# Patient Record
Sex: Male | Born: 1975 | Race: White | Hispanic: No | Marital: Married | Smoking: Never smoker
Health system: Southern US, Community
[De-identification: ages and names within clinical notes are randomized; demographics above are authoritative.]

## PROBLEM LIST (undated history)

## (undated) DIAGNOSIS — F988 Other specified behavioral and emotional disorders with onset usually occurring in childhood and adolescence: Secondary | ICD-10-CM

## (undated) DIAGNOSIS — F1020 Alcohol dependence, uncomplicated: Secondary | ICD-10-CM

## (undated) DIAGNOSIS — E291 Testicular hypofunction: Secondary | ICD-10-CM

## (undated) DIAGNOSIS — J3081 Allergic rhinitis due to animal (cat) (dog) hair and dander: Secondary | ICD-10-CM

## (undated) DIAGNOSIS — J40 Bronchitis, not specified as acute or chronic: Secondary | ICD-10-CM

## (undated) DIAGNOSIS — R569 Unspecified convulsions: Secondary | ICD-10-CM

## (undated) DIAGNOSIS — T7840XA Allergy, unspecified, initial encounter: Secondary | ICD-10-CM

## (undated) DIAGNOSIS — N469 Male infertility, unspecified: Secondary | ICD-10-CM

## (undated) DIAGNOSIS — I951 Orthostatic hypotension: Secondary | ICD-10-CM

## (undated) DIAGNOSIS — R45851 Suicidal ideations: Secondary | ICD-10-CM

## (undated) DIAGNOSIS — K859 Acute pancreatitis without necrosis or infection, unspecified: Secondary | ICD-10-CM

## (undated) DIAGNOSIS — G4730 Sleep apnea, unspecified: Secondary | ICD-10-CM

## (undated) DIAGNOSIS — F329 Major depressive disorder, single episode, unspecified: Secondary | ICD-10-CM

## (undated) DIAGNOSIS — K219 Gastro-esophageal reflux disease without esophagitis: Secondary | ICD-10-CM

## (undated) DIAGNOSIS — F32A Depression, unspecified: Secondary | ICD-10-CM

## (undated) DIAGNOSIS — R519 Headache, unspecified: Secondary | ICD-10-CM

## (undated) DIAGNOSIS — F40243 Fear of flying: Secondary | ICD-10-CM

## (undated) DIAGNOSIS — R7303 Prediabetes: Secondary | ICD-10-CM

## (undated) DIAGNOSIS — R002 Palpitations: Secondary | ICD-10-CM

## (undated) HISTORY — DX: Allergic rhinitis due to animal (cat) (dog) hair and dander: J30.81

## (undated) HISTORY — PX: EYE SURGERY: SHX253

## (undated) HISTORY — DX: Male infertility, unspecified: N46.9

## (undated) HISTORY — DX: Allergy, unspecified, initial encounter: T78.40XA

## (undated) HISTORY — PX: FRACTURE SURGERY: SHX138

## (undated) HISTORY — DX: Depression, unspecified: F32.A

## (undated) HISTORY — DX: Testicular hypofunction: E29.1

## (undated) HISTORY — DX: Acute pancreatitis without necrosis or infection, unspecified: K85.90

## (undated) HISTORY — DX: Fear of flying: F40.243

## (undated) HISTORY — DX: Prediabetes: R73.03

## (undated) HISTORY — DX: Major depressive disorder, single episode, unspecified: F32.9

## (undated) HISTORY — PX: UPPER GASTROINTESTINAL ENDOSCOPY: SHX188

## (undated) HISTORY — DX: Palpitations: R00.2

## (undated) HISTORY — DX: Orthostatic hypotension: I95.1

## (undated) HISTORY — DX: Morbid (severe) obesity due to excess calories: E66.01

---

## 2012-01-25 ENCOUNTER — Ambulatory Visit (INDEPENDENT_AMBULATORY_CARE_PROVIDER_SITE_OTHER): Payer: BC Managed Care – PPO | Admitting: Family Medicine

## 2012-01-25 ENCOUNTER — Encounter: Payer: Self-pay | Admitting: Family Medicine

## 2012-01-25 ENCOUNTER — Other Ambulatory Visit: Payer: Self-pay | Admitting: Family Medicine

## 2012-01-25 VITALS — BP 133/88 | HR 98 | Temp 98.8°F | Ht 73.0 in | Wt 305.0 lb

## 2012-01-25 DIAGNOSIS — N469 Male infertility, unspecified: Secondary | ICD-10-CM

## 2012-01-25 DIAGNOSIS — Z23 Encounter for immunization: Secondary | ICD-10-CM

## 2012-01-25 DIAGNOSIS — Z0000 Encounter for general adult medical examination without abnormal findings: Secondary | ICD-10-CM

## 2012-01-25 NOTE — Progress Notes (Signed)
Office Note 01/28/2012  CC:  Chief Complaint  Patient presents with  . Establish Care    conception issues    HPI:  Philip Daugherty is a 36 y.o. White male who is here to establish care and discuss conception problems/abnl semen analysis done 01/04/12. Patient's most recent primary MD: none Old records were (semen analysis that pt brought) reviewed prior to or during today's visit.  He denies any physical complaints. He is currently in his second marriage and he and his wife have been trying to get pregnant for 6-7 months now.  He and his first wife had no children and he says a sperm analysis he got when married to her showed low volume but he can't think of anything else.  He says she has since remarried and had a child with current husband.   Denies hx of injury to testicles, no known hx of testicular infection, no FH of male infertility. He worked on Agricultural consultant in [employer] but says whenever he was tested there he never showed high levels of radiation exposure.  Past Medical History  Diagnosis Date  . Asthma     childhood  . Depression     wellbutrin helped in the past  . Syncope due to orthostatic hypotension     d/c'd from [employer] due to this.  Says if Wt 235 lbs or more then this is not a problem  . Allergy to cats     takes claritin  . Palpitations     24h Holter.: sinus tachycardia  . Fear of flying     +insomnia; was on xanax regulary for long period and then abruptly stopped it and had w/drawal seizures.    History reviewed. No pertinent past surgical history.  Family History  Problem Relation Age of Onset  . Depression Mother     bipolar disorder  . Parkinsonism Mother   . Diabetes Father     History   Social History  . Marital Status: Married    Spouse Name: N/A    Number of Children: N/A  . Years of Education: N/A   Occupational History  . Not on file.   Social History Main Topics  . Smoking status: Never Smoker   . Smokeless tobacco: Never  Used  . Alcohol Use: Yes     has decreased since 01/04/12  . Drug Use: No  . Sexually Active: Not on file   Other Topics Concern  . Not on file   Social History Narrative   Married, second marriage.  No children.Orig from [place], spent time in [place], college in [place].Relocated from [place] to [place] again summer 2012, works as IT sales professional for [employer].No tobacco.  Alcohol: 3-4 bourbon drinks most days, cut back as of 12/2011.Exercise: sporadic (elliptical, walking).  No hx of drug use/abuse.    Outpatient Encounter Prescriptions as of 01/25/2012  Medication Sig Dispense Refill  . diphenhydrAMINE (BENADRYL) 50 MG capsule Take 50-100 mg by mouth at bedtime as needed.      . loratadine (CLARITIN) 10 MG tablet Take 10 mg by mouth daily as needed.        No Known Allergies  ROS Review of Systems  Constitutional: Negative for fever, chills, appetite change and fatigue.  HENT: Negative for ear pain, congestion, sore throat, neck stiffness and dental problem.   Eyes: Negative for discharge, redness and visual disturbance.  Respiratory: Negative for cough, chest tightness, shortness of breath and wheezing.   Cardiovascular: Negative for chest  pain, palpitations and leg swelling.  Gastrointestinal: Negative for nausea, vomiting, abdominal pain, diarrhea and blood in stool.  Genitourinary: Negative for dysuria, urgency, frequency, hematuria, flank pain and difficulty urinating.  Musculoskeletal: Negative for myalgias, back pain, joint swelling and arthralgias.  Skin: Negative for pallor and rash.  Neurological: Negative for dizziness, speech difficulty, weakness and headaches.  Hematological: Negative for adenopathy. Does not bruise/bleed easily.  Psychiatric/Behavioral: Negative for confusion and sleep disturbance. The patient is not nervous/anxious.     PE; Blood pressure 133/88, pulse 98, temperature 98.8 F (37.1 C), temperature source Temporal, height 6\' 1"  (1.854 m), weight 305  lb (138.347 kg), SpO2 96.00%. Gen: Alert, well appearing, obese white male.  Patient is oriented to person, place, time, and situation. ENT: Ears: EACs clear, normal epithelium.  TMs with good light reflex and landmarks bilaterally.  Eyes: no injection, icteris, swelling, or exudate.  EOMI, PERRLA. Nose: no drainage or turbinate edema/swelling.  No injection or focal lesion.  Mouth: lips without lesion/swelling.  Oral mucosa pink and moist.  Dentition intact and without obvious caries or gingival swelling.  Oropharynx without erythema, exudate, or swelling.  Neck - No masses or thyromegaly or limitation in range of motion CV: RRR, no m/r/g.   LUNGS: CTA bilat, nonlabored resps, good aeration in all lung fields. ABD: soft, NT, ND, BS normal.  No hepatospenomegaly or mass.  No bruits. EXT: no clubbing, cyanosis, or edema.  Genitals normal; both testes normal without tenderness, masses, hydroceles, varicoceles, erythema or swelling. Shaft normal, circumcised, meatus normal without discharge. No inguinal hernia noted. No inguinal lymphadenopathy.  Pertinent labs:  Semen analysis 01/04/12 at [facility] in [place] showed low volume, low concentration, low motility, low total count, and non-progressive motility.  He had 3% normal forms, 38% head defects, 33% mid piece defects, and 26% tail defects.    ASSESSMENT AND PLAN:   Infertility male Check testosterone level, LH level. Will send test results from today + semen analysis to a urologist and have him seen by them for further e/m. Referral ordered today.  Preventative health care Tdap given today.     Return for return as needed.

## 2012-01-28 ENCOUNTER — Encounter: Payer: Self-pay | Admitting: Family Medicine

## 2012-01-28 DIAGNOSIS — N469 Male infertility, unspecified: Secondary | ICD-10-CM | POA: Insufficient documentation

## 2012-01-28 DIAGNOSIS — Z0000 Encounter for general adult medical examination without abnormal findings: Secondary | ICD-10-CM | POA: Insufficient documentation

## 2012-01-28 DIAGNOSIS — E291 Testicular hypofunction: Secondary | ICD-10-CM | POA: Insufficient documentation

## 2012-01-28 LAB — PROLACTIN: Prolactin: 8.4 ng/mL (ref 2.1–17.1)

## 2012-01-28 NOTE — Assessment & Plan Note (Signed)
Check testosterone level, LH level. Will send test results from today + semen analysis to a urologist and have him seen by them for further e/m. Referral ordered today.

## 2012-01-28 NOTE — Assessment & Plan Note (Signed)
Tdap given today.

## 2012-05-27 ENCOUNTER — Encounter: Payer: Self-pay | Admitting: Family Medicine

## 2012-08-06 ENCOUNTER — Encounter: Payer: Self-pay | Admitting: Family Medicine

## 2012-08-06 ENCOUNTER — Ambulatory Visit (INDEPENDENT_AMBULATORY_CARE_PROVIDER_SITE_OTHER): Payer: BC Managed Care – PPO | Admitting: Family Medicine

## 2012-08-06 VITALS — BP 130/88 | HR 93 | Temp 98.3°F | Ht 73.0 in | Wt 314.0 lb

## 2012-08-06 DIAGNOSIS — J45909 Unspecified asthma, uncomplicated: Secondary | ICD-10-CM | POA: Insufficient documentation

## 2012-08-06 DIAGNOSIS — J209 Acute bronchitis, unspecified: Secondary | ICD-10-CM

## 2012-08-06 MED ORDER — PREDNISONE 20 MG PO TABS: ORAL_TABLET | ORAL | Status: DC

## 2012-08-06 MED ORDER — ALBUTEROL SULFATE HFA 108 (90 BASE) MCG/ACT IN AERS: 2.0000 | INHALATION_SPRAY | Freq: Four times a day (QID) | RESPIRATORY_TRACT | Status: DC | PRN

## 2012-08-06 MED ORDER — HYDROCODONE-HOMATROPINE 5-1.5 MG/5ML PO SYRP: ORAL_SOLUTION | ORAL | Status: DC

## 2012-08-06 NOTE — Progress Notes (Signed)
OFFICE NOTE  08/06/2012  CC:  Chief Complaint  Patient presents with  . URI    ? bronchitis, roadtrip to [place], symptoms began Saturday; cough, nasal congestion, chest tightness     HPI: Patient is a 36 y.o. Caucasian male who is here for respiratory symptoms. Pt presents complaining of respiratory symptoms for 5  days.  Primary symptoms are: nasal cong/runny nose, cough.  Worst symptoms seems to be the cough/chest tightness.  Lately the symptoms seem to be worsening.  He feels short of breath. Pertinent negatives: No fevers, no wheezing.  No pain in face or teeth.  No significant HA.  ST mild at most.   Symptoms made worse by being active.  Symptoms improved by laying down. Smoker? no Recent sick contact? No known  Muscle or joint aches? no Flu shot this season at least 2 wks ago? no  Additional ROS: no n/v/d or abdominal pain.  No rash.  No neck stiffness.   +Mild fatigue.  +Mild appetite loss.   Pertinent PMH:  Past Medical History  Diagnosis Date  . Asthma     childhood  . Depression     wellbutrin helped in the past  . Syncope due to orthostatic hypotension     d/c'd from [employer] due to this.  Says if Wt 235 lbs or more then this is not a problem  . Allergy to cats     takes claritin  . Palpitations     24h Holter.: sinus tachycardia  . Fear of flying     +insomnia; was on xanax regulary for long period and then abruptly stopped it and had w/drawal seizures.  . 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 Cassell Smiles)  . Hypogonadism male     MEDS:  Outpatient Prescriptions Prior to Visit  Medication Sig Dispense Refill  . diphenhydrAMINE (BENADRYL) 50 MG capsule Take 50-100 mg by mouth at bedtime as needed.      . loratadine (CLARITIN) 10 MG tablet Take 10 mg by mouth daily as needed.        PE: Blood pressure 130/88, pulse 93, temperature 98.3 F (36.8 C), temperature source Temporal, height 6\' 1"  (1.854 m), weight  314 lb (142.429 kg). VS: noted--normal. Gen: alert, NAD, NONTOXIC APPEARING. HEENT: eyes without injection, drainage, or swelling.  Ears: EACs clear, TMs with normal light reflex and landmarks.  Nose: Clear rhinorrhea, with some dried, crusty exudate adherent to mildly injected mucosa.  No purulent d/c.  No paranasal sinus TTP.  No facial swelling.  Throat and mouth without focal lesion.  No pharyngial swelling, erythema, or exudate.   Neck: supple, no LAD.   LUNGS: Diffuse insp rhonchi and trace exp wheezing.  Poor aeration on exp, with significant post-exhalation coughing. CV: Regular rhythm, tachy to 100-110, no m/r/g. EXT: no c/c/e SKIN: no rash  LAB: none today.  IMPRESSION AND PLAN:  Acute bronchitis Albuterol 2.5mg /atrovent 0.5mg  nebulizer treatment given today in office: improved aeration and less coughing after this.  HR down in the 80s after neb. Prednisone 40mg  qd x 5d, then 20mg  qd x 5d. Proair HFA 1-2 puffs q4-6h prn. Recommended nonsedating antihistamine +/- decongestant OTC prn for upper resp sx's. Hycodan 1-2 tsp q6h prn cough, #136ml, no RF.  Encouraged pt to return for flu vaccine when he is feeling well again.  FOLLOW UP: prn

## 2012-08-06 NOTE — Assessment & Plan Note (Addendum)
Albuterol 2.5mg /atrovent 0.5mg  nebulizer treatment given today in office: improved aeration and less coughing after this.  HR down in the 80s after neb. Prednisone 40mg  qd x 5d, then 20mg  qd x 5d. Proair HFA 1-2 puffs q4-6h prn. Recommended nonsedating antihistamine +/- decongestant OTC prn for upper resp sx's. Hycodan 1-2 tsp q6h prn cough, #153ml, no RF.

## 2012-08-08 ENCOUNTER — Other Ambulatory Visit: Payer: Self-pay | Admitting: *Deleted

## 2012-08-08 ENCOUNTER — Other Ambulatory Visit: Payer: Self-pay | Admitting: Family Medicine

## 2012-08-08 DIAGNOSIS — J209 Acute bronchitis, unspecified: Secondary | ICD-10-CM

## 2012-08-08 MED ORDER — HYDROCODONE-HOMATROPINE 5-1.5 MG/5ML PO SYRP: ORAL_SOLUTION | ORAL | Status: DC

## 2012-08-08 NOTE — Telephone Encounter (Signed)
RX faxed

## 2012-08-08 NOTE — Telephone Encounter (Signed)
Pt states his bronchitis is slowly getting better.  He has been using cough syrup and is having relief from cough.  Pt only has a little bit left and would like refill.  Please advise.

## 2012-08-11 ENCOUNTER — Telehealth: Payer: Self-pay | Admitting: *Deleted

## 2012-08-11 NOTE — Telephone Encounter (Addendum)
Pt is still having symptoms of bronchitis.  Symptoms are not getting worse, but he is still having the cough.  Cough is worse while he is up and moving.  He is OK laying flat or when he is using cough syrup.  He is using 120 mL every 2.5 days.  Pt would like refill on cough syrup and would like to know if he needs to continue prednisone at higher dose.   Please advise.

## 2012-08-11 NOTE — Telephone Encounter (Signed)
I would like him to d/c hycodan cough syrup and start tessalon perles 200mg  q8h prn, #30, RF x 1. I would like him to continue the steroids like I rx'd earlier--no increased dose recommended at this time. Make sure he is doing his ProAir inhaler 2 puffs qid prn--this will help significantly if he is not already doing this. I'll also add antibiotic: azithromycin 250mg , 2 tabs po qd x 1d, then 1 tab po qd x 4d, #6, no RF. Pls do eRx's for the new meds listed above.  -thx

## 2012-08-12 MED ORDER — BENZONATATE 200 MG PO CAPS
200.0000 mg | ORAL_CAPSULE | Freq: Three times a day (TID) | ORAL | Status: AC | PRN
Start: 2012-08-12 — End: 2012-09-12

## 2012-08-12 MED ORDER — AZITHROMYCIN 250 MG PO TABS: ORAL_TABLET | ORAL | Status: AC

## 2012-08-12 NOTE — Telephone Encounter (Signed)
Pt notified and RX's sent.

## 2012-08-12 NOTE — Telephone Encounter (Signed)
I have attempted to contact this patient by phone with the following results: left message to return my call on answering machine (home/mobile).  

## 2012-09-24 ENCOUNTER — Encounter: Payer: Self-pay | Admitting: Family Medicine

## 2012-09-24 ENCOUNTER — Ambulatory Visit (INDEPENDENT_AMBULATORY_CARE_PROVIDER_SITE_OTHER): Payer: BC Managed Care – PPO | Admitting: Family Medicine

## 2012-09-24 VITALS — BP 136/103 | HR 75 | Temp 98.2°F | Ht 73.0 in | Wt 310.0 lb

## 2012-09-24 DIAGNOSIS — J45909 Unspecified asthma, uncomplicated: Secondary | ICD-10-CM

## 2012-09-24 MED ORDER — LEVALBUTEROL TARTRATE 45 MCG/ACT IN AERO: 1.0000 | INHALATION_SPRAY | Freq: Four times a day (QID) | RESPIRATORY_TRACT | Status: DC | PRN

## 2012-09-24 MED ORDER — AZITHROMYCIN 250 MG PO TABS: ORAL_TABLET | ORAL | Status: DC

## 2012-09-24 MED ORDER — BECLOMETHASONE DIPROPIONATE 80 MCG/ACT IN AERS: 2.0000 | INHALATION_SPRAY | Freq: Two times a day (BID) | RESPIRATORY_TRACT | Status: DC

## 2012-09-24 MED ORDER — PREDNISONE 20 MG PO TABS: ORAL_TABLET | ORAL | Status: DC

## 2012-09-24 MED ORDER — HYDROCODONE-HOMATROPINE 5-1.5 MG/5ML PO SYRP: ORAL_SOLUTION | ORAL | Status: DC

## 2012-09-24 NOTE — Progress Notes (Signed)
OFFICE NOTE  09/24/2012  CC:  Chief Complaint  Patient presents with  . URI    chest congestion, taking Mucinex and robitussin without relief     HPI: Patient is a 36 y.o. Caucasian male who is here for ongoing respiratory complaints. I saw approx 2 mo ago for acute asthmatic bronchitis and treated him with a steroid taper and started him on ProAir HFA.  He improved but did not get completely well.  He then tried to do a cardio workout in the last week or so and ever since then he has felt worse--cough, chest tightness/heaviness, wheezing, mild SOB, lots of rhinorrhea.  NO fever, no face pain or HA, no hemoptysis.  The proair makes him feel too jittery and he can't sleep if he takes it hs, also causes nauseated feeling--mild, no vomiting.  He has been using it only rarely due to these side effects.  He is not a smoker, nor does he live with a smoker.  No others in his home with a cough/resp illness.  He has had his flu vaccine this season.  Pertinent PMH:  Past Medical History  Diagnosis Date  . Asthma     childhood  . Depression     wellbutrin helped in the past  . Syncope due to orthostatic hypotension     d/c'd from [employer] due to this.  Says if Wt 235 lbs or more then this is not a problem  . Allergy to cats     takes claritin  . Palpitations     24h Holter.: sinus tachycardia  . Fear of flying     +insomnia; was on xanax regulary for long period and then abruptly stopped it and had w/drawal seizures.  . 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 Cassell Smiles)  . Hypogonadism male     MEDS:  Outpatient Prescriptions Prior to Visit  Medication Sig Dispense Refill  . albuterol (PROAIR HFA) 108 (90 BASE) MCG/ACT inhaler Inhale 2 puffs into the lungs every 6 (six) hours as needed for wheezing.  1 Inhaler  1  . clomiPHENE (CLOMID) 50 MG tablet Take 50 mg by mouth daily.      . diphenhydrAMINE (BENADRYL) 50 MG capsule Take 50-100 mg by  mouth at bedtime as needed.      . loratadine (CLARITIN) 10 MG tablet Take 10 mg by mouth daily as needed.      . [DISCONTINUED] predniSONE (DELTASONE) 20 MG tablet 2 tabs po qd x 5d, then 1 tab po qd x 5 d  15 tablet  0   Last reviewed on 09/24/2012 11:23 AM by Jeoffrey Massed, MD  PE: Blood pressure 136/103, pulse 75, temperature 98.2 F (36.8 C), temperature source Temporal, height 6\' 1"  (1.854 m), weight 310 lb (140.615 kg). Gen: Alert, well appearing.  Patient is oriented to person, place, time, and situation. VS: noted--normal except diastolic bp a little high. HEENT: eyes without injection, drainage, or swelling.  Ears: EACs clear, TMs with normal light reflex and landmarks.  Nose: Clear rhinorrhea, with some dried, crusty exudate adherent to mildly injected mucosa.  No purulent d/c.  No paranasal sinus TTP.  No facial swelling.  Throat and mouth without focal lesion.  No pharyngial swelling, erythema, or exudate.   Neck: supple, no LAD.   LUNGS: diffuse insp rhonchi and exp wheezing that does not clear with coughing.  +Excessive coughing when exhaling forcefully, aeration mildly diminished, exp phase mildly prolonged,  nonlabored resps.   CV: RRR, no m/r/g. EXT: no c/c/e SKIN: no rash    IMPRESSION AND PLAN:  Asthmatic bronchitis Prednisone taper: 60 x 5, 40 x 5, 20 x 5, 10 x 5, then stop. Azithromycin x 5d. Start inhaled steroid: QVAR 80 mcg, 2 puffs bid. D/c albuterol inhaler due to side effects of tremors, insomnia, and nausea.  Will do trial of xopenex HFA, 2 puffs q6h prn. Continue claritin. Hycodan susp, 1-2 tsp po qhs prn cough, #180ml, no RF. F/u 10-14d.   An After Visit Summary was printed and given to the patient.  FOLLOW UP: 10-14d

## 2012-09-24 NOTE — Assessment & Plan Note (Signed)
Prednisone taper: 60 x 5, 40 x 5, 20 x 5, 10 x 5, then stop. Azithromycin x 5d. Start inhaled steroid: QVAR 80 mcg, 2 puffs bid. D/c albuterol inhaler due to side effects of tremors, insomnia, and nausea.  Will do trial of xopenex HFA, 2 puffs q6h prn. Continue claritin. Hycodan susp, 1-2 tsp po qhs prn cough, #161ml, no RF. F/u 10-14d.

## 2012-10-06 ENCOUNTER — Ambulatory Visit: Payer: BC Managed Care – PPO | Admitting: Family Medicine

## 2012-10-10 ENCOUNTER — Ambulatory Visit: Payer: BC Managed Care – PPO | Admitting: Family Medicine

## 2012-11-29 ENCOUNTER — Other Ambulatory Visit: Payer: Self-pay

## 2013-08-20 ENCOUNTER — Other Ambulatory Visit: Payer: Self-pay

## 2013-10-21 ENCOUNTER — Ambulatory Visit (INDEPENDENT_AMBULATORY_CARE_PROVIDER_SITE_OTHER): Payer: BC Managed Care – PPO | Admitting: Family Medicine

## 2013-10-21 ENCOUNTER — Encounter: Payer: Self-pay | Admitting: Family Medicine

## 2013-10-21 ENCOUNTER — Other Ambulatory Visit: Payer: Self-pay | Admitting: *Deleted

## 2013-10-21 VITALS — BP 134/89 | HR 88 | Temp 99.0°F | Resp 18 | Ht 73.0 in | Wt 318.0 lb

## 2013-10-21 DIAGNOSIS — M25561 Pain in right knee: Secondary | ICD-10-CM | POA: Insufficient documentation

## 2013-10-21 DIAGNOSIS — M25569 Pain in unspecified knee: Secondary | ICD-10-CM

## 2013-10-21 MED ORDER — HYDROCODONE-ACETAMINOPHEN 5-325 MG PO TABS: ORAL_TABLET | ORAL | Status: DC

## 2013-10-21 NOTE — Progress Notes (Signed)
OFFICE NOTE  10/21/2013  CC:  Chief Complaint  Patient presents with  . Knee Pain    right knee x two months      HPI: Patient is a 38 y.o. Caucasian male who is here for right knee pain. Onset about 2 mo ago, right knee started to pop and this is accompanied by pain.  Intensity varies.  Happens most consistently when he plants right foot and turns directions while walking.  Worse when going down stairs.  Location is lateral, sometimes posterolateral region.  No swelling noted.  No hip or back pain. Ice application helps.  Ibuprofen avg of once per day and he doesn't notice that it helps much.  Rest helps it. No injury to knee recalled.    Pertinent PMH:  Past Medical History  Diagnosis Date  . Asthma     childhood  . Depression     wellbutrin helped in the past  . Syncope due to orthostatic hypotension     d/c'd from [employer] due to this.  Says if Wt 235 lbs or more then this is not a problem  . Allergy to cats     takes claritin  . Palpitations     24h Holter.: sinus tachycardia  . Fear of flying     +insomnia; was on xanax regulary for long period and then abruptly stopped it and had w/drawal seizures.  . 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 Era Bumpers)  . Hypogonadism male   . Obesity, morbid, BMI 40.0-49.9    No past surgical history on file.  MEDS:  Outpatient Prescriptions Prior to Visit  Medication Sig Dispense Refill  . clomiPHENE (CLOMID) 50 MG tablet Take 50 mg by mouth daily.      . diphenhydrAMINE (BENADRYL) 50 MG capsule Take 50-100 mg by mouth at bedtime as needed.      . beclomethasone (QVAR) 80 MCG/ACT inhaler Inhale 2 puffs into the lungs 2 (two) times daily.  1 Inhaler  4  . Chorionic Gonadotropin (NOVAREL IM) Inject 1 mL into the muscle 2 (two) times a week.      Marland Kitchen HYDROcodone-homatropine (HYCODAN) 5-1.5 MG/5ML syrup 1-2 tsp po qhs prn cough  120 mL  0  . levalbuterol (XOPENEX HFA) 45 MCG/ACT inhaler  Inhale 1-2 puffs into the lungs every 6 (six) hours as needed for wheezing.  1 Inhaler  0  . loratadine (CLARITIN) 10 MG tablet Take 10 mg by mouth daily as needed.      Marland Kitchen azithromycin (ZITHROMAX) 250 MG tablet 2 tabs po qd x 1d, then 1 tab po qd x 4d  6 each  0  . predniSONE (DELTASONE) 20 MG tablet 3 tabs po qd x 5d, then 2 tabs po qd x 5d, then 1 tab po qd x 5d, then 1/2 tab po qd x 4d, then stop.  32 tablet  0   No facility-administered medications prior to visit.    PE: Blood pressure 134/89, pulse 88, temperature 99 F (37.2 C), temperature source Temporal, resp. rate 18, height 6\' 1"  (1.854 m), weight 318 lb (144.244 kg), SpO2 95.00%. Gen: Alert, well appearing.  Patient is oriented to person, place, time, and situation. No knee swelling, warmth, or erythema. Right knee ROM fully intact, with mild pain at the extremes of flexion and extension. No knee instability.  Patellar grind elicits no pain.  He has some crepitus of his right patella with flexion/extension. Tender to palpation along  lateral joint line of right knee.  McMurray's elicits significant lateral joint line pain, no palpable popping.   IMPRESSION AND PLAN:  Right knee pain x 2 mo. Lateral meniscus injury suspected. Continue ice, rest, and will get rx #20 vicodin 5/325 to use prn severe pain.  May stop NSAID at this point. Will get him to orthopedist as soon as possible for further eval and management.  FOLLOW UP: prn

## 2013-10-21 NOTE — Progress Notes (Signed)
Pre visit review using our clinic review tool, if applicable. No additional management support is needed unless otherwise documented below in the visit note. 

## 2014-03-19 ENCOUNTER — Encounter: Payer: Self-pay | Admitting: Family Medicine

## 2014-03-19 ENCOUNTER — Ambulatory Visit (INDEPENDENT_AMBULATORY_CARE_PROVIDER_SITE_OTHER): Payer: BC Managed Care – PPO | Admitting: Family Medicine

## 2014-03-19 VITALS — BP 129/85 | HR 83 | Temp 98.0°F | Resp 18 | Ht 73.0 in | Wt 316.0 lb

## 2014-03-19 DIAGNOSIS — F411 Generalized anxiety disorder: Secondary | ICD-10-CM

## 2014-03-19 DIAGNOSIS — F418 Other specified anxiety disorders: Secondary | ICD-10-CM | POA: Insufficient documentation

## 2014-03-19 MED ORDER — CLONAZEPAM 2 MG PO TABS: ORAL_TABLET | ORAL | Status: DC

## 2014-03-19 NOTE — Progress Notes (Signed)
OFFICE NOTE  03/19/2014  CC:  Chief Complaint  Patient presents with  . Anxiety    when he travels on the plane for work   HPI: Patient is a 38 y.o. Caucasian male who is here for situational anxiety--fear of flying. Has to fly for his job anywhere from 5-10 times a month, feels tightness in stomach, sweaty, heart racing, no dizziness. This abates when flight is over.  No other significant anxiety in his life that requires prn med. Currently is on no prn med for anxiety.  Xanax use in the past had GI drawbacks.  Pertinent PMH:  Past medical, surgical, social, and family history reviewed and no changes are noted since last office visit.  MEDS:  Claritin qd prn, xopenex HFA prn  PE: Blood pressure 129/85, pulse 83, temperature 98 F (36.7 C), temperature source Oral, resp. rate 18, height 6\' 1"  (1.854 m), weight 316 lb (143.337 kg), SpO2 96.00%. Gen: Alert, well appearing.  Patient is oriented to person, place, time, and situation. AFFECT: pleasant, lucid thought and speech.   IMPRESSION AND PLAN:  Situational anxiety, unavoidable. Wants to get back on prn med due to excessive travel required for work. Trial of clonazepam 1/2-1 tab q12h prn airplane flight, #30, RF x 5. F/u 6 mo. Signs/symptoms to call or return for were reviewed and pt expressed understanding.

## 2014-03-19 NOTE — Progress Notes (Signed)
Pre visit review using our clinic review tool, if applicable. No additional management support is needed unless otherwise documented below in the visit note. 

## 2014-03-24 ENCOUNTER — Telehealth: Payer: Self-pay | Admitting: Family Medicine

## 2014-03-24 NOTE — Telephone Encounter (Signed)
Pt called stating that klonopin doesn't seem to be helping him at 1 PO.   Please advise.

## 2014-03-24 NOTE — Telephone Encounter (Signed)
lmom for pt to CB °

## 2014-03-24 NOTE — Telephone Encounter (Signed)
Tell him to take 2 tabs at a time and see if this is sufficient dosing. Have him call back with report of how this does.-thx

## 2014-03-25 NOTE — Telephone Encounter (Signed)
Patient aware of instructions and will call back to check in about dosing sufficiency.

## 2014-05-28 ENCOUNTER — Ambulatory Visit (INDEPENDENT_AMBULATORY_CARE_PROVIDER_SITE_OTHER): Payer: BC Managed Care – PPO | Admitting: Family Medicine

## 2014-05-28 ENCOUNTER — Encounter: Payer: Self-pay | Admitting: Family Medicine

## 2014-05-28 VITALS — BP 139/94 | HR 86 | Temp 99.2°F | Resp 18 | Ht 73.0 in | Wt 313.0 lb

## 2014-05-28 DIAGNOSIS — F331 Major depressive disorder, recurrent, moderate: Secondary | ICD-10-CM

## 2014-05-28 MED ORDER — DULOXETINE HCL 60 MG PO CPEP: 60.0000 mg | ORAL_CAPSULE | Freq: Every day | ORAL | Status: DC

## 2014-05-28 MED ORDER — CLONAZEPAM 2 MG PO TABS: ORAL_TABLET | ORAL | Status: DC

## 2014-05-28 NOTE — Progress Notes (Signed)
Pre visit review using our clinic review tool, if applicable. No additional management support is needed unless otherwise documented below in the visit note. 

## 2014-05-28 NOTE — Assessment & Plan Note (Signed)
With a high generalized anxiety and social anxiety component to this. He says this is new compared to his episodes of depression in the past. Will do cymbalta 60mg  qd. Recommended he decrease his clonazepam use to once daily and at a maximum dose of ONE 2mg  tab when he does take it.   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

## 2014-05-28 NOTE — Progress Notes (Signed)
OFFICE VISIT  05/28/2014   CC:  Chief Complaint  Patient presents with  . Anxiety  . Depression   HPI:    Patient is a 38 y.o. Caucasian male who presents for discussion of a re-emergence of depression and anxiety. Irritable, tense, social anxiety noted much more lately, and on top of this he is feeling depressed, sad, hopeless. Cites loneliness as a problem, works from home and has lived her for 3 yrs yet doesn't really know anyone. Denies suicidal thoughts or homicidal thoughts but admits he has had thoughts like "who would miss me if I was gone" and "what if I could just end it now".  No plans, no past attempts. Denies hx of euphoric/manic episode in the past but says his mom is bipolar and has had these episodes so he knows what to look for. He does admit that his depression has worsened with use of clonazepam more for his acute anxiety lately.  He states he misunderstood my instructions and has been taking 2 of the 2 mg tabs at one dosing sometimes. Denies panic attacks.  No hallucinations or delusional thinking.  Past Medical History  Diagnosis Date  . Asthma     childhood  . Depression     wellbutrin helped in the past; pt cites failure to respond to prozac, zoloft, and celexa  . Syncope due to orthostatic hypotension     d/c'd from [employer] due to this.  Says if Wt 235 lbs or more then this is not a problem  . Allergy to cats     takes claritin  . Palpitations     24h Holter.: sinus tachycardia  . Fear of flying     +insomnia; was on xanax regulary for long period and then abruptly stopped it and had w/drawal seizures.  . 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 Era Bumpers)  . Hypogonadism male   . Obesity, morbid, BMI 40.0-49.9     No past surgical history on file.  Outpatient Prescriptions Prior to Visit  Medication Sig Dispense Refill  . diphenhydrAMINE (BENADRYL) 50 MG capsule Take 50-100 mg by mouth at bedtime as  needed.      . loratadine (CLARITIN) 10 MG tablet Take 10 mg by mouth daily as needed.      . clonazePAM (KLONOPIN) 2 MG tablet 1/2-1 tab po bid prn anxiety associated with flying  30 tablet  5  . levalbuterol (XOPENEX HFA) 45 MCG/ACT inhaler Inhale 1-2 puffs into the lungs every 6 (six) hours as needed for wheezing.  1 Inhaler  0  . beclomethasone (QVAR) 80 MCG/ACT inhaler Inhale 2 puffs into the lungs 2 (two) times daily.  1 Inhaler  4  . Chorionic Gonadotropin (NOVAREL IM) Inject 1 mL into the muscle 2 (two) times a week.      . clomiPHENE (CLOMID) 50 MG tablet Take 50 mg by mouth daily.      Marland Kitchen HYDROcodone-acetaminophen (NORCO/VICODIN) 5-325 MG per tablet 1-2 tabs po q6h prn moderate/severe pain  30 tablet  0  . HYDROcodone-homatropine (HYCODAN) 5-1.5 MG/5ML syrup 1-2 tsp po qhs prn cough  120 mL  0   No facility-administered medications prior to visit.    No Known Allergies  ROS As per HPI  PE: Blood pressure 139/94, pulse 86, temperature 99.2 F (37.3 C), temperature source Temporal, resp. rate 18, height 6\' 1"  (1.854 m), weight 313 lb (141.976 kg), SpO2 95.00%. Gen: Alert, well appearing, obese WM  in NAD.  Patient is oriented to person, place, time, and situation. AFFECT: pleasant, lucid thought and speech. XAJ:OINO: no injection, icteris, swelling, or exudate.  EOMI, PERRLA. Mouth: lips without lesion/swelling.  Oral mucosa pink and moist. Oropharynx without erythema, exudate, or swelling.  Neck - No masses or thyromegaly or limitation in range of motion CV: RRR, no m/r/g.   LUNGS: CTA bilat, nonlabored resps, good aeration in all lung fields. Neuro: CN 2-12 intact bilaterally, strength 5/5 in proximal and distal upper extremities and lower extremities bilaterally.    No tremor.  No disdiadochokinesis.  No ataxia.   No pronator drift.  LABS:  none  IMPRESSION AND PLAN:  Major depressive disorder, recurrent episode, moderate With a high generalized anxiety and social anxiety  component to this. He says this is new compared to his episodes of depression in the past. Will do cymbalta 60mg  qd. Recommended he decrease his clonazepam use to once daily and at a maximum dose of ONE 2mg  tab when he does take it.   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   An After Visit Summary was printed and given to the patient.  FOLLOW UP: Return in about 4 weeks (around 06/25/2014) for f/u dep/anx.

## 2014-06-28 ENCOUNTER — Ambulatory Visit: Payer: BC Managed Care – PPO | Admitting: Family Medicine

## 2014-08-04 ENCOUNTER — Other Ambulatory Visit: Payer: Self-pay | Admitting: Family Medicine

## 2014-08-04 MED ORDER — DULOXETINE HCL 60 MG PO CPEP: 60.0000 mg | ORAL_CAPSULE | Freq: Every day | ORAL | Status: DC

## 2014-08-04 NOTE — Telephone Encounter (Signed)
Pt requested rf of cymbalta.  RX sent into pharmacy # 30 no refills.  Called pt and LMOM stating that he needed an appointment for further refills.  He is over due for an appointment.  Okay to leave this message per DPR.

## 2014-09-08 ENCOUNTER — Other Ambulatory Visit: Payer: Self-pay | Admitting: Family Medicine

## 2014-09-08 MED ORDER — DULOXETINE HCL 60 MG PO CPEP: 60.0000 mg | ORAL_CAPSULE | Freq: Every day | ORAL | Status: DC

## 2014-09-08 NOTE — Telephone Encounter (Signed)
[name] stating that he needs refill of Cymbalta.  He has been told before that Dr. Anitra Lauth wanted a f/u before he would fill meds.   Pt aware and scheduled OV for next Wednesday.  RX sent into pharmacy # 15 w/ no refill.

## 2014-09-15 ENCOUNTER — Ambulatory Visit: Payer: BC Managed Care – PPO | Admitting: Family Medicine

## 2014-09-16 ENCOUNTER — Ambulatory Visit: Payer: BC Managed Care – PPO | Admitting: Family Medicine

## 2014-09-24 ENCOUNTER — Encounter: Payer: Self-pay | Admitting: Family Medicine

## 2014-09-24 ENCOUNTER — Ambulatory Visit (INDEPENDENT_AMBULATORY_CARE_PROVIDER_SITE_OTHER): Payer: BC Managed Care – PPO | Admitting: Family Medicine

## 2014-09-24 VITALS — BP 136/86 | HR 98 | Temp 97.7°F | Resp 18 | Ht 73.0 in | Wt 318.0 lb

## 2014-09-24 DIAGNOSIS — F331 Major depressive disorder, recurrent, moderate: Secondary | ICD-10-CM

## 2014-09-24 DIAGNOSIS — Z13220 Encounter for screening for lipoid disorders: Secondary | ICD-10-CM

## 2014-09-24 DIAGNOSIS — Z131 Encounter for screening for diabetes mellitus: Secondary | ICD-10-CM

## 2014-09-24 DIAGNOSIS — F418 Other specified anxiety disorders: Secondary | ICD-10-CM

## 2014-09-24 MED ORDER — LORAZEPAM 2 MG PO TABS: ORAL_TABLET | ORAL | Status: DC

## 2014-09-24 MED ORDER — DULOXETINE HCL 60 MG PO CPEP: 60.0000 mg | ORAL_CAPSULE | Freq: Every day | ORAL | Status: DC

## 2014-09-24 NOTE — Progress Notes (Signed)
Pre visit review using our clinic review tool, if applicable. No additional management support is needed unless otherwise documented below in the visit note. 

## 2014-09-24 NOTE — Progress Notes (Signed)
OFFICE NOTE  09/24/2014  CC:  Chief Complaint  Patient presents with  . Follow-up  . Medication Refill    cymbalta, wants to switch Klonopin to something else   HPI: Patient is a 38 y.o. Caucasian male who is here for 6 mo f/u depression and anxiety. Feeling very well on the cymbalta after some initial probs with GI side effects: feels like his depressed mood is in remission.  Says he "hates clonazepam" b/c he says it caused him to feel depressed.    Pertinent PMH:  Past medical, surgical, social, and family history reviewed and no changes are noted since last office visit.  MEDS:  Outpatient Prescriptions Prior to Visit  Medication Sig Dispense Refill  . diphenhydrAMINE (BENADRYL) 50 MG capsule Take 50-100 mg by mouth at bedtime as needed.    . DULoxetine (CYMBALTA) 60 MG capsule Take 1 capsule (60 mg total) by mouth daily. 15 capsule 0  . levalbuterol (XOPENEX HFA) 45 MCG/ACT inhaler Inhale 1-2 puffs into the lungs every 6 (six) hours as needed for wheezing. 1 Inhaler 0  . loratadine (CLARITIN) 10 MG tablet Take 10 mg by mouth daily as needed.    . clonazePAM (KLONOPIN) 2 MG tablet 1/2 - 1 tab po once daily as needed (Patient not taking: Reported on 09/24/2014) 30 tablet 5   No facility-administered medications prior to visit.    PE: Blood pressure 136/86, pulse 98, temperature 97.7 F (36.5 C), temperature source Oral, resp. rate 18, height 6\' 1"  (1.854 m), weight 318 lb (144.244 kg), SpO2 96 %. Gen: Alert, well appearing.  Patient is oriented to person, place, time, and situation. AFFECT: pleasant, lucid thought and speech. No further exam today.  IMPRESSION AND PLAN:  Depression and situational anxiety. Doing well on cymbalta: refills sent to pharmacy. D/C clonaz for situational anxiety and do trial of lorazepam 2mg , 1/2-1 tab po qd prn, #30, RF x 5. Pt requested to be screened for hyperlipidemia and diabetes so I ordered future CMET and FLP for when he can come in  fasting at his earliest convenience.  An After Visit Summary was printed and given to the patient.  FOLLOW UP: 66mo for CPE

## 2014-09-27 ENCOUNTER — Other Ambulatory Visit: Payer: BC Managed Care – PPO

## 2014-10-05 ENCOUNTER — Other Ambulatory Visit (INDEPENDENT_AMBULATORY_CARE_PROVIDER_SITE_OTHER): Payer: BC Managed Care – PPO

## 2014-10-05 DIAGNOSIS — Z13220 Encounter for screening for lipoid disorders: Secondary | ICD-10-CM

## 2014-10-05 DIAGNOSIS — Z131 Encounter for screening for diabetes mellitus: Secondary | ICD-10-CM

## 2014-10-05 LAB — COMPREHENSIVE METABOLIC PANEL
ALK PHOS: 71 U/L (ref 39–117)
ALT: 50 U/L (ref 0–53)
AST: 30 U/L (ref 0–37)
Albumin: 3.8 g/dL (ref 3.5–5.2)
BILIRUBIN TOTAL: 0.5 mg/dL (ref 0.2–1.2)
BUN: 12 mg/dL (ref 6–23)
CO2: 27 meq/L (ref 19–32)
CREATININE: 1.1 mg/dL (ref 0.4–1.5)
Calcium: 9.1 mg/dL (ref 8.4–10.5)
Chloride: 105 mEq/L (ref 96–112)
GFR: 81.31 mL/min (ref >60.00)
Glucose, Bld: 104 mg/dL — ABNORMAL HIGH (ref 70–99)
Potassium: 4.7 mEq/L (ref 3.5–5.1)
SODIUM: 138 meq/L (ref 135–145)
TOTAL PROTEIN: 6.6 g/dL (ref 6.0–8.3)

## 2014-10-05 LAB — LIPID PANEL
CHOL/HDL RATIO: 6
Cholesterol: 187 mg/dL (ref 0–200)
HDL: 33.3 mg/dL — AB (ref >39.00)
LDL Cholesterol: 129 mg/dL — ABNORMAL HIGH (ref 0–99)
NonHDL: 153.7
Triglycerides: 123 mg/dL (ref 0.0–149.0)
VLDL: 24.6 mg/dL (ref 0.0–40.0)

## 2014-11-19 ENCOUNTER — Telehealth: Payer: Self-pay | Admitting: Family Medicine

## 2014-11-19 MED ORDER — ALPRAZOLAM 1 MG PO TABS: ORAL_TABLET | ORAL | Status: DC

## 2014-11-19 NOTE — Telephone Encounter (Signed)
Pt LMOM requesting to switch from ativan to xanax.  He said the ativan makes him feel loopy all day when he takes it and he would rather just take something to work quick and wear off quick for when he has to go in a plane.  He said he will deal with the stomach issues the xanax gives him that he discussed with you previously.  Please advise.

## 2014-11-19 NOTE — Telephone Encounter (Signed)
Rx sent to pharmacy per pt request 

## 2014-11-19 NOTE — Telephone Encounter (Signed)
OK.  Alprazolam rx printed (1mg  tabs, 1-2 q8h prn, #30, RF x 2).

## 2015-05-16 ENCOUNTER — Ambulatory Visit: Payer: Self-pay | Admitting: Family Medicine

## 2015-05-31 ENCOUNTER — Other Ambulatory Visit: Payer: Self-pay | Admitting: *Deleted

## 2015-05-31 MED ORDER — ALPRAZOLAM 1 MG PO TABS: ORAL_TABLET | ORAL | Status: DC

## 2015-05-31 NOTE — Telephone Encounter (Signed)
Will do #30 alprazolam with 0 RF. Pt needs o/v for CPE prior to further RF's-thx

## 2015-05-31 NOTE — Telephone Encounter (Signed)
Pt advised and voiced understanding.  Apt made for 06/06/15 at 9:00am.

## 2015-05-31 NOTE — Telephone Encounter (Signed)
RF request for alprazolam LOV: 09/24/14 Next ov: None Last written: 11/19/14 #30 w/ 2RF Please advise. Thanks.

## 2015-06-06 ENCOUNTER — Encounter: Payer: Self-pay | Admitting: Family Medicine

## 2015-06-06 NOTE — Progress Notes (Deleted)
Office Note 06/06/2015  CC: No chief complaint on file.   HPI:  Philip Daugherty is a 39 y.o. {desc; ethnicity:[id number]} male who is here for CPE.   Past Medical History  Diagnosis Date  . Asthma     childhood  . Depression     wellbutrin helped in the past; pt cites failure to respond to prozac, zoloft, and celexa  . Syncope due to orthostatic hypotension     d/c'd from [employer] due to this.  Says if Wt 235 lbs or more then this is not a problem  . Allergy to cats     takes claritin  . Palpitations     24h Holter.: sinus tachycardia  . Fear of flying     +insomnia; was on xanax regulary for long period and then abruptly stopped it and had w/drawal seizures.  . 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 Era Bumpers)  . Hypogonadism male   . Obesity, morbid, BMI 40.0-49.9     No past surgical history on file.  Family History  Problem Relation Age of Onset  . Depression Mother     bipolar disorder  . Parkinsonism Mother   . Diabetes Father     Social History   Social History  . Marital Status: Married    Spouse Name: N/A  . Number of Children: N/A  . Years of Education: N/A   Occupational History  . Not on file.   Social History Main Topics  . Smoking status: Never Smoker   . Smokeless tobacco: Never Used  . Alcohol Use: Yes     Comment: has decreased since 01/04/12  . Drug Use: No  . Sexual Activity: Not on file   Other Topics Concern  . Not on file   Social History Narrative   Married, second marriage.  No children.   Orig from [place], spent time in [place], college in [place].   Relocated from [place] to [place] again summer 2012, works as Occupational psychologist for [employer].   No tobacco.  Alcohol: 3-4 bourbon drinks most days, cut back as of 12/2011.   Exercise: sporadic (elliptical, walking).  No hx of drug use/abuse.          Outpatient Prescriptions Prior to Visit  Medication Sig Dispense Refill  .  ALPRAZolam (XANAX) 1 MG tablet 1-2 tabs po q8h prn anxiety 30 tablet 0  . diphenhydrAMINE (BENADRYL) 50 MG capsule Take 50-100 mg by mouth at bedtime as needed.    . DULoxetine (CYMBALTA) 60 MG capsule Take 1 capsule (60 mg total) by mouth daily. 30 capsule 11  . levalbuterol (XOPENEX HFA) 45 MCG/ACT inhaler Inhale 1-2 puffs into the lungs every 6 (six) hours as needed for wheezing. 1 Inhaler 0  . loratadine (CLARITIN) 10 MG tablet Take 10 mg by mouth daily as needed.     No facility-administered medications prior to visit.    No Known Allergies  ROS *** PE; There were no vitals taken for this visit. *** Pertinent labs:  Lab Results  Component Value Date   TSH 2.231 01/25/2012   No results found for: WBC, HGB, HCT, MCV, PLT Lab Results  Component Value Date   CREATININE 1.1 10/05/2014   BUN 12 10/05/2014   NA 138 10/05/2014   K 4.7 10/05/2014   CL 105 10/05/2014   CO2 27 10/05/2014   Lab Results  Component Value Date   ALT 50 10/05/2014   AST 30  10/05/2014   ALKPHOS 71 10/05/2014   BILITOT 0.5 10/05/2014   Lab Results  Component Value Date   CHOL 187 10/05/2014   Lab Results  Component Value Date   HDL 33.30* 10/05/2014   Lab Results  Component Value Date   LDLCALC 129* 10/05/2014   Lab Results  Component Value Date   TRIG 123.0 10/05/2014   Lab Results  Component Value Date   CHOLHDL 6 10/05/2014   ASSESSMENT AND PLAN:   No problem-specific assessment & plan notes found for this encounter.   FOLLOW UP:  No Follow-up on file.

## 2015-08-15 ENCOUNTER — Ambulatory Visit (INDEPENDENT_AMBULATORY_CARE_PROVIDER_SITE_OTHER): Payer: BLUE CROSS/BLUE SHIELD | Admitting: Family Medicine

## 2015-08-15 ENCOUNTER — Encounter: Payer: Self-pay | Admitting: Family Medicine

## 2015-08-15 VITALS — BP 133/87 | HR 75 | Temp 98.6°F | Resp 16 | Ht 73.0 in | Wt 323.0 lb

## 2015-08-15 DIAGNOSIS — F418 Other specified anxiety disorders: Secondary | ICD-10-CM | POA: Diagnosis not present

## 2015-08-15 DIAGNOSIS — F331 Major depressive disorder, recurrent, moderate: Secondary | ICD-10-CM

## 2015-08-15 DIAGNOSIS — Z23 Encounter for immunization: Secondary | ICD-10-CM

## 2015-08-15 DIAGNOSIS — E291 Testicular hypofunction: Secondary | ICD-10-CM

## 2015-08-15 LAB — PSA: PSA: 0.23 ng/mL (ref 0.10–4.00)

## 2015-08-15 LAB — CBC WITH DIFFERENTIAL/PLATELET
Basophils Absolute: 0 10*3/uL (ref 0.0–0.1)
Basophils Relative: 0.3 % (ref 0.0–3.0)
Eosinophils Absolute: 0.1 10*3/uL (ref 0.0–0.7)
Eosinophils Relative: 1.1 % (ref 0.0–5.0)
HCT: 43.9 % (ref 39.0–52.0)
Hemoglobin: 14.5 g/dL (ref 13.0–17.0)
Lymphocytes Relative: 22.8 % (ref 12.0–46.0)
Lymphs Abs: 1.9 10*3/uL (ref 0.7–4.0)
MCHC: 33.1 g/dL (ref 30.0–36.0)
MCV: 88.2 fl (ref 78.0–100.0)
Monocytes Absolute: 0.5 10*3/uL (ref 0.1–1.0)
Monocytes Relative: 6.5 % (ref 3.0–12.0)
Neutro Abs: 5.6 10*3/uL (ref 1.4–7.7)
Neutrophils Relative %: 69.3 % (ref 43.0–77.0)
Platelets: 329 10*3/uL (ref 150.0–400.0)
RBC: 4.98 Mil/uL (ref 4.22–5.81)
RDW: 13.8 % (ref 11.5–15.5)
WBC: 8.1 10*3/uL (ref 4.0–10.5)

## 2015-08-15 LAB — TESTOSTERONE: Testosterone: 81.04 ng/dL — ABNORMAL LOW (ref 300.00–890.00)

## 2015-08-15 LAB — TSH: TSH: 1.36 u[IU]/mL (ref 0.35–4.50)

## 2015-08-15 LAB — LUTEINIZING HORMONE: LH: 3.71 m[IU]/mL (ref 1.50–9.30)

## 2015-08-15 MED ORDER — ALPRAZOLAM 1 MG PO TABS: ORAL_TABLET | ORAL | Status: DC

## 2015-08-15 NOTE — Progress Notes (Signed)
Pre visit review using our clinic review tool, if applicable. No additional management support is needed unless otherwise documented below in the visit note. 

## 2015-08-15 NOTE — Progress Notes (Signed)
OFFICE VISIT  08/15/2015   CC:  Chief Complaint  Patient presents with  . Follow-up    Pt is not fasting.    HPI:    Patient is a 39 y.o. Caucasian male who presents for 10 mo f/u anxiety and depression. Says cymbalta is helping him feel very stable mood-wise. Alprazolam use for flying on airplanes is helpful, needs RF.  Hx of male hypogonadism: feels chronic mild depression of energy level, has impaired sleep (takes otc meds for this almost nightly).  Has poor libido but is able to get erection once motivated. Dx'd with this since 2013 but due to some other med issues he hasn't addressed treating this but wants to do so now.   Past Medical History  Diagnosis Date  . Asthma     childhood  . Depression     wellbutrin helped in the past; pt cites failure to respond to prozac, zoloft, and celexa  . Syncope due to orthostatic hypotension     d/c'd from [employer] due to this.  Says if Wt 235 lbs or more then this is not a problem  . Allergy to cats     takes claritin  . Palpitations     24h Holter.: sinus tachycardia  . Fear of flying     +insomnia; was on xanax regulary for long period and then abruptly stopped it and had w/drawal seizures.  . 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 Era Bumpers)  . Hypogonadism male   . Obesity, morbid, BMI 40.0-49.9 ([place])     No past surgical history on file.  Outpatient Prescriptions Prior to Visit  Medication Sig Dispense Refill  . diphenhydrAMINE (BENADRYL) 50 MG capsule Take 50-100 mg by mouth at bedtime as needed.    . DULoxetine (CYMBALTA) 60 MG capsule Take 1 capsule (60 mg total) by mouth daily. 30 capsule 11  . loratadine (CLARITIN) 10 MG tablet Take 10 mg by mouth daily as needed.    . ALPRAZolam (XANAX) 1 MG tablet 1-2 tabs po q8h prn anxiety 30 tablet 0  . levalbuterol (XOPENEX HFA) 45 MCG/ACT inhaler Inhale 1-2 puffs into the lungs every 6 (six) hours as needed for wheezing.  (Patient not taking: Reported on 08/15/2015) 1 Inhaler 0   No facility-administered medications prior to visit.    No Known Allergies  ROS As per HPI  PE: Blood pressure 133/87, pulse 75, temperature 98.6 F (37 C), temperature source Oral, resp. rate 16, height 6\' 1"  (1.854 m), weight 323 lb (146.512 kg), SpO2 95 %. Gen: Alert, well appearing.  Patient is oriented to person, place, time, and situation. CV: RRR, no m/r/g.   LUNGS: CTA bilat, nonlabored resps, good aeration in all lung fields. EXT: no clubbing, cyanosis, or edema.    LABS:    Chemistry      Component Value Date/Time   NA 138 10/05/2014 0952   K 4.7 10/05/2014 0952   CL 105 10/05/2014 0952   CO2 27 10/05/2014 0952   BUN 12 10/05/2014 0952   CREATININE 1.1 10/05/2014 0952      Component Value Date/Time   CALCIUM 9.1 10/05/2014 0952   ALKPHOS 71 10/05/2014 0952   AST 30 10/05/2014 0952   ALT 50 10/05/2014 0952   BILITOT 0.5 10/05/2014 0952     Lab Results  Component Value Date   CHOL 187 10/05/2014   HDL 33.30* 10/05/2014   LDLCALC 129* 10/05/2014   TRIG 123.0 10/05/2014  CHOLHDL 6 10/05/2014   Lab Results  Component Value Date   TESTOSTERONE 154.33* 01/25/2012    IMPRESSION AND PLAN:  1) Depression: The current medical regimen is effective;  continue present plan and medications.  2) Anxiety, primarily situational (esp flying in airplanes): RF'd alprazolam today.  3) Male hypogonadism: recheck baseline testost level, also check prolactin, LH, and get baseline PSA and CBC. Once level is back confirming low T again then I'll send in rx for topical testost replacement.  Flu vaccine given today.  An After Visit Summary was printed and given to the patient.  FOLLOW UP: Return in about 1 month (around 09/14/2015) for f/u hypogonadism.

## 2015-08-16 LAB — PROLACTIN: PROLACTIN: 5.3 ng/mL (ref 2.1–17.1)

## 2015-08-17 ENCOUNTER — Other Ambulatory Visit: Payer: Self-pay | Admitting: Family Medicine

## 2015-08-17 ENCOUNTER — Encounter: Payer: Self-pay | Admitting: Family Medicine

## 2015-08-17 MED ORDER — TESTOSTERONE 20.25 MG/ACT (1.62%) TD GEL: TRANSDERMAL | Status: DC

## 2015-09-12 ENCOUNTER — Ambulatory Visit (INDEPENDENT_AMBULATORY_CARE_PROVIDER_SITE_OTHER): Payer: BLUE CROSS/BLUE SHIELD | Admitting: Family Medicine

## 2015-09-12 ENCOUNTER — Encounter: Payer: Self-pay | Admitting: Family Medicine

## 2015-09-12 VITALS — BP 122/84 | HR 75 | Temp 98.6°F | Resp 16 | Ht 73.0 in | Wt 322.0 lb

## 2015-09-12 DIAGNOSIS — E291 Testicular hypofunction: Secondary | ICD-10-CM | POA: Diagnosis not present

## 2015-09-12 NOTE — Progress Notes (Signed)
OFFICE VISIT  09/12/2015   CC:  Chief Complaint  Patient presents with  . Follow-up    Hypogonadism. Pt is not fasting.      HPI:    Patient is a 39 y.o. Caucasian male who presents for 1 mo f/u male hypogonadism.   Has been on testost gel for about 2 weeks now, last application was about 9 hours ago. He feels no different on this med at this time.   Past Medical History  Diagnosis Date  . Asthma     childhood  . Depression     wellbutrin helped in the past; pt cites failure to respond to prozac, zoloft, and celexa  . Syncope due to orthostatic hypotension     d/c'd from [employer] due to this.  Says if Wt 235 lbs or more then this is not a problem  . Allergy to cats     takes claritin  . Palpitations     24h Holter.: sinus tachycardia  . Fear of flying     +insomnia; was on xanax regulary for long period and then abruptly stopped it and had w/drawal seizures.  . 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 Era Bumpers)  . Hypogonadism male 2012 and 2016  . Obesity, morbid, BMI 40.0-49.9 ([place])     No past surgical history on file.  Outpatient Prescriptions Prior to Visit  Medication Sig Dispense Refill  . ALPRAZolam (XANAX) 1 MG tablet 1-2 tabs po q8h prn anxiety 30 tablet 5  . diphenhydrAMINE (BENADRYL) 50 MG capsule Take 50-100 mg by mouth at bedtime as needed.    . DULoxetine (CYMBALTA) 60 MG capsule Take 1 capsule (60 mg total) by mouth daily. 30 capsule 11  . loratadine (CLARITIN) 10 MG tablet Take 10 mg by mouth daily as needed.    . Testosterone 20.25 MG/ACT (1.62%) GEL 2 pumps applied to shoulders and upper arms every morning 75 g 5   No facility-administered medications prior to visit.    No Known Allergies  ROS As per HPI  PE: Blood pressure 122/84, pulse 75, temperature 98.6 F (37 C), temperature source Oral, resp. rate 16, height 6\' 1"  (1.854 m), weight 322 lb (146.058 kg), SpO2 94 %. Gen: Alert, well  appearing.  Patient is oriented to person, place, time, and situation. AFFECT: pleasant, lucid thought and speech. No further exam today.  LABS:  Lab Results  Component Value Date   TESTOSTERONE 81.04* 08/15/2015   Lab Results  Component Value Date   WBC 8.1 08/15/2015   HGB 14.5 08/15/2015   HCT 43.9 08/15/2015   MCV 88.2 08/15/2015   PLT 329.0 08/15/2015     IMPRESSION AND PLAN:  Male hypogonadism: On androgel standard dose (2 pumps) qd for about 2 wks now. He'll return tomorrow for lab visit to recheck testost level as close as he can to 4 hours after application of the gel.  An After Visit Summary was printed and given to the patient.  FOLLOW UP: Return in about 6 months (around 03/11/2016) for pt to get lab draw at his earliest convenience around 10 AM--testost level.  Next f/u o/v 6 mo.

## 2015-09-12 NOTE — Progress Notes (Signed)
Pre visit review using our clinic review tool, if applicable. No additional management support is needed unless otherwise documented below in the visit note. 

## 2015-09-13 ENCOUNTER — Other Ambulatory Visit (INDEPENDENT_AMBULATORY_CARE_PROVIDER_SITE_OTHER): Payer: BLUE CROSS/BLUE SHIELD

## 2015-09-13 DIAGNOSIS — E291 Testicular hypofunction: Secondary | ICD-10-CM | POA: Diagnosis not present

## 2015-09-14 ENCOUNTER — Other Ambulatory Visit: Payer: BLUE CROSS/BLUE SHIELD

## 2015-09-14 DIAGNOSIS — E291 Testicular hypofunction: Secondary | ICD-10-CM

## 2015-09-15 ENCOUNTER — Other Ambulatory Visit: Payer: Self-pay | Admitting: Family Medicine

## 2015-09-15 ENCOUNTER — Other Ambulatory Visit: Payer: Self-pay | Admitting: *Deleted

## 2015-09-15 DIAGNOSIS — E291 Testicular hypofunction: Secondary | ICD-10-CM

## 2015-09-15 LAB — TESTOSTERONE: TESTOSTERONE: 96 ng/dL — AB (ref 300–890)

## 2015-09-15 MED ORDER — TESTOSTERONE 20.25 MG/ACT (1.62%) TD GEL: TRANSDERMAL | Status: DC

## 2015-09-28 ENCOUNTER — Other Ambulatory Visit: Payer: Self-pay | Admitting: *Deleted

## 2015-09-28 MED ORDER — DULOXETINE HCL 60 MG PO CPEP: 60.0000 mg | ORAL_CAPSULE | Freq: Every day | ORAL | Status: DC

## 2015-09-28 NOTE — Telephone Encounter (Signed)
RF request for duloxetine LOV: 09/12/15 Next ov: None Last written: 09/24/14 #30 w/ 11RF

## 2015-10-11 ENCOUNTER — Telehealth: Payer: Self-pay | Admitting: *Deleted

## 2015-10-11 ENCOUNTER — Ambulatory Visit (INDEPENDENT_AMBULATORY_CARE_PROVIDER_SITE_OTHER): Payer: BLUE CROSS/BLUE SHIELD | Admitting: Family Medicine

## 2015-10-11 ENCOUNTER — Encounter: Payer: Self-pay | Admitting: Family Medicine

## 2015-10-11 VITALS — BP 109/68 | HR 54 | Temp 98.1°F | Resp 16 | Ht 73.0 in | Wt 325.0 lb

## 2015-10-11 DIAGNOSIS — L309 Dermatitis, unspecified: Secondary | ICD-10-CM | POA: Diagnosis not present

## 2015-10-11 MED ORDER — TRIAMCINOLONE ACETONIDE 0.025 % EX CREA: 1.0000 "application " | TOPICAL_CREAM | Freq: Two times a day (BID) | CUTANEOUS | Status: DC

## 2015-10-11 MED ORDER — TESTOSTERONE 20.25 MG/ACT (1.62%) TD GEL: TRANSDERMAL | Status: DC

## 2015-10-11 NOTE — Progress Notes (Signed)
Pre visit review using our clinic review tool, if applicable. No additional management support is needed unless otherwise documented below in the visit note. 

## 2015-10-11 NOTE — Telephone Encounter (Signed)
PA for Androgel increase approved. Please print new Rx for #150g which will be for 30 day supply. Thanks.

## 2015-10-11 NOTE — Telephone Encounter (Signed)
Androgel rx printed (150g), with 5 RFs.

## 2015-10-11 NOTE — Progress Notes (Signed)
OFFICE VISIT  10/11/2015   CC:  Chief Complaint  Patient presents with  . Rash    x 10 days, started after increase of androgel.   HPI:    Patient is a 39 y.o. Caucasian male who presents for rash. Onset of little pinkish or flesh colored papular lesions on both forearms and both lower legs approx 2 wks ago, not very long after increase in testost to 4 pumps on each upper leg of his androgel.  The rash is very itchy. Not taking claritin or benadryl.  No rash where he applies the androgel.  Uses latex gloves to apply it to body.    Past Medical History  Diagnosis Date  . Asthma     childhood  . Depression     wellbutrin helped in the past; pt cites failure to respond to prozac, zoloft, and celexa  . Syncope due to orthostatic hypotension     d/c'd from [employer] due to this.  Says if Wt 235 lbs or more then this is not a problem  . Allergy to cats     takes claritin  . Palpitations     24h Holter.: sinus tachycardia  . Fear of flying     +insomnia; was on xanax regulary for long period and then abruptly stopped it and had w/drawal seizures.  . 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 Era Bumpers)  . Hypogonadism male 2012 and 2016  . Obesity, morbid, BMI 40.0-49.9 ([place])     No past surgical history on file.  Outpatient Prescriptions Prior to Visit  Medication Sig Dispense Refill  . ALPRAZolam (XANAX) 1 MG tablet 1-2 tabs po q8h prn anxiety 30 tablet 5  . diphenhydrAMINE (BENADRYL) 50 MG capsule Take 50-100 mg by mouth at bedtime as needed.    . DULoxetine (CYMBALTA) 60 MG capsule Take 1 capsule (60 mg total) by mouth daily. 30 capsule 11  . loratadine (CLARITIN) 10 MG tablet Take 10 mg by mouth daily as needed.    . Testosterone 20.25 MG/ACT (1.62%) GEL 4 pumps applied to shoulders and upper arms every morning 75 g 5   No facility-administered medications prior to visit.    No Known Allergies  ROS As per  HPI  PE: Blood pressure 109/68, pulse 54, temperature 98.1 F (36.7 C), temperature source Oral, resp. rate 16, height 6\' 1"  (1.854 m), weight 325 lb (147.419 kg), SpO2 94 %. Gen: Alert, well appearing.  Patient is oriented to person, place, time, and situation. Forearms and lower legs with scattered pinkish/flesh colored papules with excoriated tops that are scabbed-over.  No vesicles or pustules or hives.  LABS:  none  IMPRESSION AND PLAN:  Rash/dermatitis, unknown etiology. Does not look infectious rash or drug rash.  Try daily claritin 10mg  and otc hydrocortisone ointment tid prn. If hydrocortisone not helping after a couple days then I sent in rx for triamcinolone 0.025% cream to apply to affected areas bid.  An After Visit Summary was printed and given to the patient.  FOLLOW UP: Return if symptoms worsen or fail to improve.

## 2015-10-11 NOTE — Telephone Encounter (Signed)
Opened in error

## 2015-10-12 NOTE — Telephone Encounter (Signed)
Rx faxed. Left detailed message on cell vm, okay per DPR.  

## 2016-01-28 DIAGNOSIS — J029 Acute pharyngitis, unspecified: Secondary | ICD-10-CM | POA: Diagnosis not present

## 2016-02-01 ENCOUNTER — Other Ambulatory Visit: Payer: BLUE CROSS/BLUE SHIELD

## 2016-02-01 ENCOUNTER — Ambulatory Visit (INDEPENDENT_AMBULATORY_CARE_PROVIDER_SITE_OTHER): Payer: BLUE CROSS/BLUE SHIELD | Admitting: Family Medicine

## 2016-02-01 ENCOUNTER — Encounter: Payer: Self-pay | Admitting: Family Medicine

## 2016-02-01 VITALS — BP 141/88 | HR 128 | Temp 101.4°F | Resp 16 | Ht 73.0 in | Wt 320.5 lb

## 2016-02-01 DIAGNOSIS — R739 Hyperglycemia, unspecified: Secondary | ICD-10-CM

## 2016-02-01 DIAGNOSIS — M2550 Pain in unspecified joint: Secondary | ICD-10-CM

## 2016-02-01 DIAGNOSIS — J029 Acute pharyngitis, unspecified: Secondary | ICD-10-CM

## 2016-02-01 DIAGNOSIS — R05 Cough: Secondary | ICD-10-CM | POA: Diagnosis not present

## 2016-02-01 DIAGNOSIS — R7309 Other abnormal glucose: Secondary | ICD-10-CM | POA: Diagnosis not present

## 2016-02-01 DIAGNOSIS — R059 Cough, unspecified: Secondary | ICD-10-CM

## 2016-02-01 DIAGNOSIS — R509 Fever, unspecified: Secondary | ICD-10-CM | POA: Diagnosis not present

## 2016-02-01 LAB — CBC WITH DIFFERENTIAL/PLATELET
BASOS PCT: 0.1 % (ref 0.0–3.0)
Basophils Absolute: 0 10*3/uL (ref 0.0–0.1)
EOS ABS: 0.1 10*3/uL (ref 0.0–0.7)
EOS PCT: 0.4 % (ref 0.0–5.0)
HEMATOCRIT: 44.9 % (ref 39.0–52.0)
HEMOGLOBIN: 15.2 g/dL (ref 13.0–17.0)
LYMPHS PCT: 8.2 % — AB (ref 12.0–46.0)
Lymphs Abs: 1 10*3/uL (ref 0.7–4.0)
MCHC: 33.8 g/dL (ref 30.0–36.0)
MCV: 85.9 fl (ref 78.0–100.0)
MONOS PCT: 10.1 % (ref 3.0–12.0)
Monocytes Absolute: 1.2 10*3/uL — ABNORMAL HIGH (ref 0.1–1.0)
NEUTROS ABS: 9.9 10*3/uL — AB (ref 1.4–7.7)
Neutrophils Relative %: 81.2 % — ABNORMAL HIGH (ref 43.0–77.0)
PLATELETS: 262 10*3/uL (ref 150.0–400.0)
RBC: 5.23 Mil/uL (ref 4.22–5.81)
RDW: 14.2 % (ref 11.5–15.5)
WBC: 12.3 10*3/uL — AB (ref 4.0–10.5)

## 2016-02-01 LAB — COMPREHENSIVE METABOLIC PANEL
ALBUMIN: 4.5 g/dL (ref 3.5–5.2)
ALK PHOS: 109 U/L (ref 39–117)
ALT: 59 U/L — ABNORMAL HIGH (ref 0–53)
AST: 28 U/L (ref 0–37)
BUN: 8 mg/dL (ref 6–23)
CALCIUM: 9.8 mg/dL (ref 8.4–10.5)
CHLORIDE: 100 meq/L (ref 96–112)
CO2: 25 mEq/L (ref 19–32)
Creatinine, Ser: 1.01 mg/dL (ref 0.40–1.50)
GFR: 87.24 mL/min (ref >60.00)
Glucose, Bld: 108 mg/dL — ABNORMAL HIGH (ref 70–99)
POTASSIUM: 4.2 meq/L (ref 3.5–5.1)
SODIUM: 137 meq/L (ref 135–145)
TOTAL PROTEIN: 7.9 g/dL (ref 6.0–8.3)
Total Bilirubin: 0.7 mg/dL (ref 0.2–1.2)

## 2016-02-01 LAB — MONONUCLEOSIS SCREEN: Mono Screen: NEGATIVE

## 2016-02-01 MED ORDER — OXYCODONE HCL 5 MG PO TABS: ORAL_TABLET | ORAL | Status: DC

## 2016-02-01 MED ORDER — CEFDINIR 300 MG PO CAPS: 300.0000 mg | ORAL_CAPSULE | Freq: Two times a day (BID) | ORAL | Status: DC

## 2016-02-01 NOTE — Progress Notes (Signed)
Pre visit review using our clinic review tool, if applicable. No additional management support is needed unless otherwise documented below in the visit note. 

## 2016-02-01 NOTE — Progress Notes (Signed)
OFFICE VISIT  02/01/2016   CC:  Chief Complaint  Patient presents with  . Sore Throat    x 10 days   HPI:    Patient is a 40 y.o. Caucasian male who presents for sore throat illness. Onset 10 d/a, ST, progressively worsening, pain in all joints but no swelling of joints, pain in ear canals, some HA, some loose BMs with eating. However, eating now so painful for his throat that he is not having any loose BMs.  PO intake way down.  Some occ cough.  No nausea.  No rash.  Says he feels like his anterior neck hurts.  Cant' breath through nose, so breathing through mouth feels a bit difficult.  Some "froggy" voice changes as well as scratchy changes.   Onset of fever last night subjective--Tm 99.1.   No tylenol or motrin today.  He went to an UC office about 5d/a and strep test was negative and he was rx'd viscous lidocaine for presumed viral pharyngitis. No records available at this time.   Past Medical History  Diagnosis Date  . Asthma     childhood  . Depression     wellbutrin helped in the past; pt cites failure to respond to prozac, zoloft, and celexa  . Syncope due to orthostatic hypotension     d/c'd from [employer] due to this.  Says if Wt 235 lbs or more then this is not a problem  . Allergy to cats     takes claritin  . Palpitations     24h Holter.: sinus tachycardia  . Fear of flying     +insomnia; was on xanax regulary for long period and then abruptly stopped it and had w/drawal seizures.  . 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 Era Bumpers)  . Hypogonadism male 2012 and 2016  . Obesity, morbid, BMI 40.0-49.9 ([place])     History reviewed. No pertinent past surgical history.  Outpatient Prescriptions Prior to Visit  Medication Sig Dispense Refill  . ALPRAZolam (XANAX) 1 MG tablet 1-2 tabs po q8h prn anxiety 30 tablet 5  . diphenhydrAMINE (BENADRYL) 50 MG capsule Take 50-100 mg by mouth at bedtime as needed.    .  DULoxetine (CYMBALTA) 60 MG capsule Take 1 capsule (60 mg total) by mouth daily. 30 capsule 11  . loratadine (CLARITIN) 10 MG tablet Take 10 mg by mouth daily as needed.    . triamcinolone (KENALOG) 0.025 % cream Apply 1 application topically 2 (two) times daily. 80 g 1  . Testosterone 20.25 MG/ACT (1.62%) GEL 4 pumps applied to shoulders and upper arms every morning (Patient not taking: Reported on 02/01/2016) 150 g 5   No facility-administered medications prior to visit.    No Known Allergies  ROS As per HPI  PE: Blood pressure 141/88, pulse 128, temperature 101.4 F (38.6 C), temperature source Oral, resp. rate 16, height 6\' 1"  (1.854 m), weight 320 lb 8 oz (145.378 kg), SpO2 94 %. Gen: Alert, ill- appearing but nontoxic, no NAD.  Patient is oriented to person, place, time, and situation. VS: noted--tachycardic and febrile Gen: alert, NAD, NONTOXIC APPEARING. HEENT: eyes without injection, drainage, or swelling.  Ears: EACs clear, TMs with normal light reflex and landmarks.  Nose: Clear rhinorrhea, with some dried, crusty exudate adherent to mildly injected mucosa.  No purulent d/c.  No paranasal sinus TTP.  No facial swelling.   Diffuse tonsillar, soft, palate, and posterior pharyngeal swelling, exudate, and  erythema, with a couple of superficial ulcerations on the uvula.  No asymmetry in throat exam or neck exam. Neck: supple, no distinctly palpable LAD.  He has soft tissue tenderness anteriorly. LUNGS: CTA bilat, nonlabored resps.   CV: RRR, no m/r/g. EXT: no c/c/e SKIN: no rash  LABS:  Rapid strep: neg  IMPRESSION AND PLAN:  Acute pharyngitis, worsening. No physical exam findings to suggest peritonsillar or retropharyngeal abscess. The palatal ulcers are certainly more supportive of dx of viral process, although the fact that he just started getting fevers in the last 24h is worrisome for acute bacterial superinfection. Will check CBC w/diff, monospot, and CMET and will send  swab for Group A strep culture.  Will check CXR and lateral soft tissue neck film, although pt indicated he was simply going home today to rest and would likely get the x-rays tomorrow. Will start cefdinir 300 mg bid x 10d. Oxycodone 5mg , 1-2 tabs q6h prn, #30 rx'd.  An After Visit Summary was printed and given to the patient.  FOLLOW UP: Return for if not signif improved in 2-3 d.  Signed:  Crissie Sickles, MD           02/01/2016

## 2016-02-02 ENCOUNTER — Other Ambulatory Visit: Payer: Self-pay | Admitting: Family Medicine

## 2016-02-02 ENCOUNTER — Ambulatory Visit
Admission: RE | Admit: 2016-02-02 | Discharge: 2016-02-02 | Disposition: A | Discharge status: Home / Self Care | Payer: BLUE CROSS/BLUE SHIELD | Source: Ambulatory Visit | Attending: Family Medicine | Admitting: Family Medicine

## 2016-02-02 DIAGNOSIS — M542 Cervicalgia: Secondary | ICD-10-CM

## 2016-02-02 DIAGNOSIS — R509 Fever, unspecified: Secondary | ICD-10-CM

## 2016-02-02 DIAGNOSIS — J029 Acute pharyngitis, unspecified: Secondary | ICD-10-CM | POA: Diagnosis not present

## 2016-02-02 LAB — PATHOLOGIST SMEAR REVIEW

## 2016-02-02 MED ORDER — IOPAMIDOL (ISOVUE-300) INJECTION 61%
75.0000 mL | Freq: Once | INTRAVENOUS | Status: AC | PRN
  Administered 2016-02-02: 75 mL via INTRAVENOUS

## 2016-02-03 LAB — CULTURE, GROUP A STREP: ORGANISM ID, BACTERIA: NORMAL

## 2016-02-27 DIAGNOSIS — J00 Acute nasopharyngitis [common cold]: Secondary | ICD-10-CM | POA: Diagnosis not present

## 2016-03-06 ENCOUNTER — Other Ambulatory Visit: Payer: Self-pay | Admitting: *Deleted

## 2016-03-06 MED ORDER — ALPRAZOLAM 1 MG PO TABS: ORAL_TABLET | ORAL | Status: DC

## 2016-03-06 NOTE — Telephone Encounter (Signed)
Pt LMOM on 03/06/16 at 8:48am stating that pharmacy refused to fill his Rx for alprazolam.   I spoke to pharmacy and they stated that Rx has expired and they need a new Rx.   RF request for alprazolam LOV: 08/15/15 Next ov: None Last written: 08/15/15 #30 w/ [id number]  Please advise. Thanks.

## 2016-03-06 NOTE — Telephone Encounter (Signed)
Rx approved/printed by Dr. Anitra Lauth but not signed before he left. Rx has been called into [facility] on [address] spoke to [place]. Printed Rx has been voided and put in shred box.

## 2016-03-06 NOTE — Telephone Encounter (Signed)
Pt advised and voiced understanding.   

## 2016-04-12 ENCOUNTER — Ambulatory Visit (INDEPENDENT_AMBULATORY_CARE_PROVIDER_SITE_OTHER): Payer: BLUE CROSS/BLUE SHIELD | Admitting: Family Medicine

## 2016-04-12 ENCOUNTER — Encounter: Payer: Self-pay | Admitting: Family Medicine

## 2016-04-12 VITALS — BP 132/82 | HR 84 | Temp 98.0°F | Resp 16 | Ht 73.0 in | Wt 326.8 lb

## 2016-04-12 DIAGNOSIS — J209 Acute bronchitis, unspecified: Secondary | ICD-10-CM

## 2016-04-12 DIAGNOSIS — E291 Testicular hypofunction: Secondary | ICD-10-CM | POA: Diagnosis not present

## 2016-04-12 DIAGNOSIS — R05 Cough: Secondary | ICD-10-CM | POA: Diagnosis not present

## 2016-04-12 DIAGNOSIS — Z3009 Encounter for other general counseling and advice on contraception: Secondary | ICD-10-CM

## 2016-04-12 DIAGNOSIS — Z309 Encounter for contraceptive management, unspecified: Secondary | ICD-10-CM | POA: Diagnosis not present

## 2016-04-12 DIAGNOSIS — R059 Cough, unspecified: Secondary | ICD-10-CM

## 2016-04-12 MED ORDER — ALPRAZOLAM 1 MG PO TABS
ORAL_TABLET | ORAL | Status: DC
Start: 2016-04-12 — End: 2016-11-11

## 2016-04-12 MED ORDER — "SYRINGE 22G X 1-1/2"" 3 ML MISC": Status: DC

## 2016-04-12 MED ORDER — DULOXETINE HCL 60 MG PO CPEP: 60.0000 mg | ORAL_CAPSULE | Freq: Every day | ORAL | Status: DC

## 2016-04-12 MED ORDER — TESTOSTERONE CYPIONATE 200 MG/ML IM SOLN: 200.0000 mg | INTRAMUSCULAR | Status: DC

## 2016-04-12 MED ORDER — BENZONATATE 200 MG PO CAPS: 200.0000 mg | ORAL_CAPSULE | Freq: Three times a day (TID) | ORAL | Status: DC | PRN

## 2016-04-12 NOTE — Progress Notes (Signed)
OFFICE VISIT  04/12/2016   CC:  Chief Complaint  Patient presents with  . Cough    x 2-3 months  . Follow-up    testosterone  . Referral    vasectomy   HPI:    Patient is a 40 y.o. Caucasian male who presents for f/u hypogonadism, desires referral to urologist for vasectomy, and also has a cough.  Cough started "a few months" ago right after a bad viral pharyngitis illness, initially just an annoying thing and it progressed to excessive productive cough--to the point of being lightheaded at times, with mild chest tightness and wheezing.  Some nasal mucous.  No HA or ST or fevers.  No malaise. Saw an MD at [facility] center in [place]: got rx'd amoxil, albut inhaler.  He says he thinks amoxil helped but albut did not.  No steroids were given.   He no longer feels/hears any wheezing. This was about 2 wks ago.  He feels better but the cough still nags him.  Cough is worse hs than daytime.  No longer has any wheezing or chest tightness.  Not taking otc cough med w/any regularity.  Testosterone replacement did not make him feel any better at all.  He started at 2 pumps qd and testost level came up minimally.  He went to 4 pumps and we did not have time to get a level check before he decided to self d/c this med.  He is interested in trying injections.  He asks for referral to urology today b/c he desires a vasectomy.  Past Medical History  Diagnosis Date  . Asthma     childhood  . Depression     wellbutrin helped in the past; pt cites failure to respond to prozac, zoloft, and celexa  . Syncope due to orthostatic hypotension     d/c'd from [employer] due to this.  Says if Wt 235 lbs or more then this is not a problem  . Allergy to cats     takes claritin  . Palpitations     24h Holter.: sinus tachycardia  . Fear of flying     +insomnia; was on xanax regulary for long period and then abruptly stopped it and had w/drawal seizures.  . 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 Era Bumpers)  . Hypogonadism male 2012 and 2016  . Obesity, morbid, BMI 40.0-49.9 ([place])     No past surgical history on file.  Outpatient Prescriptions Prior to Visit  Medication Sig Dispense Refill  . diphenhydrAMINE (BENADRYL) 50 MG capsule Take 50-100 mg by mouth at bedtime as needed.    . loratadine (CLARITIN) 10 MG tablet Take 10 mg by mouth daily as needed.    . ALPRAZolam (XANAX) 1 MG tablet 1-2 tabs po q8h prn anxiety 30 tablet 5  . DULoxetine (CYMBALTA) 60 MG capsule Take 1 capsule (60 mg total) by mouth daily. 30 capsule 11  . cefdinir (OMNICEF) 300 MG capsule Take 1 capsule (300 mg total) by mouth 2 (two) times daily. (Patient not taking: Reported on 04/12/2016) 20 capsule 0  . lidocaine (XYLOCAINE) 2 % solution Use as directed 10 mLs in the mouth or throat. Reported on 04/12/2016  0  . oxyCODONE (OXY IR/ROXICODONE) 5 MG immediate release tablet 1-2 tabs po q6h prn pain (Patient not taking: Reported on 04/12/2016) 30 tablet 0  . Testosterone 20.25 MG/ACT (1.62%) GEL 4 pumps applied to shoulders and upper arms every morning (Patient not  taking: Reported on 02/01/2016) 150 g 5  . triamcinolone (KENALOG) 0.025 % cream Apply 1 application topically 2 (two) times daily. (Patient not taking: Reported on 04/12/2016) 80 g 1   No facility-administered medications prior to visit.    No Known Allergies  ROS As per HPI  PE: Blood pressure 132/82, pulse 84, temperature 98 F (36.7 C), temperature source Oral, resp. rate 16, height 6\' 1"  (1.854 m), weight 326 lb 12 oz (148.213 kg), SpO2 95 %. Gen: Alert, well appearing.  Patient is oriented to person, place, time, and situation. ENT: Ears: EACs clear, normal epithelium.  TMs with good light reflex and landmarks bilaterally.  Eyes: no injection, icteris, swelling, or exudate.  EOMI, PERRLA. Nose: no drainage or turbinate edema/swelling.  No injection or focal lesion.  Mouth: lips without lesion/swelling.  Oral mucosa pink  and moist.  Dentition intact and without obvious caries or gingival swelling.  Oropharynx without erythema, exudate, or swelling.  Neck - No masses or thyromegaly or limitation in range of motion CV: RRR, no m/r/g.   LUNGS: CTA bilat, nonlabored resps, good aeration in all lung fields.  He had a slight cough after one of his forced exhalation maneuvers.   EXT: no clubbing, cyanosis, or edema.    LABS:  Lab Results  Component Value Date   TSH 1.36 08/15/2015   Lab Results  Component Value Date   WBC 12.3* 02/01/2016   HGB 15.2 02/01/2016   HCT 44.9 02/01/2016   MCV 85.9 02/01/2016   PLT 262.0 02/01/2016   Lab Results  Component Value Date   CREATININE 1.01 02/01/2016   BUN 8 02/01/2016   NA 137 02/01/2016   K 4.2 02/01/2016   CL 100 02/01/2016   CO2 25 02/01/2016   Lab Results  Component Value Date   ALT 59* 02/01/2016   AST 28 02/01/2016   ALKPHOS 109 02/01/2016   BILITOT 0.7 02/01/2016   Lab Results  Component Value Date   CHOL 187 10/05/2014   Lab Results  Component Value Date   HDL 33.30* 10/05/2014   Lab Results  Component Value Date   LDLCALC 129* 10/05/2014   Lab Results  Component Value Date   TRIG 123.0 10/05/2014   Lab Results  Component Value Date   CHOLHDL 6 10/05/2014   Lab Results  Component Value Date   PSA 0.23 08/15/2015   Lab Results  Component Value Date   TESTOSTERONE 96* 09/14/2015   IMPRESSION AND PLAN:  1) Acute bronchitis, suspect viral etiology, gradually resolving.   We discussed the possibility of a burst of systemic steroids but in the end I felt like this was likely more risk than potential reward.  No abx necessary, and we discussed why and he expressed understanding.   Watchful waiting approach will be taken, and I did rx tessalon perles 200mg  to take 1 q8h prn.  2) Hypogonadism: no response to androgel trial at 4 pumps per day dosing. I'm willing to try testosteron cipionate injections: 200 mg q2 wks. We'll see how  he does after 4 injections or so---recheck in office and get testost level at midway point between his injections.  He wants to do these injections at home: says his wife will give it.  Today my CMA went over the injection technique with the patient while he was here.  3) Pt desires vasectomy: referred to [facility] urology today for this.  An After Visit Summary was printed and given to the patient.  FOLLOW UP:  Return in about 7 weeks (around 05/31/2016) for f/u hypogonadism.  Signed:  Crissie Sickles, MD           04/12/2016

## 2016-04-12 NOTE — Addendum Note (Signed)
Addended by: Onalee Hua on: 04/12/2016 03:44 PM   Modules accepted: Orders

## 2016-04-12 NOTE — Progress Notes (Signed)
Pre visit review using our clinic review tool, if applicable. No additional management support is needed unless otherwise documented below in the visit note. 

## 2016-05-17 DIAGNOSIS — K852 Alcohol induced acute pancreatitis without necrosis or infection: Secondary | ICD-10-CM | POA: Diagnosis not present

## 2016-05-17 DIAGNOSIS — E669 Obesity, unspecified: Secondary | ICD-10-CM | POA: Diagnosis not present

## 2016-05-17 DIAGNOSIS — R1013 Epigastric pain: Secondary | ICD-10-CM | POA: Diagnosis not present

## 2016-05-17 DIAGNOSIS — K858 Other acute pancreatitis without necrosis or infection: Secondary | ICD-10-CM | POA: Diagnosis not present

## 2016-05-17 DIAGNOSIS — R509 Fever, unspecified: Secondary | ICD-10-CM | POA: Diagnosis not present

## 2016-05-17 DIAGNOSIS — K859 Acute pancreatitis without necrosis or infection, unspecified: Secondary | ICD-10-CM

## 2016-05-17 HISTORY — DX: Acute pancreatitis without necrosis or infection, unspecified: K85.90

## 2016-05-18 DIAGNOSIS — K852 Alcohol induced acute pancreatitis without necrosis or infection: Secondary | ICD-10-CM | POA: Diagnosis not present

## 2016-05-18 DIAGNOSIS — R509 Fever, unspecified: Secondary | ICD-10-CM | POA: Diagnosis not present

## 2016-05-18 DIAGNOSIS — E669 Obesity, unspecified: Secondary | ICD-10-CM | POA: Diagnosis not present

## 2016-05-18 DIAGNOSIS — R1013 Epigastric pain: Secondary | ICD-10-CM | POA: Diagnosis not present

## 2016-05-18 DIAGNOSIS — K858 Other acute pancreatitis without necrosis or infection: Secondary | ICD-10-CM | POA: Diagnosis not present

## 2016-05-21 ENCOUNTER — Telehealth: Payer: Self-pay | Admitting: Family Medicine

## 2016-05-21 NOTE — Telephone Encounter (Signed)
Can you please advise?

## 2016-05-21 NOTE — Telephone Encounter (Signed)
Patient calling to report he was admitted to the [facility] in [place], [place] on last Thursday(05/17/16) for pancreatitis.  He was discharged on Saturday(05/19/16).  Patient is scheduled to see PCP on 05/23/16 for a hospital followup.  However, he will run out of meds today.  He is requesting refills of:  Ondansetron 8 mg and Oxycodone 10mg .  Pharmacy:  [place].

## 2016-05-21 NOTE — Telephone Encounter (Signed)
I would need to see him and have records to prescribe a controlled substances.

## 2016-05-21 NOTE — Telephone Encounter (Signed)
Records were requested from Hospital.  ( we were told that they would be here in two hours )

## 2016-05-22 ENCOUNTER — Encounter: Payer: Self-pay | Admitting: Family Medicine

## 2016-05-22 NOTE — Telephone Encounter (Signed)
Noted  

## 2016-05-22 NOTE — Telephone Encounter (Signed)
LMOM for pt to CB to schedule.

## 2016-05-23 ENCOUNTER — Encounter: Payer: Self-pay | Admitting: Family Medicine

## 2016-05-23 ENCOUNTER — Ambulatory Visit (INDEPENDENT_AMBULATORY_CARE_PROVIDER_SITE_OTHER): Payer: BLUE CROSS/BLUE SHIELD | Admitting: Family Medicine

## 2016-05-23 VITALS — BP 122/90 | HR 76 | Temp 98.2°F | Resp 18 | Ht 73.0 in | Wt 314.0 lb

## 2016-05-23 DIAGNOSIS — K859 Acute pancreatitis without necrosis or infection, unspecified: Secondary | ICD-10-CM

## 2016-05-23 LAB — LIPID PANEL
Cholesterol: 166 mg/dL (ref 0–200)
HDL: 29.6 mg/dL — AB (ref >39.00)
LDL Cholesterol: 107 mg/dL — ABNORMAL HIGH (ref 0–99)
NonHDL: 136.33
TRIGLYCERIDES: 149 mg/dL (ref 0.0–149.0)
Total CHOL/HDL Ratio: 6
VLDL: 29.8 mg/dL (ref 0.0–40.0)

## 2016-05-23 LAB — LIPASE: Lipase: 227 U/L — ABNORMAL HIGH (ref 11.0–59.0)

## 2016-05-23 LAB — CBC WITH DIFFERENTIAL/PLATELET
Basophils Absolute: 0 K/uL (ref 0.0–0.1)
Basophils Relative: 0.4 % (ref 0.0–3.0)
Eosinophils Absolute: 0.2 K/uL (ref 0.0–0.7)
Eosinophils Relative: 2.4 % (ref 0.0–5.0)
HCT: 45.9 % (ref 39.0–52.0)
Hemoglobin: 15.1 g/dL (ref 13.0–17.0)
Lymphocytes Relative: 24.5 % (ref 12.0–46.0)
Lymphs Abs: 1.7 K/uL (ref 0.7–4.0)
MCHC: 33 g/dL (ref 30.0–36.0)
MCV: 88.7 fl (ref 78.0–100.0)
Monocytes Absolute: 0.5 K/uL (ref 0.1–1.0)
Monocytes Relative: 6.5 % (ref 3.0–12.0)
Neutro Abs: 4.6 K/uL (ref 1.4–7.7)
Neutrophils Relative %: 66.2 % (ref 43.0–77.0)
Platelets: 371 K/uL (ref 150.0–400.0)
RBC: 5.17 Mil/uL (ref 4.22–5.81)
RDW: 13.2 % (ref 11.5–15.5)
WBC: 6.9 K/uL (ref 4.0–10.5)

## 2016-05-23 LAB — COMPREHENSIVE METABOLIC PANEL
ALT: 57 U/L — ABNORMAL HIGH (ref 0–53)
AST: 48 U/L — AB (ref 0–37)
Albumin: 4.5 g/dL (ref 3.5–5.2)
Alkaline Phosphatase: 80 U/L (ref 39–117)
BUN: 14 mg/dL (ref 6–23)
CALCIUM: 9.5 mg/dL (ref 8.4–10.5)
CHLORIDE: 103 meq/L (ref 96–112)
CO2: 24 meq/L (ref 19–32)
CREATININE: 0.95 mg/dL (ref 0.40–1.50)
GFR: 93.48 mL/min (ref >60.00)
Glucose, Bld: 103 mg/dL — ABNORMAL HIGH (ref 70–99)
POTASSIUM: 4.6 meq/L (ref 3.5–5.1)
SODIUM: 136 meq/L (ref 135–145)
Total Bilirubin: 0.3 mg/dL (ref 0.2–1.2)
Total Protein: 7.6 g/dL (ref 6.0–8.3)

## 2016-05-23 MED ORDER — ONDANSETRON 8 MG PO TBDP: 8.0000 mg | ORAL_TABLET | Freq: Three times a day (TID) | ORAL | 0 refills | Status: DC | PRN

## 2016-05-23 MED ORDER — OXYCODONE HCL 10 MG PO TABS: ORAL_TABLET | ORAL | 0 refills | Status: DC

## 2016-05-23 NOTE — Progress Notes (Signed)
OFFICE VISIT  05/23/2016   CC:  Chief Complaint  Patient presents with  . Hospitalization Follow-up    pancreatitis  . Medication Refill    oxycodone 10mg , zofran 8mg    HPI:    Patient is a 40 y.o. Caucasian male who presents for hospital f/u for pancreatitis.  He was admitted to a hosp in [place] while traveling, 05/17/2016.  Reviewed records today.  He had been drinking "quite a few drinks" about 4 days prior to his illness. W/u for GB dz was neg--a peripancreatic lymph node was biopsied.  He was d/c'd home with oxycodone and zofran. His lipase at admission was 7624.  At d/c his lipase was 1803.  He is still having abd pain, nausea, and subj fevers.  No vomiting.  Pain in epigastric an RUQ region. Asks for more pain med and zofran. He is eating a normal diet: (subs, lasagna) and has been doing this since d/c from hospital on 05/18/16. Pain intensity 4/10 compared to 8/10 at time of admission per pt report today. Says he is passing gas, having BMs but started taking stool softener yesterday b/c bm's hard/contipated.  Past Medical History:  Diagnosis Date  . Acute pancreatitis 05/17/2016   Admitted to hosp while out of town in [place].  Likely secondary to ETOH.  GB/stone w/u NEG.  . Allergy to cats    takes claritin  . Asthma    childhood  . Depression    wellbutrin helped in the past; pt cites failure to respond to prozac, zoloft, and celexa  . Fear of flying    +insomnia; was on xanax regulary for long period and then abruptly stopped it and had w/drawal seizures.  . Hypogonadism male 2012 and 2016  . 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 Era Bumpers)  . Obesity, morbid, BMI 40.0-49.9 ([place])   . Palpitations    24h Holter.: sinus tachycardia  . Syncope due to orthostatic hypotension    d/c'd from [employer] due to this.  Says if Wt 235 lbs or more then this is not a problem    History reviewed. No pertinent surgical  history.  Outpatient Medications Prior to Visit  Medication Sig Dispense Refill  . ALPRAZolam (XANAX) 1 MG tablet 1-2 tabs po q8h prn anxiety 30 tablet 5  . benzonatate (TESSALON) 200 MG capsule Take 1 capsule (200 mg total) by mouth 3 (three) times daily as needed for cough. 30 capsule 1  . diphenhydrAMINE (BENADRYL) 50 MG capsule Take 50-100 mg by mouth at bedtime as needed.    . DULoxetine (CYMBALTA) 60 MG capsule Take 1 capsule (60 mg total) by mouth daily. 30 capsule 11  . loratadine (CLARITIN) 10 MG tablet Take 10 mg by mouth daily as needed.    . testosterone cypionate (DEPOTESTOSTERONE CYPIONATE) 200 MG/ML injection Inject 1 mL (200 mg total) into the muscle every 14 (fourteen) days. 10 mL 0  . Syringe/Needle, Disp, (SYRINGE 3CC/22GX1-1/2") 22G X 1-1/2" 3 ML MISC Use as directed for IM injection 50 each 1   No facility-administered medications prior to visit.     No Known Allergies  ROS As per HPI  PE: Blood pressure 122/90, pulse 76, temperature 98.2 F (36.8 C), temperature source Oral, resp. rate 18, height 6\' 1"  (1.854 m), weight (!) 314 lb (142.4 kg), SpO2 96 %. Gen: Alert, well appearing.  Patient is oriented to person, place, time, and situation. AFFECT: pleasant, lucid thought and speech. CV: RRR, no  m/r/g.   LUNGS: CTA bilat, nonlabored resps, good aeration in all lung fields. ABD: soft, rotund but nondistended.  BS normal.  No HSM or mass or bruit.  He is TTP in mid epigastric and RUQ regions.  No guarding or rebound tenderness.  LABS:  Lab Results  Component Value Date   TESTOSTERONE 96 (L) 09/14/2015   Lab Results  Component Value Date   CHOL 187 10/05/2014   HDL 33.30 (L) 10/05/2014   LDLCALC 129 (H) 10/05/2014   TRIG 123.0 10/05/2014   CHOLHDL 6 10/05/2014    IMPRESSION AND PLAN:  Acute pancreatitis, suspect alcohol was the cause even though there was a 4d delay. Encouraged abstinence and he is agreeable to this. Will work on getting path from [place] done by GI MD in hospital in [place]. Go to full liquid diet now, RF oxycodone 10mg , 1-2 q6h prn, #60.  Also RF zofran. Recheck CBC, CMET, lipase, and FLP today (r/o hypertrig). Gradually advance diet after 24-36h of liquid diet.  An After Visit Summary was printed and given to the patient.  FOLLOW UP: Return if symptoms worsen or fail to improve.  Signed:  Crissie Sickles, MD           05/23/2016

## 2016-05-24 ENCOUNTER — Telehealth: Payer: Self-pay | Admitting: Family Medicine

## 2016-05-24 NOTE — Telephone Encounter (Signed)
Left detailed message on cell vm, okay per DPR.  

## 2016-05-24 NOTE — Telephone Encounter (Signed)
Pls notify pt that we got the pathology results from the lymph node that he had biopsied in [place] and it was BENIGN.  Reassure pt.-thx

## 2016-05-29 ENCOUNTER — Telehealth: Payer: Self-pay | Admitting: Family Medicine

## 2016-05-29 NOTE — Telephone Encounter (Signed)
Patient requesting RF of zofran and oxycodone.  Patient states he is not out yet but wanted to see about getting another refill.   Please advise.

## 2016-05-30 MED ORDER — ONDANSETRON 8 MG PO TBDP: 8.0000 mg | ORAL_TABLET | Freq: Three times a day (TID) | ORAL | 0 refills | Status: DC | PRN

## 2016-05-30 MED ORDER — OXYCODONE HCL 10 MG PO TABS: ORAL_TABLET | ORAL | 0 refills | Status: DC

## 2016-05-30 NOTE — Telephone Encounter (Signed)
Zofran eRx'd. Oxycodone printed. Tell pt that he should be able to start weening off the pain meds at this time.

## 2016-05-30 NOTE — Telephone Encounter (Signed)
Left detailed message on pt's cell.   Okay per DPR.  Rx at front desk.

## 2016-08-28 DIAGNOSIS — J069 Acute upper respiratory infection, unspecified: Secondary | ICD-10-CM | POA: Diagnosis not present

## 2016-09-13 ENCOUNTER — Ambulatory Visit (INDEPENDENT_AMBULATORY_CARE_PROVIDER_SITE_OTHER): Payer: BLUE CROSS/BLUE SHIELD | Admitting: Family Medicine

## 2016-09-13 ENCOUNTER — Encounter: Payer: Self-pay | Admitting: Family Medicine

## 2016-09-13 VITALS — BP 122/89 | HR 81 | Temp 98.1°F | Resp 16 | Ht 73.0 in | Wt 319.5 lb

## 2016-09-13 DIAGNOSIS — J4521 Mild intermittent asthma with (acute) exacerbation: Secondary | ICD-10-CM

## 2016-09-13 DIAGNOSIS — Z23 Encounter for immunization: Secondary | ICD-10-CM | POA: Diagnosis not present

## 2016-09-13 DIAGNOSIS — J069 Acute upper respiratory infection, unspecified: Secondary | ICD-10-CM

## 2016-09-13 MED ORDER — HYDROCODONE-HOMATROPINE 5-1.5 MG/5ML PO SYRP: ORAL_SOLUTION | ORAL | 0 refills | Status: DC

## 2016-09-13 MED ORDER — DOXYCYCLINE HYCLATE 100 MG PO TABS
100.0000 mg | ORAL_TABLET | Freq: Two times a day (BID) | ORAL | 0 refills | Status: DC
Start: 2016-09-13 — End: 2017-02-06

## 2016-09-13 MED ORDER — PREDNISONE 20 MG PO TABS: ORAL_TABLET | ORAL | 0 refills | Status: DC

## 2016-09-13 NOTE — Progress Notes (Signed)
OFFICE VISIT  09/13/2016   CC:  Chief Complaint  Patient presents with  . Cough    wheezing and difficulty sleeping x 3-4 weeks, was seen and treated at an urgent care 2 weeks ago, symtoms improved but returned once he finished Rx (predninsone and pearls) also give nasal spray and albuterol inhaler    HPI:    Patient is a 40 y.o. Caucasian male who presents for respiratory symptoms. Onset of illness about 1 mo ago: see sx's listed below.    Was seen at an UC about 1-2 weeks into the illness and was rx'd prednisone, albut inhaler, steroid nasal spray, and tessalon pearles. The cough and nasal congestion improved for a while, but then all sx's returned when he finished the steroids.  Has wheezing, chest tightness, nasal congestion, coughing.  No face pain, upper teeth pain, or HA.  All sx's worse at night when he lies supine.  Albuterol helpful when his symptoms are severe. Fatigued.  No fevers.  No n/v/d or rash.  Says he had asthma until about 7th grade.  Since then, as an adult he gets a bout of bronchitis annually but no pattern of intermittent SOB or wheezing to suggest persistent asthma.   Past Medical History:  Diagnosis Date  . Acute pancreatitis 05/17/2016   Admitted to hosp while out of town in [place].  Likely secondary to ETOH.  GB/stone w/u NEG.  . Allergy to cats    takes claritin  . Asthma    childhood  . Depression    wellbutrin helped in the past; pt cites failure to respond to prozac, zoloft, and celexa  . Fear of flying    +insomnia; was on xanax regulary for long period and then abruptly stopped it and had w/drawal seizures.  . Hypogonadism male 2012 and 2016  . 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 Era Bumpers)  . Obesity, morbid, BMI 40.0-49.9 ([place])   . Palpitations    24h Holter.: sinus tachycardia  . Syncope due to orthostatic hypotension    d/c'd from [employer] due to this.  Says if Wt 235 lbs or more then  this is not a problem    History reviewed. No pertinent surgical history.  Outpatient Medications Prior to Visit  Medication Sig Dispense Refill  . ALPRAZolam (XANAX) 1 MG tablet 1-2 tabs po q8h prn anxiety 30 tablet 5  . diphenhydrAMINE (BENADRYL) 50 MG capsule Take 50-100 mg by mouth at bedtime as needed.    . DULoxetine (CYMBALTA) 60 MG capsule Take 1 capsule (60 mg total) by mouth daily. 30 capsule 11  . loratadine (CLARITIN) 10 MG tablet Take 10 mg by mouth daily as needed.    . benzonatate (TESSALON) 200 MG capsule Take 1 capsule (200 mg total) by mouth 3 (three) times daily as needed for cough. (Patient not taking: Reported on 09/13/2016) 30 capsule 1  . ondansetron (ZOFRAN-ODT) 8 MG disintegrating tablet Take 1 tablet (8 mg total) by mouth every 8 (eight) hours as needed for nausea or vomiting. (Patient not taking: Reported on 09/13/2016) 30 tablet 0  . Oxycodone HCl 10 MG TABS 1-2 tabs po q6h prn severe pain (Patient not taking: Reported on 09/13/2016) 30 tablet 0  . Syringe/Needle, Disp, (SYRINGE 3CC/22GX1-1/2") 22G X 1-1/2" 3 ML MISC Use as directed for IM injection (Patient not taking: Reported on 09/13/2016) 50 each 1  . testosterone cypionate (DEPOTESTOSTERONE CYPIONATE) 200 MG/ML injection Inject 1 mL (200 mg total)  into the muscle every 14 (fourteen) days. (Patient not taking: Reported on 09/13/2016) 10 mL 0   No facility-administered medications prior to visit.     No Known Allergies  ROS As per HPI  PE: Blood pressure 122/89, pulse 81, temperature 98.1 F (36.7 C), temperature source Oral, resp. rate 16, height 6\' 1"  (1.854 m), weight (!) 319 lb 8 oz (144.9 kg), SpO2 93 %. VS: noted--normal. Gen: alert, NAD, NONTOXIC APPEARING. HEENT: eyes without injection, drainage, or swelling.  Ears: EACs clear, TMs with normal light reflex and landmarks.  Nose: No active rhinorrhea.  Mildly edematous and mildly injected mucosa.  No purulent d/c.  No paranasal sinus TTP.  No facial  swelling.  Throat and mouth without focal lesion.  No pharyngial swelling, erythema, or exudate.   Neck: supple, no LAD.   LUNGS: soft insp and exp wheezing diffusely.  No crackles.  Exp phase was not prolonged but was frequently cut off abruptly by lots of coughing, nonlabored resps.   CV: RRR, no m/r/g. EXT: no c/c/e SKIN: no rash  LABS:  none  IMPRESSION AND PLAN:  Acute asthmatic bronchitis, prolonged/persistent despite systemic steroids within the last month. Prednisone 40mg  qd x 5d, then 20mg  qd x 5d, then 10mg  qd x 6d. Doxycycline 100 mg bid x 10d.  Continue albuterol inhaler 2p qid prn. Hycodan syrup 1-2 tsp qhs prn cough, #120 ml.  Therapeutic expectations and side effect profile of medication discussed today.  Patient's questions answered. Get otc generic robitussin DM OR Mucinex DM and use as directed on the packaging for daytime cough and congestion. Use otc generic saline nasal spray 2-3 times per day to irrigate/moisturize your nasal passages.  An After Visit Summary was printed and given to the patient.  FOLLOW UP: Return if symptoms worsen or fail to improve.  Signed:  Crissie Sickles, MD           09/13/2016

## 2016-09-13 NOTE — Progress Notes (Signed)
Pre visit review using our clinic review tool, if applicable. No additional management support is needed unless otherwise documented below in the visit note. 

## 2016-09-18 ENCOUNTER — Other Ambulatory Visit: Payer: Self-pay | Admitting: *Deleted

## 2016-09-18 MED ORDER — HYDROCODONE-HOMATROPINE 5-1.5 MG/5ML PO SYRP: ORAL_SOLUTION | ORAL | 0 refills | Status: DC

## 2016-09-18 NOTE — Telephone Encounter (Signed)
Pt LMOM on 09/18/16 at 9:28am requesting refill for cough medication.  RF request for hycodan LOV: 09/13/16 Next ov: None Last written: 09/13/16 #1104ml w/ 0RF  Please advise. Thanks.

## 2016-09-18 NOTE — Telephone Encounter (Signed)
Pls notify pt that I printed this rx this time, but after this is finished he needs to switch over to mucinex DM or robitussin DM OTC.  Also, I recommend he take the hycodan syrup ONLY AT BEDTIME for cough.-thx

## 2016-09-19 NOTE — Telephone Encounter (Signed)
Rx printed/signed and put up front for p/u. Pt advised and voiced understanding.

## 2016-11-11 ENCOUNTER — Other Ambulatory Visit: Payer: Self-pay | Admitting: Family Medicine

## 2016-11-13 ENCOUNTER — Telehealth: Payer: Self-pay | Admitting: Family Medicine

## 2016-11-13 MED ORDER — PROAIR HFA 108 (90 BASE) MCG/ACT IN AERS: 1.0000 | INHALATION_SPRAY | RESPIRATORY_TRACT | 0 refills | Status: DC | PRN

## 2016-11-13 NOTE — Telephone Encounter (Signed)
ProAir rx'd.  There is only one dose for this medication.-thx

## 2016-11-13 NOTE — Telephone Encounter (Signed)
Please advise. Thanks.  

## 2016-11-13 NOTE — Telephone Encounter (Signed)
Pt is asking that PROAIR be called into [facility] on [address]. And asking that the dose be increased.

## 2016-11-14 NOTE — Telephone Encounter (Signed)
Left detailed message on pt's cell 

## 2016-11-19 ENCOUNTER — Telehealth: Payer: BLUE CROSS/BLUE SHIELD | Admitting: Family

## 2016-11-19 DIAGNOSIS — B9689 Other specified bacterial agents as the cause of diseases classified elsewhere: Secondary | ICD-10-CM

## 2016-11-19 DIAGNOSIS — J028 Acute pharyngitis due to other specified organisms: Secondary | ICD-10-CM

## 2016-11-19 MED ORDER — PREDNISONE 5 MG PO TABS: 5.0000 mg | ORAL_TABLET | ORAL | 0 refills | Status: DC

## 2016-11-19 MED ORDER — AZITHROMYCIN 250 MG PO TABS: ORAL_TABLET | ORAL | 0 refills | Status: DC

## 2016-11-19 MED ORDER — BENZONATATE 100 MG PO CAPS: 100.0000 mg | ORAL_CAPSULE | Freq: Three times a day (TID) | ORAL | 0 refills | Status: DC | PRN

## 2016-11-19 NOTE — Progress Notes (Signed)
We are sorry that you are not feeling well.  Here is how we plan to help!  Based on what you have shared with me it looks like you have upper respiratory tract inflammation that has resulted in a significant cough.  Inflammation and infection in the upper respiratory tract is commonly called bronchitis and has four common causes:  Allergies, Viral Infections, Acid Reflux and Bacterial Infections.  Allergies, viruses and acid reflux are treated by controlling symptoms or eliminating the cause. An example might be a cough caused by taking certain blood pressure medications. You stop the cough by changing the medication. Another example might be a cough caused by acid reflux. Controlling the reflux helps control the cough.  Based on your presentation I believe you most likely have A cough due to bacteria.  When patients have a fever and a productive cough with a change in color or increased sputum production, we are concerned about bacterial bronchitis.  If left untreated it can progress to pneumonia.  If your symptoms do not improve with your treatment plan it is important that you contact your provider.   I have prescribed Azithromyin 250 mg: two tables now and then one tablet daily for 4 additonal days    In addition you may use A non-prescription cough medication called Mucinex DM: take 2 tablets every 12 hours. and A prescription cough medication called Tessalon Perles 100mg . You may take 1-2 capsules every 8 hours as needed for your cough.  Sterapred 5 mg dosepak   If this reoccurs again soon, you should probably be seen face-to-face. Although, I would say that your asthma is definitely a contributing factor as any type of inflammation makes someone more prone to infection. Asthma, allergies,and smoking are 3 of the most common inflammatory situations that would cause recurrent lung infections.   USE OF BRONCHODILATOR ("RESCUE") INHALERS: There is a risk from using your bronchodilator too frequently.   The risk is that over-reliance on a medication which only relaxes the muscles surrounding the breathing tubes can reduce the effectiveness of medications prescribed to reduce swelling and congestion of the tubes themselves.  Although you feel brief relief from the bronchodilator inhaler, your asthma may actually be worsening with the tubes becoming more swollen and filled with mucus.  This can delay other crucial treatments, such as oral steroid medications. If you need to use a bronchodilator inhaler daily, several times per day, you should discuss this with your provider.  There are probably better treatments that could be used to keep your asthma under control.     HOME CARE . Only take medications as instructed by your medical team. . Complete the entire course of an antibiotic. . Drink plenty of fluids and get plenty of rest. . Avoid close contacts especially the very young and the elderly . Cover your mouth if you cough or cough into your sleeve. . Always remember to wash your hands . A steam or ultrasonic humidifier can help congestion.   GET HELP RIGHT AWAY IF: . You develop worsening fever. . You become short of breath . You cough up blood. . Your symptoms persist after you have completed your treatment plan MAKE SURE YOU   Understand these instructions.  Will watch your condition.  Will get help right away if you are not doing well or get worse.  Your e-visit answers were reviewed by a board certified advanced clinical practitioner to complete your personal care plan.  Depending on the condition, your plan could  have included both over the counter or prescription medications. If there is a problem please reply  once you have received a response from your provider. Your safety is important to [place].  If you have drug allergies check your prescription carefully.    You can use MyChart to ask questions about today's visit, request a non-urgent call back, or ask for a work or school  excuse for 24 hours related to this e-Visit. If it has been greater than 24 hours you will need to follow up with your provider, or enter a new e-Visit to address those concerns. You will get an e-mail in the next two days asking about your experience.  I hope that your e-visit has been valuable and will speed your recovery. Thank you for using e-visits.

## 2017-01-13 DIAGNOSIS — R7303 Prediabetes: Secondary | ICD-10-CM

## 2017-01-13 HISTORY — DX: Prediabetes: R73.03

## 2017-02-06 ENCOUNTER — Ambulatory Visit (INDEPENDENT_AMBULATORY_CARE_PROVIDER_SITE_OTHER): Payer: BLUE CROSS/BLUE SHIELD | Admitting: Family Medicine

## 2017-02-06 ENCOUNTER — Encounter: Payer: Self-pay | Admitting: Family Medicine

## 2017-02-06 VITALS — BP 137/81 | HR 78 | Temp 98.0°F | Resp 16 | Ht 73.0 in | Wt 325.0 lb

## 2017-02-06 DIAGNOSIS — R739 Hyperglycemia, unspecified: Secondary | ICD-10-CM | POA: Diagnosis not present

## 2017-02-06 DIAGNOSIS — R740 Nonspecific elevation of levels of transaminase and lactic acid dehydrogenase [LDH]: Secondary | ICD-10-CM

## 2017-02-06 DIAGNOSIS — R7401 Elevation of levels of liver transaminase levels: Secondary | ICD-10-CM

## 2017-02-06 LAB — HEMOGLOBIN A1C: Hgb A1c MFr Bld: 6.3 % (ref 4.6–6.5)

## 2017-02-06 LAB — COMPREHENSIVE METABOLIC PANEL
ALT: 42 U/L (ref 0–53)
AST: 32 U/L (ref 0–37)
Albumin: 4.6 g/dL (ref 3.5–5.2)
Alkaline Phosphatase: 80 U/L (ref 39–117)
BUN: 11 mg/dL (ref 6–23)
CALCIUM: 9.7 mg/dL (ref 8.4–10.5)
CHLORIDE: 101 meq/L (ref 96–112)
CO2: 29 meq/L (ref 19–32)
Creatinine, Ser: 1 mg/dL (ref 0.40–1.50)
GFR: 87.8 mL/min (ref >60.00)
GLUCOSE: 99 mg/dL (ref 70–99)
POTASSIUM: 4.6 meq/L (ref 3.5–5.1)
Sodium: 137 mEq/L (ref 135–145)
Total Bilirubin: 0.4 mg/dL (ref 0.2–1.2)
Total Protein: 7.6 g/dL (ref 6.0–8.3)

## 2017-02-06 NOTE — Progress Notes (Signed)
OFFICE VISIT  02/06/2017   CC:  Chief Complaint  Patient presents with  . Elevated Blood Sugar   HPI:    Patient is a 41 y.o.  male who presents for recent elevated glucose readings using his father's glucometer at home. Of note, he is getting over a cold currently. His wife noted he has had a couple of episodes of slightly "drunk" acting, with "slurred speech".  Pt did not feel any of these sx's/felt nothing of what she observed.  He then started checking his glucoses at home about 4 weeks ago.  Says he is normally always thirsty, no different than usual lately. No polyuria.   Glucoses: 163, 210--these were after eating but he doesn't recall how long after eating.  Also recalls a 140 reading in morning--he doesn't recall if he was actually fasting. No blurry vision.  FH DM in his dad.  Of note, he did have a 21 pill prednisone taper rx'd 11/19/16--noted in the EMR. In review of EMR glucoses from 2015-2017, there were three readings: 103-108.  No HbA1c has ever been done.   Past Medical History:  Diagnosis Date  . Acute pancreatitis 05/17/2016   Admitted to hosp while out of town in [place].  Likely secondary to ETOH.  GB/stone w/u NEG.  . Allergy to cats    takes claritin  . Asthma    childhood  . Depression    wellbutrin helped in the past; pt cites failure to respond to prozac, zoloft, and celexa  . Fear of flying    +insomnia; was on xanax regulary for long period and then abruptly stopped it and had w/drawal seizures.  . Hypogonadism male 2012 and 2016  . 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 Era Bumpers)  . Obesity, morbid, BMI 40.0-49.9 ([place])   . Palpitations    24h Holter.: sinus tachycardia  . Syncope due to orthostatic hypotension    d/c'd from [employer] due to this.  Says if Wt 235 lbs or more then this is not a problem    History reviewed. No pertinent surgical history.  Outpatient Medications Prior to Visit   Medication Sig Dispense Refill  . ALPRAZolam (XANAX) 1 MG tablet TAKE 1 TO 2 TABLETS BY MOUTH EVERY 8 HOURS AS NEEDED FOR ANXIETY. 30 tablet 5  . DULoxetine (CYMBALTA) 60 MG capsule Take 1 capsule (60 mg total) by mouth daily. 30 capsule 11  . ipratropium (ATROVENT) 0.06 % nasal spray     . PROAIR HFA 108 (90 Base) MCG/ACT inhaler Inhale 1-2 puffs into the lungs every 4 (four) hours as needed for wheezing or shortness of breath. 1 Inhaler 0  . azithromycin (ZITHROMAX) 250 MG tablet Take 2 tabs now then 1 daily times 4 days (Patient not taking: Reported on 02/06/2017) 6 tablet 0  . benzonatate (TESSALON PERLES) 100 MG capsule Take 1-2 capsules (100-200 mg total) by mouth every 8 (eight) hours as needed for cough. (Patient not taking: Reported on 02/06/2017) 30 capsule 0  . diphenhydrAMINE (BENADRYL) 50 MG capsule Take 50-100 mg by mouth at bedtime as needed.    . doxycycline (VIBRA-TABS) 100 MG tablet Take 1 tablet (100 mg total) by mouth 2 (two) times daily. (Patient not taking: Reported on 02/06/2017) 20 tablet 0  . HYDROcodone-homatropine (HYCODAN) 5-1.5 MG/5ML syrup 1-2 tsp po qhs prn cough (Patient not taking: Reported on 02/06/2017) 120 mL 0  . loratadine (CLARITIN) 10 MG tablet Take 10 mg by mouth daily  as needed.    . predniSONE (DELTASONE) 20 MG tablet 2 tabs po qd x 5d, then 1 tab po qd x 5d, then 1/2 tab po qd x 6d (Patient not taking: Reported on 02/06/2017) 18 tablet 0  . predniSONE (DELTASONE) 5 MG tablet Take 1 tablet (5 mg total) by mouth as directed. 21 dose pack taper per pharmacy (Patient not taking: Reported on 02/06/2017) 21 tablet 0   No facility-administered medications prior to visit.     No Known Allergies  ROS As per HPI  PE: Blood pressure 137/81, pulse 78, temperature 98 F (36.7 C), temperature source Oral, resp. rate 16, height 6\' 1"  (1.854 m), weight (!) 325 lb (147.4 kg), SpO2 96 %. Gen: Alert, well appearing.  Patient is oriented to person, place, time, and  situation. YBO:FBPZ: no injection, icteris, swelling, or exudate.  EOMI, PERRLA..  + Nasal congestion, no purulent rhinorrhea. Mouth: lips without lesion/swelling.  Oral mucosa pink and moist. Oropharynx without erythema, exudate, or swelling.  AFFECT: pleasant, lucid thought and speech. CV: RRR, no m/r/g.   LUNGS: CTA bilat, nonlabored resps, good aeration in all lung fields. EXT: no clubbing, cyanosis, or edema.   LABS:    Chemistry      Component Value Date/Time   NA 136 05/23/2016 0933   K 4.6 05/23/2016 0933   CL 103 05/23/2016 0933   CO2 24 05/23/2016 0933   BUN 14 05/23/2016 0933   CREATININE 0.95 05/23/2016 0933      Component Value Date/Time   CALCIUM 9.5 05/23/2016 0933   ALKPHOS 80 05/23/2016 0933   AST 48 (H) 05/23/2016 0933   ALT 57 (H) 05/23/2016 0933   BILITOT 0.3 05/23/2016 0933     Lab Results  Component Value Date   CHOL 166 05/23/2016   HDL 29.60 (L) 05/23/2016   LDLCALC 107 (H) 05/23/2016   TRIG 149.0 05/23/2016   CHOLHDL 6 05/23/2016   No results found for: HGBA1C  IMPRESSION AND PLAN:  1) Hyperglycemia: unclear how long after eating these sugars were checked. He is fasting today so we'll check fasting serum glucose as well as HbA1c. Will check electrolytes.  2) History of transaminasema: mild.  Suspect fatty liver. If elevated again today, will add on Hep C antibody and Hep Bs Ag.  An After Visit Summary was printed and given to the patient.  FOLLOW UP: Return for to be determined based on results of w/u.  Signed:  Crissie Sickles, MD           02/06/2017

## 2017-02-06 NOTE — Progress Notes (Signed)
Pre visit review using our clinic review tool, if applicable. No additional management support is needed unless otherwise documented below in the visit note. 

## 2017-02-07 ENCOUNTER — Encounter: Payer: Self-pay | Admitting: *Deleted

## 2017-02-15 DIAGNOSIS — Z713 Dietary counseling and surveillance: Secondary | ICD-10-CM | POA: Diagnosis not present

## 2017-02-26 ENCOUNTER — Telehealth: Payer: Self-pay | Admitting: Family Medicine

## 2017-02-26 DIAGNOSIS — E291 Testicular hypofunction: Secondary | ICD-10-CM

## 2017-02-26 NOTE — Telephone Encounter (Signed)
Voice mail retrieved on phone at front desk.  Message left on 02/26/17 at 9:57am, message retrieved at 2:56pm.  Patient requesting lab results related to his low testosterone be sent to Dr. Gerda Diss Endo).  He would like a call back when this has been done.

## 2017-02-28 ENCOUNTER — Ambulatory Visit: Payer: Self-pay | Admitting: Family Medicine

## 2017-03-01 NOTE — Telephone Encounter (Signed)
SW pt, he stated that he would like to see Dr. Loanne Drilling for his low testosterone. He is requesting a referral. Please advise. Thanks.

## 2017-03-01 NOTE — Telephone Encounter (Signed)
Referral ordered as per pt request. 

## 2017-03-04 ENCOUNTER — Ambulatory Visit (INDEPENDENT_AMBULATORY_CARE_PROVIDER_SITE_OTHER): Payer: BLUE CROSS/BLUE SHIELD | Admitting: Family Medicine

## 2017-03-04 ENCOUNTER — Encounter: Payer: Self-pay | Admitting: Family Medicine

## 2017-03-04 VITALS — BP 123/82 | HR 75 | Temp 97.8°F | Resp 16 | Ht 73.0 in | Wt 321.8 lb

## 2017-03-04 DIAGNOSIS — F988 Other specified behavioral and emotional disorders with onset usually occurring in childhood and adolescence: Secondary | ICD-10-CM

## 2017-03-04 MED ORDER — AMPHETAMINE-DEXTROAMPHET ER 20 MG PO CP24: 20.0000 mg | ORAL_CAPSULE | ORAL | 0 refills | Status: DC

## 2017-03-04 NOTE — Progress Notes (Signed)
OFFICE VISIT  03/04/2017   CC:  Chief Complaint  Patient presents with  . Trouble Focusing   HPI:    Patient is a 41 y.o. Caucasian male who presents for "trouble concentrating". Says he has had these symptoms x many years.  Poor concentration, poor focus, lack of motivation to finish things--esp ones that are multi-step or complicated, easily distracted, feels an internal tension to be doing something all the time, fidgets a lot, can't sit still. Sx's affect his life in moderate to severe intensity. No racing thoughts.   Denies depressed mood.  These symptoms manifest in personal, professional, and social life. Energy low chronically.  Has hx of GAD/depression but cymbalta significantly helps this.  Never been dx'd or treated with ADD in the past.  Has family members who have dx of ADD. No onset of the sx's after starting any new med.     Past Medical History:  Diagnosis Date  . Acute pancreatitis 05/17/2016   Admitted to hosp while out of town in [place].  Likely secondary to ETOH.  GB/stone w/u NEG.  . Allergy to cats    takes claritin  . Asthma    childhood  . Depression    wellbutrin helped in the past; pt cites failure to respond to prozac, zoloft, and celexa  . Fear of flying    +insomnia; was on xanax regulary for long period and then abruptly stopped it and had w/drawal seizures.  . Hypogonadism male 2012 and 2016  . 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 Era Bumpers)  . Obesity, morbid, BMI 40.0-49.9 ([place])   . Palpitations    24h Holter.: sinus tachycardia  . Prediabetes 01/2017   A1c 6.3%   . Syncope due to orthostatic hypotension    d/c'd from [employer] due to this.  Says if Wt 235 lbs or more then this is not a problem    History reviewed. No pertinent surgical history.  Outpatient Medications Prior to Visit  Medication Sig Dispense Refill  . ALPRAZolam (XANAX) 1 MG tablet TAKE 1 TO 2 TABLETS BY MOUTH EVERY 8  HOURS AS NEEDED FOR ANXIETY. 30 tablet 5  . DULoxetine (CYMBALTA) 60 MG capsule Take 1 capsule (60 mg total) by mouth daily. 30 capsule 11  . ipratropium (ATROVENT) 0.06 % nasal spray     . levocetirizine (XYZAL) 5 MG tablet Take 5 mg by mouth 2 (two) times daily.    Marland Kitchen PROAIR HFA 108 (90 Base) MCG/ACT inhaler Inhale 1-2 puffs into the lungs every 4 (four) hours as needed for wheezing or shortness of breath. 1 Inhaler 0   No facility-administered medications prior to visit.     No Known Allergies  ROS No HAs, no palpitations, no CP, no SOB, no vision or Hearing c/o's.  PE: Blood pressure 123/82, pulse 75, temperature 97.8 F (36.6 C), temperature source Oral, resp. rate 16, height 6\' 1"  (1.854 m), weight (!) 321 lb 12 oz (145.9 kg), SpO2 97 %.. Wt Readings from Last 2 Encounters:  03/04/17 (!) 321 lb 12 oz (145.9 kg)  02/06/17 (!) 325 lb (147.4 kg)    Gen: alert, oriented x 4, affect pleasant.  Lucid thinking and conversation noted. HEENT: PERRLA, EOMI.   Neck: no LAD, mass, or thyromegaly. CV: RRR, no m/r/g LUNGS: CTA bilat, nonlabored. NEURO: no tremor or tics noted on observation.  Coordination intact. CN 2-12 grossly intact bilaterally, strength 5/5 in all extremeties.  No ataxia.  LABS:  none  IMPRESSION AND PLAN:  Adult ADD, new dx: discussed dx with pt. Discussed med options. He came in already stating a preference for starting adderall since this is what his brother takes and is doing well. Started adderall xr generic 20mg , 1 q AM.  Therapeutic expectations and side effect profile of medication discussed today.  Patient's questions answered.  An After Visit Summary was printed and given to the patient.  FOLLOW UP: Return in about 4 weeks (around 04/01/2017) for f/u adult ADD.  Signed:  Crissie Sickles, MD           03/04/2017

## 2017-03-13 DIAGNOSIS — Z713 Dietary counseling and surveillance: Secondary | ICD-10-CM | POA: Diagnosis not present

## 2017-04-04 ENCOUNTER — Ambulatory Visit (INDEPENDENT_AMBULATORY_CARE_PROVIDER_SITE_OTHER): Payer: BLUE CROSS/BLUE SHIELD | Admitting: Family Medicine

## 2017-04-04 ENCOUNTER — Encounter: Payer: Self-pay | Admitting: Family Medicine

## 2017-04-04 VITALS — BP 118/78 | HR 82 | Temp 97.9°F | Resp 16 | Ht 73.0 in | Wt 316.8 lb

## 2017-04-04 DIAGNOSIS — F909 Attention-deficit hyperactivity disorder, unspecified type: Secondary | ICD-10-CM | POA: Diagnosis not present

## 2017-04-04 MED ORDER — AMPHETAMINE-DEXTROAMPHET ER 20 MG PO CP24: ORAL_CAPSULE | ORAL | 0 refills | Status: DC

## 2017-04-04 MED ORDER — AMPHETAMINE-DEXTROAMPHETAMINE 10 MG PO TABS: ORAL_TABLET | ORAL | 0 refills | Status: DC

## 2017-04-04 MED ORDER — AMPHETAMINE-DEXTROAMPHETAMINE 10 MG PO TABS
ORAL_TABLET | ORAL | 0 refills | Status: DC
Start: 2017-04-04 — End: 2017-04-04

## 2017-04-04 NOTE — Progress Notes (Signed)
OFFICE VISIT  04/04/2017   CC:  Chief Complaint  Patient presents with  . Follow-up    ADD   HPI:    Patient is a 41 y.o. Caucasian male who presents for 1 mo f/u adult ADD. Last visit we started him on adderall XR 20mg  qAM. Feels like it helps moderately well but he is in favor of going up on dosing. Lasts approx 8 hours.  He has a good number of days in which he feels like a longer duration of effect would be very helpful. No adverse side effects except decreased appetite, which he doesn't mind.  Past Medical History:  Diagnosis Date  . Acute pancreatitis 05/17/2016   Admitted to hosp while out of town in [place].  Likely secondary to ETOH.  GB/stone w/u NEG.  . Allergy to cats    takes claritin  . Asthma    childhood  . Depression    wellbutrin helped in the past; pt cites failure to respond to prozac, zoloft, and celexa  . Fear of flying    +insomnia; was on xanax regulary for long period and then abruptly stopped it and had w/drawal seizures.  . Hypogonadism male 2012 and 2016  . 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 Era Bumpers)  . Obesity, morbid, BMI 40.0-49.9 ([place])   . Palpitations    24h Holter.: sinus tachycardia  . Prediabetes 01/2017   A1c 6.3%   . Syncope due to orthostatic hypotension    d/c'd from [employer] due to this.  Says if Wt 235 lbs or more then this is not a problem    History reviewed. No pertinent surgical history.  Outpatient Medications Prior to Visit  Medication Sig Dispense Refill  . ALPRAZolam (XANAX) 1 MG tablet TAKE 1 TO 2 TABLETS BY MOUTH EVERY 8 HOURS AS NEEDED FOR ANXIETY. 30 tablet 5  . DULoxetine (CYMBALTA) 60 MG capsule Take 1 capsule (60 mg total) by mouth daily. 30 capsule 11  . ipratropium (ATROVENT) 0.06 % nasal spray     . levocetirizine (XYZAL) 5 MG tablet Take 5 mg by mouth 2 (two) times daily.    Marland Kitchen PROAIR HFA 108 (90 Base) MCG/ACT inhaler Inhale 1-2 puffs into the lungs every 4  (four) hours as needed for wheezing or shortness of breath. 1 Inhaler 0  . amphetamine-dextroamphetamine (ADDERALL XR) 20 MG 24 hr capsule Take 1 capsule (20 mg total) by mouth every morning. 30 capsule 0   No facility-administered medications prior to visit.     No Known Allergies  ROS As per HPI  PE: Blood pressure 118/78, pulse 82, temperature 97.9 F (36.6 C), temperature source Oral, resp. rate 16, height 6\' 1"  (1.854 m), weight (!) 316 lb 12 oz (143.7 kg), SpO2 94 %. Wt Readings from Last 2 Encounters:  04/04/17 (!) 316 lb 12 oz (143.7 kg)  03/04/17 (!) 321 lb 12 oz (145.9 kg)    Gen: alert, oriented x 4, affect pleasant.  Lucid thinking and conversation noted. HEENT: PERRLA, EOMI.   Neck: no LAD, mass, or thyromegaly. CV: RRR, no m/r/g LUNGS: CTA bilat, nonlabored. NEURO: no tremor or tics noted on observation.  Coordination intact. CN 2-12 grossly intact bilaterally, strength 5/5 in all extremeties.  No ataxia.   LABS:    Chemistry      Component Value Date/Time   NA 137 02/06/2017 1139   K 4.6 02/06/2017 1139   CL 101 02/06/2017 1139   CO2  29 02/06/2017 1139   BUN 11 02/06/2017 1139   CREATININE 1.00 02/06/2017 1139      Component Value Date/Time   CALCIUM 9.7 02/06/2017 1139   ALKPHOS 80 02/06/2017 1139   AST 32 02/06/2017 1139   ALT 42 02/06/2017 1139   BILITOT 0.4 02/06/2017 1139       IMPRESSION AND PLAN:  Adult ADD: plan is to increase to TWO of the 20mg  adderall XR qAM, and add a dose of adderall IR/short acting in the mid afternoon--10mg , 1-2 q afternoon.  I printed rx's for adderall XR 20mg , 2 caps po qAM, #60 and adderall 10mg , 1-2 tabs po q afternoon, #60 today for this month, July 2018, and August 2018.  Appropriate fill on/after date was noted on each rx.  An After Visit Summary was printed and given to the patient.  FOLLOW UP: Return in about 3 months (around 07/05/2017) for routine chronic illness f/u.  Signed:  Crissie Sickles, MD            04/04/2017

## 2017-04-10 ENCOUNTER — Encounter: Payer: Self-pay | Admitting: Endocrinology

## 2017-04-10 ENCOUNTER — Ambulatory Visit (INDEPENDENT_AMBULATORY_CARE_PROVIDER_SITE_OTHER): Payer: BLUE CROSS/BLUE SHIELD | Admitting: Endocrinology

## 2017-04-10 VITALS — BP 134/88 | HR 74 | Wt 320.0 lb

## 2017-04-10 DIAGNOSIS — E291 Testicular hypofunction: Secondary | ICD-10-CM

## 2017-04-10 LAB — IBC PANEL
Iron: 72 ug/dL (ref 42–165)
Saturation Ratios: 22.6 % (ref 20.0–50.0)
Transferrin: 228 mg/dL (ref 212.0–360.0)

## 2017-04-10 LAB — LUTEINIZING HORMONE: LH: 5.61 m[IU]/mL (ref 1.50–9.30)

## 2017-04-10 LAB — FOLLICLE STIMULATING HORMONE: FSH: 6.8 m[IU]/mL (ref 1.4–18.1)

## 2017-04-10 LAB — TSH: TSH: 2.48 u[IU]/mL (ref 0.35–4.50)

## 2017-04-10 LAB — T4, FREE: FREE T4: 0.71 ng/dL (ref 0.60–1.60)

## 2017-04-10 NOTE — Patient Instructions (Addendum)
blood tests are requested for you today.  We'll let you know about the results. Testosterone treatment has risks, including increased or decreased fertility (depending on the type of treatment), hair loss, prostate cancer, benign prostate enlargement, blood clots, liver problems, lower hdl ("good cholesterol"), polycythemia (opposite of anemia), sleep apnea, and behavior changes.  Weight loss helps the testosterone, also.

## 2017-04-10 NOTE — Progress Notes (Signed)
Subjective:    Patient ID: Philip Daugherty, male    DOB: 1975-11-27, 41 y.o.   MRN: [id number]  HPI Pt is referred by for hypogonadism.  Pt reports he had puberty at the normal age.  He has 1 biological child 2016--required IVF).  He says he has never taken illicit androgens.  He took clomid x 6-12 months, in 2014 (this was rx'ed by fertility dr in [place], due to low sperm count).  He took androgel from 2016-2017.  He also took depo-testosterone for a few months, also in 2017.  He does not take antiandrogens or opioids.  He denies any h/o XRT, or genital infection.  He has never had surgery, or a serious injury to the head or genital area. He has no h/o sleep apnea or DVT.   He no longer consumes alcohol excessively.  He has severe weight gain, and assoc fatigue.  He does not want to re-try clomiphene.   Past Medical History:  Diagnosis Date  . Acute pancreatitis 05/17/2016   Admitted to hosp while out of town in [place].  Likely secondary to ETOH.  GB/stone w/u NEG.  . Allergy to cats    takes claritin  . Asthma    childhood  . Depression    wellbutrin helped in the past; pt cites failure to respond to prozac, zoloft, and celexa  . Fear of flying    +insomnia; was on xanax regulary for long period and then abruptly stopped it and had w/drawal seizures.  . Hypogonadism male 2012 and 2016  . 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 Era Bumpers)  . Obesity, morbid, BMI 40.0-49.9 ([place])   . Palpitations    24h Holter.: sinus tachycardia  . Prediabetes 01/2017   A1c 6.3%   . Syncope due to orthostatic hypotension    d/c'd from [employer] due to this.  Says if Wt 235 lbs or more then this is not a problem    No past surgical history on file.  Social History   Social History  . Marital status: Married    Spouse name: N/A  . Number of children: N/A  . Years of education: N/A   Occupational History  . Not on file.   Social History Main Topics    . Smoking status: Never Smoker  . Smokeless tobacco: Never Used  . Alcohol use Yes     Comment: has decreased since 01/04/12  . Drug use: No  . Sexual activity: Not on file   Other Topics Concern  . Not on file   Social History Narrative   Married, second marriage.  No children.   Orig from [place], spent time in [place], college in [place].   Relocated from [place] to [place] again summer 2012, works as Occupational psychologist for [employer].   No tobacco.  Alcohol: 3-4 bourbon drinks most days, cut back as of 12/2011.   Exercise: sporadic (elliptical, walking).  No hx of drug use/abuse.          Current Outpatient Prescriptions on File Prior to Visit  Medication Sig Dispense Refill  . ALPRAZolam (XANAX) 1 MG tablet TAKE 1 TO 2 TABLETS BY MOUTH EVERY 8 HOURS AS NEEDED FOR ANXIETY. 30 tablet 5  . amphetamine-dextroamphetamine (ADDERALL XR) 20 MG 24 hr capsule 2 caps po qAM 60 capsule 0  . amphetamine-dextroamphetamine (ADDERALL) 10 MG tablet 1-2 tabs po q afternoon 60 tablet 0  . DULoxetine (CYMBALTA) 60 MG capsule Take  1 capsule (60 mg total) by mouth daily. 30 capsule 11  . ipratropium (ATROVENT) 0.06 % nasal spray     . levocetirizine (XYZAL) 5 MG tablet Take 5 mg by mouth 2 (two) times daily.    Marland Kitchen PROAIR HFA 108 (90 Base) MCG/ACT inhaler Inhale 1-2 puffs into the lungs every 4 (four) hours as needed for wheezing or shortness of breath. 1 Inhaler 0   No current facility-administered medications on file prior to visit.     No Known Allergies  Family History  Problem Relation Age of Onset  . Depression Mother        bipolar disorder  . Parkinsonism Mother   . Diabetes Father   . Other Father        low testosterone    BP 134/88 (BP Location: Left Arm, Patient Position: Sitting)   Pulse 74   Wt (!) 320 lb (145.2 kg)   SpO2 98%   BMI 42.22 kg/m    Review of Systems denies numbness, erectile dysfunction, decreased urinary stream, gynecomastia, muscle weakness, fever, headache,  easy bruising, sob, rash, blurry vision, rhinorrhea, chest pain.  Anxiety and depression are well-controlled.     Objective:   Physical Exam VS: see vs page GEN: no distress HEAD: head: no deformity eyes: no periorbital swelling, no proptosis external nose and ears are normal mouth: no lesion seen NECK: supple, thyroid is not enlarged CHEST WALL: no deformity LUNGS: clear to auscultation BREASTS:  No gynecomastia CV: reg rate and rhythm, no murmur ABD: abdomen is soft, nontender.  no hepatosplenomegaly.  not distended.  no hernia GENITALIA:  Normal male.   MUSCULOSKELETAL: muscle bulk and strength are grossly normal.  no obvious joint swelling.  gait is normal and steady EXTEMITIES: no deformity.  no ulcer on the feet.  feet are of normal color and temp.  no edema PULSES: dorsalis pedis intact bilat.  no carotid bruit NEURO:  cn 2-12 grossly intact.   readily moves all 4's.  sensation is intact to touch on the feet SKIN:  Normal texture and temperature.  No rash or suspicious lesion is visible.  Normal hair distribution.   NODES:  None palpable at the neck.   PSYCH: alert, well-oriented.  Does not appear anxious nor depressed.    Lab Results  Component Value Date   TESTOSTERONE 86 (L) 04/10/2017   I have reviewed outside records, and summarized: Pt was noted to have hypogonadism, and referred here.  He is noted to be on both a stimulant and sedative.       Assessment & Plan:  Hypogonadism, new to me, uncertain etiology. Anxiety/depression: in this setting, testosterone replacement would be at a low dosage at first.   Patient Instructions  blood tests are requested for you today.  We'll let you know about the results. Testosterone treatment has risks, including increased or decreased fertility (depending on the type of treatment), hair loss, prostate cancer, benign prostate enlargement, blood clots, liver problems, lower hdl ("good cholesterol"), polycythemia (opposite of  anemia), sleep apnea, and behavior changes.  Weight loss helps the testosterone, also.

## 2017-04-11 LAB — TESTOSTERONE,FREE AND TOTAL
TESTOSTERONE FREE: 4.6 pg/mL — AB (ref 6.8–21.5)
TESTOSTERONE: 86 ng/dL — AB (ref 264–916)

## 2017-04-11 LAB — PROLACTIN: PROLACTIN: 5.4 ng/mL (ref 2.0–18.0)

## 2017-04-19 ENCOUNTER — Encounter: Payer: Self-pay | Admitting: Endocrinology

## 2017-04-28 ENCOUNTER — Other Ambulatory Visit: Payer: Self-pay | Admitting: Family Medicine

## 2017-04-29 NOTE — Telephone Encounter (Signed)
[place]  RF request for duloxetine LOV: 04/04/17 Next ov: None Last written: 04/12/16 #30 w/ 11RF

## 2017-05-08 ENCOUNTER — Encounter: Payer: Self-pay | Admitting: Family Medicine

## 2017-05-19 DIAGNOSIS — J020 Streptococcal pharyngitis: Secondary | ICD-10-CM | POA: Diagnosis not present

## 2017-05-23 ENCOUNTER — Ambulatory Visit (INDEPENDENT_AMBULATORY_CARE_PROVIDER_SITE_OTHER): Payer: BLUE CROSS/BLUE SHIELD | Admitting: Family Medicine

## 2017-05-23 ENCOUNTER — Encounter: Payer: Self-pay | Admitting: Family Medicine

## 2017-05-23 VITALS — BP 120/87 | HR 86 | Temp 98.0°F | Resp 16 | Ht 73.0 in | Wt 301.2 lb

## 2017-05-23 DIAGNOSIS — J020 Streptococcal pharyngitis: Secondary | ICD-10-CM

## 2017-05-23 DIAGNOSIS — R7303 Prediabetes: Secondary | ICD-10-CM | POA: Diagnosis not present

## 2017-05-23 DIAGNOSIS — F988 Other specified behavioral and emotional disorders with onset usually occurring in childhood and adolescence: Secondary | ICD-10-CM

## 2017-05-23 LAB — POCT RAPID STREP A (OFFICE): Rapid Strep A Screen: NEGATIVE

## 2017-05-23 MED ORDER — PREDNISONE 20 MG PO TABS: ORAL_TABLET | ORAL | 0 refills | Status: DC

## 2017-05-23 MED ORDER — AMPHETAMINE-DEXTROAMPHETAMINE 10 MG PO TABS: ORAL_TABLET | ORAL | 0 refills | Status: DC

## 2017-05-23 MED ORDER — AMPHETAMINE-DEXTROAMPHET ER 20 MG PO CP24: ORAL_CAPSULE | ORAL | 0 refills | Status: DC

## 2017-05-23 MED ORDER — AMOXICILLIN 875 MG PO TABS: 875.0000 mg | ORAL_TABLET | Freq: Two times a day (BID) | ORAL | 0 refills | Status: AC

## 2017-05-23 MED ORDER — HYDROCODONE-ACETAMINOPHEN 5-325 MG PO TABS: 1.0000 | ORAL_TABLET | Freq: Four times a day (QID) | ORAL | 0 refills | Status: DC | PRN

## 2017-05-23 NOTE — Progress Notes (Signed)
OFFICE VISIT  05/23/2017   CC:  Chief Complaint  Patient presents with  . Follow-up    ADD  . Sore Throat    was seen and dx at a urgent care   HPI:    Patient is a 41 y.o. Caucasian male who presents for 3 mo f/u adult ADD.   Last f/u for this, we increased his adderall XR to TWO of the 20mg  tabs qAM and also added on adderall short-acting 10mg , 1-2 q afternoon prn.    He is happy with the results/dosages at this time.  Focus, concentration, motivation, and organization is responding well.  Duration of action is good.  NO adverse side effects.    Also has sore throat.  Went to UC for this 5 d/a, strep testing was positive.  He had been ill for 1 day at that time--nasal congestion, diffuse achy joints, temp 102, HA.  No coughing.  No rash. He got a steroid injection and amoxil bid rx.    Felt improved x 1d. Then sx's returned--Still with lots of pain in throat. The body aches are gone.   Still feels intermittent subjective fevers.  No pain in sinuses or upper teeth.  No cough. No asymmetry to his throat pain.  +fatigue.   Taking tylenol round the clock.  Past Medical History:  Diagnosis Date  . Acute pancreatitis 05/17/2016   Admitted to hosp while out of town in [place].  Likely secondary to ETOH.  GB/stone w/u NEG.  . Allergy to cats    takes claritin  . Asthma    childhood  . Depression    wellbutrin helped in the past; pt cites failure to respond to prozac, zoloft, and celexa  . Fear of flying    +insomnia; was on xanax regulary for long period and then abruptly stopped it and had w/drawal seizures.  . Hypogonadism male 2012 and 2016   Managed by Dr. Loanne Drilling ([facility]) as of 04/2017  . 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 Era Bumpers)  . Obesity, morbid, BMI 40.0-49.9 ([place])   . Palpitations    24h Holter.: sinus tachycardia  . Prediabetes 01/2017   A1c 6.3%   . Syncope due to orthostatic hypotension    d/c'd from [employer]  due to this.  Says if Wt 235 lbs or more then this is not a problem    History reviewed. No pertinent surgical history.  Outpatient Medications Prior to Visit  Medication Sig Dispense Refill  . ALPRAZolam (XANAX) 1 MG tablet TAKE 1 TO 2 TABLETS BY MOUTH EVERY 8 HOURS AS NEEDED FOR ANXIETY. 30 tablet 5  . DULoxetine (CYMBALTA) 60 MG capsule TAKE ONE CAPSULE BY MOUTH DAILY 30 capsule 6  . ipratropium (ATROVENT) 0.06 % nasal spray     . levocetirizine (XYZAL) 5 MG tablet Take 5 mg by mouth 2 (two) times daily.    Marland Kitchen PROAIR HFA 108 (90 Base) MCG/ACT inhaler Inhale 1-2 puffs into the lungs every 4 (four) hours as needed for wheezing or shortness of breath. 1 Inhaler 0  . amphetamine-dextroamphetamine (ADDERALL XR) 20 MG 24 hr capsule 2 caps po qAM 60 capsule 0  . amphetamine-dextroamphetamine (ADDERALL) 10 MG tablet 1-2 tabs po q afternoon 60 tablet 0   No facility-administered medications prior to visit.     No Known Allergies  ROS As per HPI  PE: Blood pressure 120/87, pulse 86, temperature 98 F (36.7 C), temperature source Oral, resp. rate 16,  height 6\' 1"  (1.854 m), weight (!) 301 lb 4 oz (136.6 kg), SpO2 99 %. VS: noted--normal. Gen: alert, NAD, NONTOXIC APPEARING. HEENT: eyes without injection, drainage, or swelling.  Ears: EACs clear, TMs with normal light reflex and landmarks.  Nose: Scant clear rhinorrhea, with some dried, crusty exudate adherent to mildly injected mucosa.  No purulent d/c.  No paranasal sinus TTP.  No facial swelling.  Throat and mouth without focal lesion. Mild soft palate and pharyngeal swelling, erythema.  NO exudate.  No asymmetry.  Tonsils are avg size..   Neck: supple, no LAD.   LUNGS: CTA bilat, nonlabored resps.   CV: RRR, no m/r/g. EXT: no c/c/e SKIN: no rash  LABS:   Rapid strep today: NEG  Lab Results  Component Value Date   CHOL 166 05/23/2016   HDL 29.60 (L) 05/23/2016   LDLCALC 107 (H) 05/23/2016   TRIG 149.0 05/23/2016   CHOLHDL 6  05/23/2016      Chemistry      Component Value Date/Time   NA 137 02/06/2017 1139   K 4.6 02/06/2017 1139   CL 101 02/06/2017 1139   CO2 29 02/06/2017 1139   BUN 11 02/06/2017 1139   CREATININE 1.00 02/06/2017 1139      Component Value Date/Time   CALCIUM 9.7 02/06/2017 1139   ALKPHOS 80 02/06/2017 1139   AST 32 02/06/2017 1139   ALT 42 02/06/2017 1139   BILITOT 0.4 02/06/2017 1139     Lab Results  Component Value Date   HGBA1C 6.3 02/06/2017    IMPRESSION AND PLAN:  1) Adult ADD: The current medical regimen is effective;  continue present plan and medications. I printed rx's for adderall xr 20mg , 2 tabs qAM, #60 and adderall short acting 10mg , 1-2 q afternoon, #60 today for Sept, Oct, and Nov 2018 (he has rx at this time for this month).  Appropriate fill on/after date was noted on each rx.  2) Streptococcal pharyngitis: only partially treated, likely due to low amoxil dosing. Rapid strep today NEG.  Will send group A throat culture. Plan is to stop the 500 mg amoxil. Start amoxil 875mg , 1 tab bid x 10d. Prednisone 40mg  qd x 3d, then 20mg  qd x 3d. Vicodin 5/325, 1-2 q6h prn, #20---for severe throat pain. Use ibuprofen 600 mg tid prn (with food) for fevers.  3) Prediabetes: not discussed today, except for plan to recheck fasting BMET and Hba1c at next f/u visit in 4 mo. Also, FLP at that time since it has been 1 yr since this was last checked.  An After Visit Summary was printed and given to the patient.  FOLLOW UP: Return in about 4 months (around 09/22/2017) for f/u ADD/glucose issues (30 min).  Signed:  Crissie Sickles, MD           05/23/2017

## 2017-05-25 LAB — CULTURE, GROUP A STREP

## 2017-09-03 ENCOUNTER — Other Ambulatory Visit: Payer: Self-pay | Admitting: *Deleted

## 2017-09-03 NOTE — Telephone Encounter (Signed)
Pt LMOM on 09/03/17 at 12:03pm requesting Rx for his Adderall.   RF request for adderall LOV: 05/23/17 Next ov: 09/24/17 Last written: 05/23/17 #60 w/ 0RF  Please advise. Thanks.

## 2017-09-03 NOTE — Telephone Encounter (Signed)
Pt advised, he stated that he thought that he just filled his last Rx but he will recheck and see if he has it. He asked if he could go ahead and pick up Rx's for the next 3 months. I advised pt that we will give him his next 3Rx's at his office visit on 09/24/17. Pt stated that he didn't know he had an apt next month but he will come on the 11th.

## 2017-09-03 NOTE — Telephone Encounter (Signed)
Last time I saw him I gave him several prescriptions for his adderall.  The 3rd rx I gave him had the instructions to "fill on or after 08/30/17" so he should already have a rx to fill and it will last him until next o/v with me next month.

## 2017-09-24 ENCOUNTER — Ambulatory Visit: Payer: BLUE CROSS/BLUE SHIELD | Admitting: Family Medicine

## 2017-10-01 ENCOUNTER — Encounter: Payer: Self-pay | Admitting: *Deleted

## 2017-10-01 ENCOUNTER — Ambulatory Visit: Payer: BLUE CROSS/BLUE SHIELD | Admitting: Family Medicine

## 2017-10-01 ENCOUNTER — Encounter: Payer: Self-pay | Admitting: Family Medicine

## 2017-10-01 VITALS — BP 114/78 | HR 82 | Temp 98.0°F | Resp 16 | Ht 73.0 in | Wt 303.8 lb

## 2017-10-01 DIAGNOSIS — F909 Attention-deficit hyperactivity disorder, unspecified type: Secondary | ICD-10-CM

## 2017-10-01 DIAGNOSIS — F419 Anxiety disorder, unspecified: Secondary | ICD-10-CM | POA: Diagnosis not present

## 2017-10-01 DIAGNOSIS — F32A Depression, unspecified: Secondary | ICD-10-CM

## 2017-10-01 DIAGNOSIS — G5603 Carpal tunnel syndrome, bilateral upper limbs: Secondary | ICD-10-CM

## 2017-10-01 DIAGNOSIS — Z23 Encounter for immunization: Secondary | ICD-10-CM

## 2017-10-01 DIAGNOSIS — R7303 Prediabetes: Secondary | ICD-10-CM

## 2017-10-01 DIAGNOSIS — F329 Major depressive disorder, single episode, unspecified: Secondary | ICD-10-CM

## 2017-10-01 LAB — BASIC METABOLIC PANEL
BUN: 11 mg/dL (ref 6–23)
CALCIUM: 9.4 mg/dL (ref 8.4–10.5)
CO2: 32 meq/L (ref 19–32)
Chloride: 99 mEq/L (ref 96–112)
Creatinine, Ser: 0.88 mg/dL (ref 0.40–1.50)
GFR: 101.42 mL/min (ref >60.00)
GLUCOSE: 88 mg/dL (ref 70–99)
Potassium: 4.6 mEq/L (ref 3.5–5.1)
SODIUM: 138 meq/L (ref 135–145)

## 2017-10-01 LAB — HEMOGLOBIN A1C: Hgb A1c MFr Bld: 6.1 % (ref 4.6–6.5)

## 2017-10-01 MED ORDER — AMPHETAMINE-DEXTROAMPHETAMINE 10 MG PO TABS: ORAL_TABLET | ORAL | 0 refills | Status: DC

## 2017-10-01 MED ORDER — AMPHETAMINE-DEXTROAMPHET ER 20 MG PO CP24: ORAL_CAPSULE | ORAL | 0 refills | Status: DC

## 2017-10-01 NOTE — Progress Notes (Signed)
OFFICE VISIT  10/01/2017   CC:  Chief Complaint  Patient presents with  . Follow-up    ADD and glucose  . Carpal Tunnel    ?   HPI:    Patient is a 41 y.o. Caucasian male who presents for 4 mo f/u adult ADD. Last visit he was well-controlled on current dosing regimen of adderall xR 40mg  qAM and adderall short acting 10mg  (1-2 tabs) afternoon.  ADD: well controlled on current regimen.  Good focus, concentration, task completion, etc. No adverse side effects.  Also here for recheck of prediabetes (A1c 6.3% April 2018).  Taking cymbalta and xanax for depression/anxiety: doing well. Uses xanax only occasionally: when he flies and occ for insomnia.  Past few months has had numbness and tingling in lower arms and wrists/fingers mainly every morning.  Takes about an hour to resolve. During the rest of his day he notes no paresthesias but if he makes a fist he hurts in wrists some. He does use computer a lot.  No wrist/hand weakness. Paresthesias sometimes in all fingers, sometimes mainly in 1-3 digits.  Past Medical History:  Diagnosis Date  . Acute pancreatitis 05/17/2016   Admitted to hosp while out of town in [place].  Likely secondary to ETOH.  GB/stone w/u NEG.  . Allergy to cats    takes claritin  . Asthma    childhood  . Depression    wellbutrin helped in the past; pt cites failure to respond to prozac, zoloft, and celexa  . Fear of flying    +insomnia; was on xanax regulary for long period and then abruptly stopped it and had w/drawal seizures.  . Hypogonadism male 2012 and 2016   Managed by Dr. Loanne Drilling ([facility]) as of 04/2017  . 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 Era Bumpers)  . Obesity, morbid, BMI 40.0-49.9 ([place])   . Palpitations    24h Holter.: sinus tachycardia  . Prediabetes 01/2017   A1c 6.3%   . Syncope due to orthostatic hypotension    d/c'd from [employer] due to this.  Says if Wt 235 lbs or more then this is not  a problem    History reviewed. No pertinent surgical history.  Outpatient Medications Prior to Visit  Medication Sig Dispense Refill  . ALPRAZolam (XANAX) 1 MG tablet TAKE 1 TO 2 TABLETS BY MOUTH EVERY 8 HOURS AS NEEDED FOR ANXIETY. 30 tablet 5  . DULoxetine (CYMBALTA) 60 MG capsule TAKE ONE CAPSULE BY MOUTH DAILY 30 capsule 6  . levocetirizine (XYZAL) 5 MG tablet Take 5 mg by mouth 2 (two) times daily.    Marland Kitchen PROAIR HFA 108 (90 Base) MCG/ACT inhaler Inhale 1-2 puffs into the lungs every 4 (four) hours as needed for wheezing or shortness of breath. 1 Inhaler 0  . amphetamine-dextroamphetamine (ADDERALL XR) 20 MG 24 hr capsule 2 caps po qAM 60 capsule 0  . amphetamine-dextroamphetamine (ADDERALL) 10 MG tablet 1-2 tabs po q afternoon 60 tablet 0  . HYDROcodone-acetaminophen (NORCO/VICODIN) 5-325 MG tablet Take 1-2 tablets by mouth every 6 (six) hours as needed for moderate pain. (Patient not taking: Reported on 10/01/2017) 20 tablet 0  . ipratropium (ATROVENT) 0.06 % nasal spray     . predniSONE (DELTASONE) 20 MG tablet 2 tabs po qd x 3d, then 1 tab po qd x 3d (Patient not taking: Reported on 10/01/2017) 9 tablet 0   No facility-administered medications prior to visit.     No Known Allergies  ROS As per HPI  PE: Blood pressure 114/78, pulse 82, temperature 98 F (36.7 C), temperature source Oral, resp. rate 16, height 6\' 1"  (1.854 m), weight (!) 303 lb 12 oz (137.8 kg), SpO2 98 %. Gen: Alert, well appearing.  Patient is oriented to person, place, time, and situation. AFFECT: pleasant, lucid thought and speech. Tinels over the ulnar nerve at the elbow elicits some wrist discomfort/paresthesia bilat. Tinel's over median nerve at wrist ++ bilat. Phalen's ++ bilat--all fingers w/paresthesias. No muscle waisting.  Grip strength 5/5 bilat.  LABS:    Chemistry      Component Value Date/Time   NA 137 02/06/2017 1139   K 4.6 02/06/2017 1139   CL 101 02/06/2017 1139   CO2 29 02/06/2017  1139   BUN 11 02/06/2017 1139   CREATININE 1.00 02/06/2017 1139      Component Value Date/Time   CALCIUM 9.7 02/06/2017 1139   ALKPHOS 80 02/06/2017 1139   AST 32 02/06/2017 1139   ALT 42 02/06/2017 1139   BILITOT 0.4 02/06/2017 1139     Lab Results  Component Value Date   HGBA1C 6.3 02/06/2017   Lab Results  Component Value Date   CHOL 166 05/23/2016   HDL 29.60 (L) 05/23/2016   LDLCALC 107 (H) 05/23/2016   TRIG 149.0 05/23/2016   CHOLHDL 6 05/23/2016    IMPRESSION AND PLAN:  1) Adult ADD: The current medical regimen is effective;  continue present plan and medications. I printed rx's for adderall XR 20mg , 2 tabs qAM, #60 and adderall 10mg  short acting, 1-2 tabs q afternoon, #60 today for Jan, Feb, Mar 2019.  Appropriate fill on/after date was noted on each rx. Online [place] controlled substance prescribing data reviewed today: no red flags. Controlled substance contract reviewed with pt, signed, placed in chart today. Will do UDS at a future visit.  2) Depression and situational anxiety: doing well on cymbalta and only rare use of xanax.  3) Prediabetes: encouraged pt to work on diet/exercise/wt loss. HbA1c recheck today. BMET today.  4) Bilat carpal tunnel syndrome: discussed use of wrist splints during sleep, use some during daytime prn. He'll try to get a pair of splints at pharmacy.  An After Visit Summary was printed and given to the patient.   FOLLOW UP: Return in about 6 months (around 04/01/2018) for annual CPE (fasting).  Signed:  Crissie Sickles, MD           10/01/2017

## 2017-10-10 ENCOUNTER — Other Ambulatory Visit: Payer: Self-pay

## 2017-10-10 ENCOUNTER — Ambulatory Visit (HOSPITAL_COMMUNITY)
Admission: EM | Admit: 2017-10-10 | Discharge: 2017-10-10 | Disposition: A | Discharge status: Home / Self Care | Payer: BLUE CROSS/BLUE SHIELD | Attending: Emergency Medicine | Admitting: Emergency Medicine

## 2017-10-10 ENCOUNTER — Encounter (HOSPITAL_COMMUNITY): Payer: Self-pay | Admitting: Emergency Medicine

## 2017-10-10 DIAGNOSIS — S61209A Unspecified open wound of unspecified finger without damage to nail, initial encounter: Secondary | ICD-10-CM | POA: Diagnosis not present

## 2017-10-10 MED ORDER — HYDROCODONE-ACETAMINOPHEN 5-325 MG PO TABS
1.0000 | ORAL_TABLET | Freq: Once | ORAL | Status: AC
Start: 2017-10-10 — End: 2017-10-10
  Administered 2017-10-10: 1 via ORAL

## 2017-10-10 MED ORDER — HYDROCODONE-ACETAMINOPHEN 7.5-325 MG PO TABS: 1.0000 | ORAL_TABLET | ORAL | 0 refills | Status: DC | PRN

## 2017-10-10 MED ORDER — HYDROCODONE-ACETAMINOPHEN 5-325 MG PO TABS
ORAL_TABLET | ORAL | Status: AC
  Filled 2017-10-10: qty 1

## 2017-10-10 NOTE — Discharge Instructions (Signed)
Clean with mild soap and water daily. Apply Neosporin ointment and a dressing. Leave the current dressing on for at least 24 hours. If he starts to bleed hold direct pressure without letting up for couple of hours. Applying ice on top of the pressure dressing can also slow bleeding down. This should heal up nicely without additional treatment. Watch for any signs of infection.

## 2017-10-10 NOTE — ED Provider Notes (Signed)
[place]    CSN: [id number] Arrival date & time: 10/10/17  1659     History   Chief Complaint Chief Complaint  Patient presents with  . Laceration    HPI Philip Daugherty is a 41 y.o. male.   As per nursing note was using an knife to slice fruit he accidentally sliced across the tip of the left index finger. This produced a dermal skin avulsion. This occurred approximately 4:15. Continues to have venous bleeding. Last tetanus shot was in 2013      Past Medical History:  Diagnosis Date  . Acute pancreatitis 05/17/2016   Admitted to hosp while out of town in [place].  Likely secondary to ETOH.  GB/stone w/u NEG.  . Allergy to cats    takes claritin  . Asthma    childhood  . Depression    wellbutrin helped in the past; pt cites failure to respond to prozac, zoloft, and celexa  . Fear of flying    +insomnia; was on xanax regulary for long period and then abruptly stopped it and had w/drawal seizures.  . Hypogonadism male 2012 and 2016   Managed by Dr. Loanne Drilling ([facility]) as of 04/2017  . 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 Era Bumpers)  . Obesity, morbid, BMI 40.0-49.9 ([place])   . Palpitations    24h Holter.: sinus tachycardia  . Prediabetes 01/2017   A1c 6.3%   . Syncope due to orthostatic hypotension    d/c'd from [employer] due to this.  Says if Wt 235 lbs or more then this is not a problem    Patient Active Problem List   Diagnosis Date Noted  . Major depressive disorder, recurrent episode, moderate ([place]) 05/28/2014  . Situational anxiety 03/19/2014  . Right knee pain 10/21/2013  . Asthmatic bronchitis 08/06/2012  . Hypogonadism, male 01/28/2012  . Infertility male 01/28/2012  . Preventative health care 01/28/2012    History reviewed. No pertinent surgical history.     Home Medications    Prior to Admission medications   Medication Sig Start Date End Date Taking? Authorizing Provider  ALPRAZolam  (XANAX) 1 MG tablet TAKE 1 TO 2 TABLETS BY MOUTH EVERY 8 HOURS AS NEEDED FOR ANXIETY. 11/12/16   McGowen, Adrian Blackwater, MD  amphetamine-dextroamphetamine (ADDERALL XR) 20 MG 24 hr capsule 2 caps po qAM 10/01/17   McGowen, Adrian Blackwater, MD  amphetamine-dextroamphetamine (ADDERALL) 10 MG tablet 1-2 tabs po q afternoon 10/01/17   McGowen, Adrian Blackwater, MD  DULoxetine (CYMBALTA) 60 MG capsule TAKE ONE CAPSULE BY MOUTH DAILY 04/29/17   McGowen, Adrian Blackwater, MD  HYDROcodone-acetaminophen (NORCO) 7.5-325 MG tablet Take 1 tablet by mouth every 4 (four) hours as needed. 10/10/17   Janne Napoleon, NP  levocetirizine (XYZAL) 5 MG tablet Take 5 mg by mouth 2 (two) times daily.    [provider]  PROAIR HFA 108 [phone] Base) MCG/ACT inhaler Inhale 1-2 puffs into the lungs every 4 (four) hours as needed for wheezing or shortness of breath. 11/13/16   McGowen, Adrian Blackwater, MD    Family History Family History  Problem Relation Age of Onset  . Depression Mother        bipolar disorder  . Parkinsonism Mother   . Diabetes Father   . Other Father        low testosterone    Social History Social History   Tobacco Use  . Smoking status: Never Smoker  . Smokeless tobacco: Never  Used  Substance Use Topics  . Alcohol use: Yes    Comment: has decreased since 01/04/12  . Drug use: No     Allergies   Patient has no known allergies.   Review of Systems Review of Systems  Constitutional: Negative.   Skin: Positive for wound.  All other systems reviewed and are negative.    Physical Exam Triage Vital Signs ED Triage Vitals  Enc Vitals Group     BP 10/10/17 1846 129/78     Pulse Rate 10/10/17 1846 88     Resp 10/10/17 1846 20     Temp 10/10/17 1846 98.1 F (36.7 C)     Temp Source 10/10/17 1846 Oral     SpO2 10/10/17 1846 100 %     Weight --      Height --      Head Circumference --      Peak Flow --      Pain Score 10/10/17 1845 7     Pain Loc --      Pain Edu? --      Excl. in [place]? --    No data  found.  Updated Vital Signs BP 129/78 (BP Location: Right Arm) Comment (BP Location): large cuff  Pulse 88   Temp 98.1 F (36.7 C) (Oral)   Resp 20   SpO2 100%   Visual Acuity Right Eye Distance:   Left Eye Distance:   Bilateral Distance:    Right Eye Near:   Left Eye Near:    Bilateral Near:     Physical Exam  Constitutional: He is oriented to person, place, and time. He appears well-developed and well-nourished. No distress.  Neck: Normal range of motion. Neck supple.  Cardiovascular: Normal rate.  Pulmonary/Chest: Effort normal.  Neurological: He is alert and oriented to person, place, and time.  Skin: Skin is warm and dry.  Dermal avulsion across the pulp of the left index finger just not involve underlying structures beyond subcutaneous tissue. Does not involve the nail.  Psychiatric: He has a normal mood and affect.  Nursing note and vitals reviewed.    UC Treatments / Results  Labs (all labs ordered are listed, but only abnormal results are displayed) Labs Reviewed - No data to display  EKG  EKG Interpretation None       Radiology No results found.  Procedures Procedures (including critical care time)  Medications Ordered in UC Medications - No data to display   Initial Impression / Assessment and Plan / UC Course  I have reviewed the triage vital signs and the nursing notes.  Pertinent labs & imaging results that were available during my care of the patient were reviewed by me and considered in my medical decision making (see chart for details).    Clean with mild soap and water daily. Apply Neosporin ointment and a dressing. Leave the current dressing on for at least 24 hours. If he starts to bleed hold direct pressure without letting up for couple of hours. Applying ice on top of the pressure dressing can also slow bleeding down. This should heal up nicely without additional treatment. Watch for any signs of infection.  After the wound was  cleaned with saline and soap spray the wound was covered with Surgicel covered with sterile gauze and bandaged. Performed by Olivia Mackie, RN   Final Clinical Impressions(s) / UC Diagnoses   Final diagnoses:  Avulsion of skin of finger, initial encounter    ED Discharge Orders  Ordered    HYDROcodone-acetaminophen (NORCO) 7.5-325 MG tablet  Every 4 hours PRN     10/10/17 1946       Controlled Substance Prescriptions [place] Controlled Substance Registry consulted? Not Applicable   Philander, Ake, NP 10/10/17 [phone]

## 2017-10-10 NOTE — ED Triage Notes (Signed)
Avulsion of skin at tip of left index finger.  Cut this afternoon while slicing an apple with a knife

## 2017-11-30 ENCOUNTER — Other Ambulatory Visit: Payer: Self-pay | Admitting: Family Medicine

## 2017-12-15 ENCOUNTER — Other Ambulatory Visit: Payer: Self-pay | Admitting: Family Medicine

## 2017-12-16 NOTE — Telephone Encounter (Signed)
Rx faxed

## 2017-12-16 NOTE — Telephone Encounter (Signed)
[facility] [address]  RF request for alprazolam LOV: 10/01/17 Next ov: None Last written: 11/12/16 #30 w/ 5RF  Please advise. Thanks.

## 2018-01-02 ENCOUNTER — Encounter: Payer: Self-pay | Admitting: Family Medicine

## 2018-01-02 ENCOUNTER — Ambulatory Visit: Payer: BLUE CROSS/BLUE SHIELD | Admitting: Family Medicine

## 2018-01-02 VITALS — BP 123/84 | HR 105 | Temp 98.1°F | Resp 16 | Ht 73.0 in | Wt 302.5 lb

## 2018-01-02 DIAGNOSIS — F988 Other specified behavioral and emotional disorders with onset usually occurring in childhood and adolescence: Secondary | ICD-10-CM

## 2018-01-02 DIAGNOSIS — Z79899 Other long term (current) drug therapy: Secondary | ICD-10-CM | POA: Diagnosis not present

## 2018-01-02 DIAGNOSIS — F411 Generalized anxiety disorder: Secondary | ICD-10-CM

## 2018-01-02 MED ORDER — ALPRAZOLAM 1 MG PO TABS: ORAL_TABLET | ORAL | 0 refills | Status: DC

## 2018-01-02 MED ORDER — LISDEXAMFETAMINE DIMESYLATE 70 MG PO CAPS
70.0000 mg | ORAL_CAPSULE | Freq: Every day | ORAL | 0 refills | Status: DC
Start: 2018-01-02 — End: 2018-01-08

## 2018-01-02 MED ORDER — DULOXETINE HCL 60 MG PO CPEP: 60.0000 mg | ORAL_CAPSULE | Freq: Every day | ORAL | 1 refills | Status: DC

## 2018-01-02 NOTE — Progress Notes (Signed)
OFFICE VISIT  01/02/2018   CC:  Chief Complaint  Patient presents with  . Follow-up    RCI   HPI:    Patient is a 42 y.o. Caucasian male who presents for 3 mo f/u adult ADD and chronic anxiety and depression.  Although at past visits pt reported that all has been going well with the med at current dosing (much improved focus, concentration, task completion.  Less frustration, better multitasking, less impulsivity and restlessness.  Mood stable), he feels like he is getting tolerant to adderall.  Has been on this med a long time.  Has been talking to a coworker who takes vyvanse and pt asks if he can try switching to this med.  Pt's current adderall xr dosing is 40mg  qAM and adderall short acting is 20mg  q afternoon. Most recent dose was this morning.  Mood/anxiety: still feels that duloxetine and prn alprazolam are doing very well for him. Takes an otc sleep aid called Alteril which he says helps him.  ROS: no CP, no HAs, no palpitations, no dizziness, no tremulousness, no SOB, no HI or SI.  Past Medical History:  Diagnosis Date  . Acute pancreatitis 05/17/2016   Admitted to hosp while out of town in [place].  Likely secondary to ETOH.  GB/stone w/u NEG.  . Allergy to cats    takes claritin  . Asthma    childhood  . Depression    wellbutrin helped in the past; pt cites failure to respond to prozac, zoloft, and celexa  . Fear of flying    +insomnia; was on xanax regulary for long period and then abruptly stopped it and had w/drawal seizures.  . Hypogonadism male 2012 and 2016   Managed by Dr. Loanne Drilling ([facility]) as of 04/2017  . 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 Era Bumpers)  . Obesity, morbid, BMI 40.0-49.9 ([place])   . Palpitations    24h Holter.: sinus tachycardia  . Prediabetes 01/2017   A1c 6.3% ; 6.1% Dec 2018.  Marland Kitchen Syncope due to orthostatic hypotension    d/c'd from [employer] due to this.  Says if Wt 235 lbs or more then this  is not a problem    History reviewed. No pertinent surgical history.  Outpatient Medications Prior to Visit  Medication Sig Dispense Refill  . levocetirizine (XYZAL) 5 MG tablet Take 5 mg by mouth 2 (two) times daily.    Marland Kitchen PROAIR HFA 108 (90 Base) MCG/ACT inhaler Inhale 1-2 puffs into the lungs every 4 (four) hours as needed for wheezing or shortness of breath. 1 Inhaler 0  . ALPRAZolam (XANAX) 1 MG tablet TAKE 1 TO 2 TABLETS BY MOUTH EVERY 8 HOURS AS NEEDED FOR ANXIETY. 30 tablet 5  . amphetamine-dextroamphetamine (ADDERALL XR) 20 MG 24 hr capsule 2 caps po qAM 60 capsule 0  . amphetamine-dextroamphetamine (ADDERALL) 10 MG tablet 1-2 tabs po q afternoon 60 tablet 0  . DULoxetine (CYMBALTA) 60 MG capsule TAKE ONE CAPSULE BY MOUTH DAILY 30 capsule 3  . HYDROcodone-acetaminophen (NORCO) 7.5-325 MG tablet Take 1 tablet by mouth every 4 (four) hours as needed. (Patient not taking: Reported on 01/02/2018) 10 tablet 0   No facility-administered medications prior to visit.     No Known Allergies  ROS As per HPI  PE: Blood pressure 123/84, pulse (!) 105, temperature 98.1 F (36.7 C), temperature source Oral, resp. rate 16, height 6\' 1"  (1.854 m), weight (!) 302 lb 8 oz (137.2 kg),  SpO2 99 %. Wt Readings from Last 2 Encounters:  01/02/18 (!) 302 lb 8 oz (137.2 kg)  10/01/17 (!) 303 lb 12 oz (137.8 kg)    Gen: alert, oriented x 4, affect pleasant.  Lucid thinking and conversation noted. HEENT: PERRLA, EOMI.   Neck: no LAD, mass, or thyromegaly. CV: RRR, no m/r/g LUNGS: CTA bilat, nonlabored. NEURO: no tremor or tics noted on observation.  Coordination intact. CN 2-12 grossly intact bilaterally, strength 5/5 in all extremeties.  No ataxia.   LABS:    Chemistry      Component Value Date/Time   NA 138 10/01/2017 1156   K 4.6 10/01/2017 1156   CL 99 10/01/2017 1156   CO2 32 10/01/2017 1156   BUN 11 10/01/2017 1156   CREATININE 0.88 10/01/2017 1156      Component Value Date/Time    CALCIUM 9.4 10/01/2017 1156   ALKPHOS 80 02/06/2017 1139   AST 32 02/06/2017 1139   ALT 42 02/06/2017 1139   BILITOT 0.4 02/06/2017 1139     Lab Results  Component Value Date   HGBA1C 6.1 10/01/2017   Lab Results  Component Value Date   CHOL 166 05/23/2016   HDL 29.60 (L) 05/23/2016   LDLCALC 107 (H) 05/23/2016   TRIG 149.0 05/23/2016   CHOLHDL 6 05/23/2016    IMPRESSION AND PLAN:  1) Adult ADD: waning efficacy of adderall xr and adderall IR. Change to vyvanse 70 mg qd. Therapeutic expectations and side effect profile of medication discussed today.  Patient's questions answered. I printed rx today for vyvanse 70mg , 1 cap qd, #90 (90 day supply--pt is about to change jobs and temporarily be w/out insurance).  UDS today. Controlled substance contract is UTD.  2) Anxiety and depression: doing very well on duloxetine and prn alprazolam. 90 day supply of each of these meds done today.  An After Visit Summary was printed and given to the patient.  FOLLOW UP: Return in about 6 months (around 07/05/2018) for routine chronic illness f/u.  Signed:  Crissie Sickles, MD           01/02/2018

## 2018-01-05 LAB — PAIN MGMT, PROFILE 8 W/CONF, U
6 ACETYLMORPHINE: NEGATIVE ng/mL (ref <10)
ALPHAHYDROXYALPRAZOLAM: 79 ng/mL — AB (ref <25)
ALPHAHYDROXYTRIAZOLAM: NEGATIVE ng/mL (ref <50)
AMINOCLONAZEPAM: NEGATIVE ng/mL (ref <25)
AMPHETAMINE: 8489 ng/mL — AB (ref <250)
Alcohol Metabolites: POSITIVE ng/mL — AB (ref <500)
Alphahydroxymidazolam: NEGATIVE ng/mL (ref <50)
Amphetamines: POSITIVE ng/mL — AB (ref <500)
BENZODIAZEPINES: POSITIVE ng/mL — AB (ref <100)
BUPRENORPHINE, URINE: NEGATIVE ng/mL (ref <5)
CREATININE: 118.9 mg/dL
Cocaine Metabolite: NEGATIVE ng/mL (ref <150)
Ethyl Glucuronide (ETG): 7703 ng/mL — ABNORMAL HIGH (ref <500)
Ethyl Sulfate (ETS): 1263 ng/mL — ABNORMAL HIGH (ref <100)
HYDROXYETHYLFLURAZEPAM: NEGATIVE ng/mL (ref <50)
Lorazepam: NEGATIVE ng/mL (ref <50)
MARIJUANA METABOLITE: NEGATIVE ng/mL (ref <20)
MDMA: NEGATIVE ng/mL (ref <500)
Methamphetamine: NEGATIVE ng/mL (ref <250)
Nordiazepam: NEGATIVE ng/mL (ref <50)
OXYCODONE: NEGATIVE ng/mL (ref <100)
Opiates: NEGATIVE ng/mL (ref <100)
Oxazepam: NEGATIVE ng/mL (ref <50)
Oxidant: NEGATIVE ug/mL (ref <200)
TEMAZEPAM: NEGATIVE ng/mL (ref <50)
pH: 6.42 (ref 4.5–9.0)

## 2018-01-06 ENCOUNTER — Encounter: Payer: Self-pay | Admitting: *Deleted

## 2018-01-07 ENCOUNTER — Telehealth: Payer: Self-pay | Admitting: Family Medicine

## 2018-01-07 NOTE — Telephone Encounter (Signed)
Contacted pt regarding his request to talk with Dr Anitra Lauth; he states that the medication wears off much more quickly than Dr Anitra Lauth stated; he says that he feels the effects of the medication for 6 hours instead of the 14 hours; he further states that it feels like a crash; he was seen in the office on 01/02/18 and prescribed vyvanse;  the pt would like to perhaps change the frequency to twice daily. If the the medication is changed, he will need an updated prescription; his pharmacy is [facility] [address], [place]; also the pt can be contacted at [phone]; will route to office for provider review.

## 2018-01-07 NOTE — Telephone Encounter (Signed)
Please advise. Thanks.  

## 2018-01-07 NOTE — Telephone Encounter (Signed)
Copied from [place] [id number]. Topic: Quick Communication - See Telephone Encounter >> Jan 07, 2018  9:34 AM Hewitt Shorts wrote: CRM for notification. See Telephone encounter for: 01/07/18.pt is needing to talk with Mcgowen regarding the dosage of the the vyvanse and if it needs to be increased he will need a new rx Best number [phone]

## 2018-01-08 NOTE — Telephone Encounter (Signed)
He is on the maximum dose of vyvanse. The duration of action should be around 12 hours. This med cannot be taken twice per day. It sounds like his best option is to get some expert mgmt by an ADD specialist in [place].  If he is agreeable, I'll make referral (Anon Raices).  No new rx will be done at this time.-thx

## 2018-01-08 NOTE — Telephone Encounter (Signed)
Pt advised and voiced understanding. He stated that he will not have insurance for about 3 months so he will not be able to see a specialist. He stated that he would like to go back to the Adderall since that seemed to work better for him. Please advise. Thanks.

## 2018-01-08 NOTE — Telephone Encounter (Signed)
I won't do any rx's for adderall until 90 days have passed. I have to follow appropriate controlled substance prescribing rules. I cannot rx adderall just a few days after I rx'd a 90 day supply of vyvanse.  He will have to take his vyvanse until he runs out in 90d, then I can switch him back to adderall at that time if he still wants to.  -thx

## 2018-01-09 NOTE — Telephone Encounter (Signed)
Pt advised and voiced understanding.   

## 2018-04-28 ENCOUNTER — Telehealth: Payer: Self-pay | Admitting: Family Medicine

## 2018-04-28 NOTE — Telephone Encounter (Signed)
Pt requesting refill of Adderall Not on current med profile.   LOV 01/02/18 Dr. Anitra Lauth  LRF 01/02/18  #60  0 refills

## 2018-04-28 NOTE — Telephone Encounter (Signed)
Please advise. Thanks.  

## 2018-04-28 NOTE — Telephone Encounter (Signed)
Pt advised and voiced understanding.   Apt made for 04/30/18 at 8:00am.

## 2018-04-28 NOTE — Telephone Encounter (Signed)
(  When I last saw him on 01/02/18 I changed him from adderall to vyvanse at his request.)  He is due for office f/u of this problem before I rx any further meds for it.-thx

## 2018-04-28 NOTE — Telephone Encounter (Signed)
Copied from [place] [id number] [id number]. Topic: Quick Communication - See Telephone Encounter >> Apr 28, 2018  9:51 AM Mylinda Latina, NT wrote: CRM for notification. See Telephone encounter for: 04/28/18. Patient called and states he needs a refill of his Adderrall . This medication is not on his current med list. Pt states he usually pick up ap copy. Please call when the rx is ready for pick up CB# [phone]

## 2018-04-30 ENCOUNTER — Ambulatory Visit: Payer: BLUE CROSS/BLUE SHIELD | Admitting: Family Medicine

## 2018-04-30 DIAGNOSIS — Z0289 Encounter for other administrative examinations: Secondary | ICD-10-CM

## 2018-04-30 NOTE — Progress Notes (Deleted)
OFFICE VISIT  04/30/2018   CC: No chief complaint on file.    HPI:    Patient is a 42 y.o. Caucasian male who presents for f/u adult ADD. Last f/u about 4 mo ago we had changed him from adderall to vyvanse at his request, in attempt to find a treatment that provided appropriate efficacy for an appropriate duration. (He felt like he was getting tolerant to the adderall.  He was taking adderall xr 40mg  qAM and adderall short acting 20mg  q afternoon).  Past Medical History:  Diagnosis Date  . Acute pancreatitis 05/17/2016   Admitted to hosp while out of town in [place].  Likely secondary to ETOH.  GB/stone w/u NEG.  . Allergy to cats    takes claritin  . Asthma    childhood  . Depression    wellbutrin helped in the past; pt cites failure to respond to prozac, zoloft, and celexa  . Fear of flying    +insomnia; was on xanax regulary for long period and then abruptly stopped it and had w/drawal seizures.  . Hypogonadism male 2012 and 2016   Managed by Dr. Loanne Drilling ([facility]) as of 04/2017  . 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 Era Bumpers)  . Obesity, morbid, BMI 40.0-49.9 ([place])   . Palpitations    24h Holter.: sinus tachycardia  . Prediabetes 01/2017   A1c 6.3% ; 6.1% Dec 2018.  Marland Kitchen Syncope due to orthostatic hypotension    d/c'd from [employer] due to this.  Says if Wt 235 lbs or more then this is not a problem    No past surgical history on file.  Outpatient Medications Prior to Visit  Medication Sig Dispense Refill  . ALPRAZolam (XANAX) 1 MG tablet TAKE 1 TO 2 TABLETS BY MOUTH EVERY 8 HOURS AS NEEDED FOR ANXIETY. 90 tablet 0  . DULoxetine (CYMBALTA) 60 MG capsule Take 1 capsule (60 mg total) by mouth daily. 90 capsule 1  . levocetirizine (XYZAL) 5 MG tablet Take 5 mg by mouth 2 (two) times daily.    Marland Kitchen PROAIR HFA 108 (90 Base) MCG/ACT inhaler Inhale 1-2 puffs into the lungs every 4 (four) hours as needed for wheezing or shortness of  breath. 1 Inhaler 0   No facility-administered medications prior to visit.     No Known Allergies  ROS As per HPI  PE: There were no vitals taken for this visit. ***  LABS:    Chemistry      Component Value Date/Time   NA 138 10/01/2017 1156   K 4.6 10/01/2017 1156   CL 99 10/01/2017 1156   CO2 32 10/01/2017 1156   BUN 11 10/01/2017 1156   CREATININE 0.88 10/01/2017 1156      Component Value Date/Time   CALCIUM 9.4 10/01/2017 1156   ALKPHOS 80 02/06/2017 1139   AST 32 02/06/2017 1139   ALT 42 02/06/2017 1139   BILITOT 0.4 02/06/2017 1139     Lab Results  Component Value Date   CHOL 166 05/23/2016   HDL 29.60 (L) 05/23/2016   LDLCALC 107 (H) 05/23/2016   TRIG 149.0 05/23/2016   CHOLHDL 6 05/23/2016     IMPRESSION AND PLAN:  No problem-specific Assessment & Plan notes found for this encounter.   An After Visit Summary was printed and given to the patient.  FOLLOW UP: No follow-ups on file.due for CPE  Signed:  Crissie Sickles, MD  04/30/2018     

## 2018-05-02 ENCOUNTER — Encounter: Payer: Self-pay | Admitting: Family Medicine

## 2018-05-02 ENCOUNTER — Ambulatory Visit: Payer: BLUE CROSS/BLUE SHIELD | Admitting: Family Medicine

## 2018-05-02 VITALS — BP 109/74 | HR 74 | Temp 98.3°F | Resp 16 | Ht 73.0 in | Wt 308.0 lb

## 2018-05-02 DIAGNOSIS — F988 Other specified behavioral and emotional disorders with onset usually occurring in childhood and adolescence: Secondary | ICD-10-CM

## 2018-05-02 MED ORDER — AMPHETAMINE-DEXTROAMPHET ER 30 MG PO CP24: 30.0000 mg | ORAL_CAPSULE | ORAL | 0 refills | Status: DC

## 2018-05-02 MED ORDER — AMPHETAMINE-DEXTROAMPHETAMINE 10 MG PO TABS: ORAL_TABLET | ORAL | 0 refills | Status: DC

## 2018-05-02 NOTE — Progress Notes (Signed)
OFFICE VISIT  05/02/2018   CC:  Chief Complaint  Patient presents with  . Follow-up    RCI   HPI:    Patient is a 42 y.o. Caucasian male who presents for f/u adult ADD. Last f/u 12/2017 pt wished to try switching from adderall regimen to vyvanse. Vyvanse was not as effective. He has gone back to taking adderall now: 20mg  ER qAM and 1-2 of the 10mg  IR/short acting in afternoons. Pt states all is going prettty well with the med at current dosing but he feels like he has developed some tolerance : still improved focus, concentration, task completion.  Still less frustration, better multitasking, less impulsivity and restlessness.  Mood is stable. No side effects from the medication. He is interested in increasing his adderall XR morning dose to 30mg .    Past Medical History:  Diagnosis Date  . Acute pancreatitis 05/17/2016   Admitted to hosp while out of town in [place].  Likely secondary to ETOH.  GB/stone w/u NEG.  . Allergy to cats    takes claritin  . Asthma    childhood  . Depression    wellbutrin helped in the past; pt cites failure to respond to prozac, zoloft, and celexa  . Fear of flying    +insomnia; was on xanax regulary for long period and then abruptly stopped it and had w/drawal seizures.  . Hypogonadism male 2012 and 2016   Managed by Dr. Loanne Drilling ([facility]) as of 04/2017  . 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 Era Bumpers)  . Obesity, morbid, BMI 40.0-49.9 ([place])   . Palpitations    24h Holter.: sinus tachycardia  . Prediabetes 01/2017   A1c 6.3% ; 6.1% Dec 2018.  Marland Kitchen Syncope due to orthostatic hypotension    d/c'd from [employer] due to this.  Says if Wt 235 lbs or more then this is not a problem    History reviewed. No pertinent surgical history.  Outpatient Medications Prior to Visit  Medication Sig Dispense Refill  . ALPRAZolam (XANAX) 1 MG tablet TAKE 1 TO 2 TABLETS BY MOUTH EVERY 8 HOURS AS NEEDED FOR ANXIETY. 90  tablet 0  . DULoxetine (CYMBALTA) 60 MG capsule Take 1 capsule (60 mg total) by mouth daily. 90 capsule 1  . levocetirizine (XYZAL) 5 MG tablet Take 5 mg by mouth 2 (two) times daily.    Marland Kitchen PROAIR HFA 108 (90 Base) MCG/ACT inhaler Inhale 1-2 puffs into the lungs every 4 (four) hours as needed for wheezing or shortness of breath. 1 Inhaler 0   No facility-administered medications prior to visit.     No Known Allergies  ROS As per HPI  PE: Blood pressure 109/74, pulse 74, temperature 98.3 F (36.8 C), temperature source Oral, resp. rate 16, height 6\' 1"  (1.854 m), weight (!) 308 lb (139.7 kg), SpO2 96 %. Body mass index is 40.64 kg/m.  Wt Readings from Last 2 Encounters:  05/02/18 (!) 308 lb (139.7 kg)  01/02/18 (!) 302 lb 8 oz (137.2 kg)    Gen: alert, oriented x 4, affect pleasant.  Lucid thinking and conversation noted. HEENT: PERRLA, EOMI.   Neck: no LAD, mass, or thyromegaly. CV: RRR, no m/r/g LUNGS: CTA bilat, nonlabored. NEURO: no tremor or tics noted on observation.  Coordination intact. CN 2-12 grossly intact bilaterally, strength 5/5 in all extremeties.  No ataxia.   LABS:    Chemistry      Component Value Date/Time   NA  138 10/01/2017 1156   K 4.6 10/01/2017 1156   CL 99 10/01/2017 1156   CO2 32 10/01/2017 1156   BUN 11 10/01/2017 1156   CREATININE 0.88 10/01/2017 1156      Component Value Date/Time   CALCIUM 9.4 10/01/2017 1156   ALKPHOS 80 02/06/2017 1139   AST 32 02/06/2017 1139   ALT 42 02/06/2017 1139   BILITOT 0.4 02/06/2017 1139       IMPRESSION AND PLAN:  Adult ADD: pretty well controlled but he likely does have some tolerance built up to adderall. Will increase to adderall xr to 30mg  qAM and continue adderall 10mg  IR/short acting to 1-2 tabs every afternoon. 90 day supply of each med eRx'd today. CSC UTD. UDS UTD.  An After Visit Summary was printed and given to the patient.  FOLLOW UP: Return in about 3 months (around 08/02/2018) for  f/u ADD.  Signed:  Crissie Sickles, MD           05/02/2018

## 2018-07-24 ENCOUNTER — Ambulatory Visit: Payer: BLUE CROSS/BLUE SHIELD | Admitting: Family Medicine

## 2018-07-24 ENCOUNTER — Other Ambulatory Visit: Payer: Self-pay | Admitting: Family Medicine

## 2018-07-24 ENCOUNTER — Encounter: Payer: Self-pay | Admitting: Family Medicine

## 2018-07-24 VITALS — BP 121/77 | HR 78 | Temp 98.1°F | Resp 16 | Ht 73.0 in | Wt 311.4 lb

## 2018-07-24 DIAGNOSIS — Z23 Encounter for immunization: Secondary | ICD-10-CM

## 2018-07-24 DIAGNOSIS — E291 Testicular hypofunction: Secondary | ICD-10-CM | POA: Diagnosis not present

## 2018-07-24 LAB — LUTEINIZING HORMONE: LH: 3.78 m[IU]/mL (ref 1.50–9.30)

## 2018-07-24 LAB — FOLLICLE STIMULATING HORMONE: FSH: 7.9 m[IU]/mL (ref 1.4–18.1)

## 2018-07-24 LAB — TSH: TSH: 2.43 u[IU]/mL (ref 0.35–4.50)

## 2018-07-24 MED ORDER — AMPHETAMINE-DEXTROAMPHETAMINE 10 MG PO TABS: ORAL_TABLET | ORAL | 0 refills | Status: DC

## 2018-07-24 MED ORDER — AMPHETAMINE-DEXTROAMPHET ER 30 MG PO CP24: 30.0000 mg | ORAL_CAPSULE | ORAL | 0 refills | Status: DC

## 2018-07-24 NOTE — Progress Notes (Signed)
OFFICE VISIT  07/24/2018   CC:  Chief Complaint  Patient presents with  . Follow-up    ADHD     HPI:    Patient is a 42 y.o. Caucasian male who presents for 3 mo f/u adult ADD, GAD, hx of MDD. Last visit I increased his morning adderall xr to 30mg  due to some tolerance he had developed. I continued him on 1-2 of the 10mg  "plain" adderall in afternoon prn.  ADHD:  Pt states all is going well with the med at current dosing: much improved focus, concentration, task completion.  Less frustration, better multitasking, less impulsivity and restlessness.  Mood is stable. No side effects from the medication. Still struggling with feeling fatigued.  Has long hx of heat intolerance.    Anx/dep: has been on cymbalta 60mg  qd and xanax 1mg  tid prn long term. Well controlled.  Pt with long hx of low testost: topicals did not bring testost level up signif and he felt no improvement in libido or energy level.  He can't recall very well about how the injections helped him feel, nor can he recall side effects.  He was briefly seen by endo for this, but then never followed up b/c he was never contacted by endo as they had promised to. He is desiring to get back on testosterone supplement if clinically appropriat. Libido is still soemwhat down, energy level moderately poor, sleep is impaired.   Past Medical History:  Diagnosis Date  . Acute pancreatitis 05/17/2016   Admitted to hosp while out of town in [place].  Likely secondary to ETOH.  GB/stone w/u NEG.  . Allergy to cats    takes claritin  . Asthma    childhood  . Depression    wellbutrin helped in the past; pt cites failure to respond to prozac, zoloft, and celexa  . Fear of flying    +insomnia; was on xanax regulary for long period and then abruptly stopped it and had w/drawal seizures.  . Hypogonadism male 2012 and 2016   Managed by Dr. Loanne Drilling ([facility]) as of 04/2017  . 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 Era Bumpers)  . Obesity, morbid, BMI 40.0-49.9 ([place])   . Palpitations    24h Holter.: sinus tachycardia  . Prediabetes 01/2017   A1c 6.3% ; 6.1% Dec 2018.  Marland Kitchen Syncope due to orthostatic hypotension    d/c'd from [employer] due to this.  Says if Wt 235 lbs or more then this is not a problem    No past surgical history on file.  Outpatient Medications Prior to Visit  Medication Sig Dispense Refill  . ALPRAZolam (XANAX) 1 MG tablet TAKE 1 TO 2 TABLETS BY MOUTH EVERY 8 HOURS AS NEEDED FOR ANXIETY. 90 tablet 0  . amphetamine-dextroamphetamine (ADDERALL XR) 30 MG 24 hr capsule Take 1 capsule (30 mg total) by mouth every morning. 90 capsule 0  . amphetamine-dextroamphetamine (ADDERALL) 10 MG tablet 1-2 tabs po q afternoon 180 tablet 0  . DULoxetine (CYMBALTA) 60 MG capsule Take 1 capsule (60 mg total) by mouth daily. 90 capsule 1  . levocetirizine (XYZAL) 5 MG tablet Take 5 mg by mouth 2 (two) times daily.    Marland Kitchen PROAIR HFA 108 (90 Base) MCG/ACT inhaler Inhale 1-2 puffs into the lungs every 4 (four) hours as needed for wheezing or shortness of breath. 1 Inhaler 0   No facility-administered medications prior to visit.     No Known Allergies  ROS As per HPI  PE: Blood pressure 121/77, pulse 78, temperature 98.1 F (36.7 C), temperature source Oral, resp. rate 16, height 6\' 1"  (1.854 m), weight (!) 311 lb 6 oz (141.2 kg), SpO2 98 %. Wt Readings from Last 2 Encounters:  07/24/18 (!) 311 lb 6 oz (141.2 kg)  05/02/18 (!) 308 lb (139.7 kg)    Gen: alert, oriented x 4, affect pleasant.  Lucid thinking and conversation noted. HEENT: PERRLA, EOMI.   Neck: no LAD, mass, or thyromegaly. CV: RRR, no m/r/g LUNGS: CTA bilat, nonlabored. NEURO: no tremor or tics noted on observation.  Coordination intact. CN 2-12 grossly intact bilaterally, strength 5/5 in all extremeties.  No ataxia.   LABS:   Lab Results  Component Value Date   TSH 2.48 04/10/2017      Chemistry       Component Value Date/Time   NA 138 10/01/2017 1156   K 4.6 10/01/2017 1156   CL 99 10/01/2017 1156   CO2 32 10/01/2017 1156   BUN 11 10/01/2017 1156   CREATININE 0.88 10/01/2017 1156      Component Value Date/Time   CALCIUM 9.4 10/01/2017 1156   ALKPHOS 80 02/06/2017 1139   AST 32 02/06/2017 1139   ALT 42 02/06/2017 1139   BILITOT 0.4 02/06/2017 1139       IMPRESSION AND PLAN:  1) Adut ADHD: The current medical regimen is effective;  continue present plan and medications. I did electronic rx's for adderall xr, 1 qAM, #90 for 3 mo supply, and adderall 10mg , 1-2 q afternoon, #180 (90 d supply). .   CSC UTD but will need to be renewed at next f/u visit.  UDS needed after 12/2018.  2) Anxiety and depression: stable on cymbalta and alprazolam. See #1 above regarding CSC.  3) Male hypogonadism: recheck testosterone panel today.  I agreed to take over mgmt of his hypogonadism again. Will await testost level before choosing replacement method/dose at this time. Will also draw TSH, prolactin, LH, FSH levels.  An After Visit Summary was printed and given to the patient.  FOLLOW UP: 3 mo CPE  Signed:  Crissie Sickles, MD           07/24/2018

## 2018-07-25 LAB — TESTOSTERONE TOTAL,FREE,BIO, MALES
ALBUMIN MSPROF: 4.4 g/dL (ref 3.6–5.1)
Sex Hormone Binding: 10 nmol/L (ref 10–50)
Testosterone: 169 ng/dL — ABNORMAL LOW (ref 250–827)

## 2018-07-25 LAB — PROLACTIN: Prolactin: 5.9 ng/mL (ref 2.0–18.0)

## 2018-07-25 NOTE — Telephone Encounter (Signed)
LOV: 07/24/18 NOV: None  Last Written: Alprazolam: 01/02/18 #90 w/ 0RF Duloxetine: 01/02/18 #90 w/ 1RF  Please advise. Thanks.

## 2018-07-27 ENCOUNTER — Encounter: Payer: Self-pay | Admitting: Family Medicine

## 2018-07-28 ENCOUNTER — Encounter: Payer: Self-pay | Admitting: Family Medicine

## 2018-07-30 NOTE — Telephone Encounter (Signed)
Pt asking about lab results. Please advise. Thanks.

## 2018-07-31 ENCOUNTER — Other Ambulatory Visit: Payer: Self-pay | Admitting: *Deleted

## 2018-07-31 ENCOUNTER — Encounter: Payer: Self-pay | Admitting: Family Medicine

## 2018-07-31 MED ORDER — "SYRINGE 18G X 1-1/2"" 3 ML MISC": 0 refills | Status: DC

## 2018-07-31 MED ORDER — TESTOSTERONE CYPIONATE 200 MG/ML IM SOLN: INTRAMUSCULAR | 0 refills | Status: DC

## 2018-07-31 MED ORDER — "NEEDLE (DISP) 21G X 1-1/2"" MISC": 0 refills | Status: DC

## 2018-07-31 NOTE — Telephone Encounter (Signed)
I sent in rx for testosterone. See if he plans to do self injections or does he need to set up office injections.-thx

## 2018-10-29 ENCOUNTER — Telehealth: Payer: BLUE CROSS/BLUE SHIELD | Admitting: Nurse Practitioner

## 2018-10-29 DIAGNOSIS — R05 Cough: Secondary | ICD-10-CM | POA: Diagnosis not present

## 2018-10-29 DIAGNOSIS — R059 Cough, unspecified: Secondary | ICD-10-CM

## 2018-10-29 DIAGNOSIS — J029 Acute pharyngitis, unspecified: Secondary | ICD-10-CM | POA: Diagnosis not present

## 2018-10-29 MED ORDER — DOXYCYCLINE HYCLATE 100 MG PO TABS: 100.0000 mg | ORAL_TABLET | Freq: Two times a day (BID) | ORAL | 0 refills | Status: DC

## 2018-10-29 NOTE — Progress Notes (Signed)
We are sorry that you are not feeling well.  Here is how we plan to help!  Based on your presentation I believe you most likely have A cough due to bacteria.  When patients have a fever and a productive cough with a change in color or increased sputum production, we are concerned about bacterial bronchitis.  If left untreated it can progress to pneumonia.  If your symptoms do not improve with your treatment plan it is important that you contact your provider.   I have prescribed Doxycycline 100 mg twice a day for 7 days     In addition you may use A non-prescription cough medication called Mucinex DM: take 2 tablets every 12 hours.   From your responses in the eVisit questionnaire you describe inflammation in the upper respiratory tract which is causing a significant cough.  This is commonly called Bronchitis and has four common causes:    Allergies  Viral Infections  Acid Reflux  Bacterial Infection Allergies, viruses and acid reflux are treated by controlling symptoms or eliminating the cause. An example might be a cough caused by taking certain blood pressure medications. You stop the cough by changing the medication. Another example might be a cough caused by acid reflux. Controlling the reflux helps control the cough.  USE OF BRONCHODILATOR ("RESCUE") INHALERS: There is a risk from using your bronchodilator too frequently.  The risk is that over-reliance on a medication which only relaxes the muscles surrounding the breathing tubes can reduce the effectiveness of medications prescribed to reduce swelling and congestion of the tubes themselves.  Although you feel brief relief from the bronchodilator inhaler, your asthma may actually be worsening with the tubes becoming more swollen and filled with mucus.  This can delay other crucial treatments, such as oral steroid medications. If you need to use a bronchodilator inhaler daily, several times per day, you should discuss this with your  provider.  There are probably better treatments that could be used to keep your asthma under control.     HOME CARE . Only take medications as instructed by your medical team. . Complete the entire course of an antibiotic. . Drink plenty of fluids and get plenty of rest. . Avoid close contacts especially the very young and the elderly . Cover your mouth if you cough or cough into your sleeve. . Always remember to wash your hands . A steam or ultrasonic humidifier can help congestion.   GET HELP RIGHT AWAY IF: . You develop worsening fever. . You become short of breath . You cough up blood. . Your symptoms persist after you have completed your treatment plan MAKE SURE YOU   Understand these instructions.  Will watch your condition.  Will get help right away if you are not doing well or get worse.  Your e-visit answers were reviewed by a board certified advanced clinical practitioner to complete your personal care plan.  Depending on the condition, your plan could have included both over the counter or prescription medications. If there is a problem please reply  once you have received a response from your provider. Your safety is important to us.  If you have drug allergies check your prescription carefully.    You can use MyChart to ask questions about today's visit, request a non-urgent call back, or ask for a work or school excuse for 24 hours related to this e-Visit. If it has been greater than 24 hours you will need to follow up with your provider,   or enter a new e-Visit to address those concerns. You will get an e-mail in the next two days asking about your experience.  I hope that your e-visit has been valuable and will speed your recovery. Thank you for using e-visits.   

## 2018-11-26 ENCOUNTER — Other Ambulatory Visit: Payer: Self-pay | Admitting: Family Medicine

## 2018-11-26 NOTE — Telephone Encounter (Addendum)
Copied from [address] [id number]. Topic: Quick Communication - See Telephone Encounter >> Nov 26, 2018  2:57 PM Ivar Drape wrote: CRM for notification. See Telephone encounter for: 11/26/18. Patient would like a 90 day refill on the following medications and have them sent to his preferred pharmacy [facility] on [address]  1) amphetamine-dextroamphetamine (ADDERALL XR) 30 MG 24 hr capsule   2) amphetamine-dextroamphetamine (ADDERALL) 10 MG tablet

## 2018-11-26 NOTE — Telephone Encounter (Signed)
RF request for adderall 30mg  LOV: 07/24/18 Next ov: None Last written: 07/24/18 #90 w/ 0RF  RF request for adderall 10mg  Last written: 07/24/18 #180 w/ 0RF   1 Rx for each was given on 07/24/18.  Please advise. Thanks.

## 2018-11-28 MED ORDER — AMPHETAMINE-DEXTROAMPHET ER 30 MG PO CP24: 30.0000 mg | ORAL_CAPSULE | ORAL | 0 refills | Status: DC

## 2018-11-28 MED ORDER — AMPHETAMINE-DEXTROAMPHETAMINE 10 MG PO TABS: ORAL_TABLET | ORAL | 0 refills | Status: DC

## 2019-02-16 ENCOUNTER — Other Ambulatory Visit: Payer: Self-pay | Admitting: Family Medicine

## 2019-03-06 ENCOUNTER — Encounter: Payer: Self-pay | Admitting: Family Medicine

## 2019-03-11 ENCOUNTER — Encounter: Payer: Self-pay | Admitting: Family Medicine

## 2019-03-11 ENCOUNTER — Other Ambulatory Visit: Payer: Self-pay

## 2019-03-11 ENCOUNTER — Ambulatory Visit (INDEPENDENT_AMBULATORY_CARE_PROVIDER_SITE_OTHER): Payer: BLUE CROSS/BLUE SHIELD | Admitting: Family Medicine

## 2019-03-11 VITALS — Temp 98.6°F

## 2019-03-11 DIAGNOSIS — E291 Testicular hypofunction: Secondary | ICD-10-CM

## 2019-03-11 DIAGNOSIS — F411 Generalized anxiety disorder: Secondary | ICD-10-CM

## 2019-03-11 DIAGNOSIS — F988 Other specified behavioral and emotional disorders with onset usually occurring in childhood and adolescence: Secondary | ICD-10-CM

## 2019-03-11 DIAGNOSIS — F3342 Major depressive disorder, recurrent, in full remission: Secondary | ICD-10-CM

## 2019-03-11 MED ORDER — DULOXETINE HCL 60 MG PO CPEP: 60.0000 mg | ORAL_CAPSULE | Freq: Every day | ORAL | 3 refills | Status: DC

## 2019-03-11 MED ORDER — TESTOSTERONE CYPIONATE 200 MG/ML IM SOLN: INTRAMUSCULAR | 0 refills | Status: DC

## 2019-03-11 MED ORDER — AMPHETAMINE-DEXTROAMPHET ER 30 MG PO CP24: 30.0000 mg | ORAL_CAPSULE | ORAL | 0 refills | Status: DC

## 2019-03-11 MED ORDER — AMPHETAMINE-DEXTROAMPHETAMINE 10 MG PO TABS: ORAL_TABLET | ORAL | 0 refills | Status: DC

## 2019-03-11 NOTE — Progress Notes (Signed)
Virtual Visit via Video Note  I connected with pt on 03/11/19 at  1:00 PM EDT by a video enabled telemedicine application and verified that I am speaking with the correct person using two identifiers.  Location patient: home Location provider:work or home office Persons participating in the virtual visit: patient, provider  I discussed the limitations of evaluation and management by telemedicine and the availability of in person appointments. The patient expressed understanding and agreed to proceed.   HPI: 43 y/o WM being seen today for f/u adult ADD, GAD, hx of MDD. Was laid off 11/2018.  Has some apprehension and some intermittent poor mood due to this, as appropriate.  He denies any prolonged depressed mood. Anxiety is up, but he feels like this is normal given his situation.  Other than this he feels well.  Pt states all is going well with the med at current dosing: much improved focus, concentration, task completion.  Less frustration, better multitasking, less impulsivity and restlessness.  Mood is stable. No side effects from the medication.  He has hypogonadism and last visit we rechecked testost levels and they were low again (he had not been on testost at that time).  He has hx of not responding at all to topical testost preparations so we decided to rx testosterone injections.  However, he says his testosterone did not get approved and he never heard anything back from his insurer about why.  We did not get a prior auth.  ROS: no CP, no SOB, no wheezing, no cough, no dizziness, no HAs, no rashes, no melena/hematochezia.  No polyuria or polydipsia.  No myalgias or arthralgias.   Past Medical History:  Diagnosis Date  . Acute pancreatitis 05/17/2016   Admitted to hosp while out of town in [place].  Likely secondary to ETOH.  GB/stone w/u NEG.  . Allergy to cats    takes claritin  . Asthma    childhood  . Depression    wellbutrin helped in the past; pt cites failure to  respond to prozac, zoloft, and celexa  . Fear of flying    +insomnia; was on xanax regulary for long period and then abruptly stopped it and had w/drawal seizures.  . Hypogonadism male 2012 and 2016   Managed by Dr. Loanne Drilling ([facility]) as of 04/2017, but this was only brief.  I restarted pt on testos IM at end of Oct 2019.  . 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 Era Bumpers)  . Obesity, morbid, BMI 40.0-49.9 ([place])   . Palpitations    24h Holter.: sinus tachycardia  . Prediabetes 01/2017   A1c 6.3% ; 6.1% Dec 2018.  Marland Kitchen Syncope due to orthostatic hypotension    d/c'd from [employer] due to this.  Says if Wt 235 lbs or more then this is not a problem    No past surgical history on file.  Family History  Problem Relation Age of Onset  . Depression Mother        bipolar disorder  . Parkinsonism Mother   . Diabetes Father   . Other Father        low testosterone    SOCIAL HX: Married, no children.  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   Current Outpatient Medications:  .  ALPRAZolam (XANAX) 1 MG tablet, TAKE 1 TO 2 TABLETS BY MOUTH EVERY 8 HOURS AS NEEDED FOR ANXIETY, Disp: 90 tablet, Rfl: 5 .  amphetamine-dextroamphetamine (ADDERALL XR) 30 MG 24 hr capsule,  Take 1 capsule (30 mg total) by mouth every morning., Disp: 90 capsule, Rfl: 0 .  amphetamine-dextroamphetamine (ADDERALL) 10 MG tablet, 1-2 tabs po q afternoon, Disp: 180 tablet, Rfl: 0 .  DULoxetine (CYMBALTA) 60 MG capsule, TAKE ONE CAPSULE BY MOUTH DAILY, Disp: 30 capsule, Rfl: 0 .  levocetirizine (XYZAL) 5 MG tablet, Take 5 mg by mouth 2 (two) times daily., Disp: , Rfl:  .  NEEDLE, DISP, 21 G 21G X 1-1/2" MISC, Use to injection testosterone IM (Patient not taking: Reported on 03/11/2019), Disp: 100 each, Rfl: 0 .  PROAIR HFA 108 (90 Base) MCG/ACT inhaler, Inhale 1-2 puffs into the lungs every 4 (four) hours as needed for wheezing or shortness of breath. (Patient not taking: Reported on  03/11/2019), Disp: 1 Inhaler, Rfl: 0 .  Syringe/Needle, Disp, (SYRINGE 3CC/18GX1-1/2") 18G X 1-1/2" 3 ML MISC, Use to draw up testosterone (Patient not taking: Reported on 03/11/2019), Disp: 100 each, Rfl: 0 .  testosterone cypionate (DEPOTESTOSTERONE CYPIONATE) 200 MG/ML injection, 1/2 ml IM every 7 days (Patient not taking: Reported on 03/11/2019), Disp: 10 mL, Rfl: 0  EXAM:  VITALS per patient if applicable: Temp 17.4 F (37 C) (Oral)    GENERAL: alert, oriented, appears well and in no acute distress  HEENT: atraumatic, conjunttiva clear, no obvious abnormalities on inspection of external nose and ears  NECK: normal movements of the head and neck  LUNGS: on inspection no signs of respiratory distress, breathing rate appears normal, no obvious gross SOB, gasping or wheezing  CV: no obvious cyanosis  MS: moves all visible extremities without noticeable abnormality  PSYCH/NEURO: pleasant and cooperative, no obvious depression or anxiety, speech and thought processing grossly intact  ASSESSMENT AND PLAN:  Discussed the following assessment and plan:  1) Adult ADD: The current medical regimen is effective;  continue present plan and medications. I did electronic rx's for adderall xr 30mg  1 qAM, #90 and adderall 10mg , 1-2 q afternoon, #180 (both of these are 90d supply) today.   He'll come by our lab to get UDS and sign an updated CSC.  2) GAD, hx of MDD: stable despite losing his job 11/2018 and not many prospects going at this time. No med changes.  Duloxetine RF today, but he did not need xanax at this time.  3) Hypogon: problem with insurance regarding approval of testost injections so he never started this. Will rx this med again and see what we need to do to get it approved.    I discussed the assessment and treatment plan with the patient. The patient was provided an opportunity to ask questions and all were answered. The patient agreed with the plan and demonstrated an  understanding of the instructions.   The patient was advised to call back or seek an in-person evaluation if the symptoms worsen or if the condition fails to improve as anticipated.  F/u: 6 mo CPE  Signed:  Crissie Sickles, MD           03/11/2019

## 2019-03-23 ENCOUNTER — Other Ambulatory Visit: Payer: Self-pay | Admitting: Family Medicine

## 2019-03-23 NOTE — Telephone Encounter (Signed)
Contacted patient, his RX's were all send during his VV 03/11/2019.  Confirmed w/ pharmacy that RX's were available.  [facility] sent.

## 2019-03-23 NOTE — Telephone Encounter (Signed)
Copied from [place] [id number]. Topic: Quick Communication - Rx Refill/Question >> Mar 23, 2019 12:20 PM Pauline Good wrote: Medication:  Adderall 30mg  Adderall 10mg     Has the patient contacted their pharmacy? no (Agent: If no, request that the patient contact the pharmacy for the refill.) new pharmacy for pt (Agent: If yes, when and what did the pharmacy advise?)  Preferred Pharmacy (with phone number or street name): [facility] [facility] [phone]  Agent: Please be advised that RX refills may take up to 3 business days. We ask that you follow-up with your pharmacy.

## 2019-03-27 ENCOUNTER — Encounter: Payer: Self-pay | Admitting: Family Medicine

## 2019-03-27 ENCOUNTER — Telehealth: Payer: Self-pay

## 2019-03-27 ENCOUNTER — Ambulatory Visit (INDEPENDENT_AMBULATORY_CARE_PROVIDER_SITE_OTHER)
Admission: RE | Admit: 2019-03-27 | Discharge: 2019-03-27 | Disposition: A | Discharge status: Home / Self Care | Payer: BC Managed Care – PPO | Source: Ambulatory Visit

## 2019-03-27 DIAGNOSIS — M545 Low back pain, unspecified: Secondary | ICD-10-CM

## 2019-03-27 MED ORDER — TRAMADOL HCL 50 MG PO TABS: 50.0000 mg | ORAL_TABLET | Freq: Two times a day (BID) | ORAL | 0 refills | Status: AC | PRN

## 2019-03-27 MED ORDER — CYCLOBENZAPRINE HCL 10 MG PO TABS: 10.0000 mg | ORAL_TABLET | Freq: Two times a day (BID) | ORAL | 0 refills | Status: DC | PRN

## 2019-03-27 MED ORDER — PREDNISONE 10 MG (21) PO TBPK: ORAL_TABLET | ORAL | 0 refills | Status: DC

## 2019-03-27 NOTE — Telephone Encounter (Signed)
FYI: Patient sent a Mychart message earlier this morning stating that he only received a 57mL vial from [facility] for testosterone. Contacted pharmacy and found out his insurance will most likely not cover him getting more than 32mL at a time.

## 2019-03-27 NOTE — ED Provider Notes (Signed)
Virtual Visit via Video Note:  KINAN SAFLEY  initiated request for Telemedicine visit with [facility] team. I connected with [name]  on 03/27/2019 at 9:48 AM  for a synchronized telemedicine visit using a video enabled HIPPA compliant telemedicine application. I verified that I am speaking with [name]  using two identifiers. Orvan July, NP  was physically located in a [facility] site and GARCIA DALZELL was located at a different location.   The limitations of evaluation and management by telemedicine as well as the availability of in-person appointments were discussed. Patient was informed that he  may incur a bill ( including co-pay) for this virtual visit encounter. [name]  expressed understanding and gave verbal consent to proceed with virtual visit.     History of Present Illness:Philip Daugherty  is a 43 y.o. male presents with lower back pain.  This started yesterday when bending over to staple something.  He has been doing a remodeling on  his bathroom.No specific heavy lifting or injuries.   He felt a sharp pain and felt like something pulled and released.  He was unable to move the rest of the day after this incidence occurred.  This morning he is somewhat improved but still having a lot of pain.  He has been taking ibuprofen and Aleve without any relief.  Reports the tenderness is located mid spine in the lower lumbar area.  Denies any associated numbness or tingling.  Denies any loss of bowel or bladder function.  No history of back issues or surgeries.  Past Medical History:  Diagnosis Date  . Acute pancreatitis 05/17/2016   Admitted to hosp while out of town in [place].  Likely secondary to ETOH.  GB/stone w/u NEG.  . Allergy to cats    takes claritin  . Asthma    childhood  . Depression    wellbutrin helped in the past; pt cites failure to respond to prozac, zoloft, and celexa  . Fear of flying    +insomnia; was on  xanax regulary for long period and then abruptly stopped it and had w/drawal seizures.  . Hypogonadism male 2012 and 2016   Managed by Dr. Loanne Drilling ([facility]) as of 04/2017, but this was only brief.  I restarted pt on testos IM at end of Oct 2019.  . 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 Era Bumpers)  . Obesity, morbid, BMI 40.0-49.9 ([place])   . Palpitations    24h Holter.: sinus tachycardia  . Prediabetes 01/2017   A1c 6.3% ; 6.1% Dec 2018.  Marland Kitchen Syncope due to orthostatic hypotension    d/c'd from [employer] due to this.  Says if Wt 235 lbs or more then this is not a problem    No Known Allergies      Observations/Objective:GENERAL APPEARANCE: Well developed, well nourished, alert and cooperative, and appears to be in no acute distress. HEAD: normocephalic. Non labored breathing, no dyspnea or distress Skin: Skin normal color  PSYCHIATRIC: The mental examination revealed the patient was oriented to person, place, and time. The patient was able to demonstrate good judgement and reason, without hallucinations, abnormal affect or abnormal behaviors during the examination. Patient is not suicidal     Assessment and Plan:  Patient with lower back pain.  Most likely pulled muscle, muscle spasm or disc issue. We will do prednisone taper and muscle relaxant. Tramadol for more severe pain.  Ice for 24 to 48 hours and then heat and stretching  Instructed that if symptoms continue or worsen over the next week he will need to be seen in person for imaging   Follow Up Instructions: Follow up as needed for continued or worsening symptoms     I discussed the assessment and treatment plan with the patient. The patient was provided an opportunity to ask questions and all were answered. The patient agreed with the plan and demonstrated an understanding of the instructions.   The patient was advised to call back or seek an in-person evaluation if the symptoms  worsen or if the condition fails to improve as anticipated.     Orvan July, NP  03/27/2019 9:48 AM         Orvan July, NP 03/27/19 [phone]

## 2019-03-27 NOTE — Discharge Instructions (Signed)
This could be a pulled muscle or disc issue.  Ice to the are over the next 24 to 48 hours. Then you can start to  use heat and stretch.  Prednisone for pain and inflammation.  Muscle relaxant as needed for muscle spasm.  Tramadol for more severe pain.  If your symptoms continue or worsen after a week you will need to be seen in person for imaging.

## 2019-03-30 MED ORDER — TESTOSTERONE CYPIONATE 200 MG/ML IM SOLN: INTRAMUSCULAR | 0 refills | Status: DC

## 2019-03-30 NOTE — Telephone Encounter (Signed)
MyChart message sent to advise patient of new RX at pharmacy.

## 2019-03-30 NOTE — Telephone Encounter (Signed)
OK, 2 ml portion of testost inj sol'n eRx'd.

## 2019-06-29 ENCOUNTER — Encounter: Payer: Self-pay | Admitting: Emergency Medicine

## 2019-06-29 ENCOUNTER — Other Ambulatory Visit: Payer: Self-pay

## 2019-06-29 ENCOUNTER — Ambulatory Visit
Admission: EM | Admit: 2019-06-29 | Discharge: 2019-06-29 | Disposition: A | Discharge status: Home / Self Care | Payer: 59 | Attending: Physician Assistant | Admitting: Physician Assistant

## 2019-06-29 DIAGNOSIS — R05 Cough: Secondary | ICD-10-CM | POA: Diagnosis not present

## 2019-06-29 DIAGNOSIS — J029 Acute pharyngitis, unspecified: Secondary | ICD-10-CM | POA: Diagnosis not present

## 2019-06-29 DIAGNOSIS — Z20822 Contact with and (suspected) exposure to covid-19: Secondary | ICD-10-CM

## 2019-06-29 DIAGNOSIS — R5383 Other fatigue: Secondary | ICD-10-CM

## 2019-06-29 DIAGNOSIS — R059 Cough, unspecified: Secondary | ICD-10-CM

## 2019-06-29 DIAGNOSIS — R6889 Other general symptoms and signs: Secondary | ICD-10-CM | POA: Diagnosis not present

## 2019-06-29 DIAGNOSIS — M791 Myalgia, unspecified site: Secondary | ICD-10-CM

## 2019-06-29 LAB — POCT RAPID STREP A (OFFICE): Rapid Strep A Screen: NEGATIVE

## 2019-06-29 MED ORDER — PREDNISONE 50 MG PO TABS: 50.0000 mg | ORAL_TABLET | Freq: Every day | ORAL | 0 refills | Status: DC
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
## 2019-06-29 NOTE — Discharge Instructions (Addendum)
Rapid strep negative. As discussed, cannot rule out COVID. Currently, no alarming signs. Testing ordered. I would like you to quarantine until testing results. Start prednisone for cough. Albuterol inhaler as needed for shortness of breath. You can use over the counter flonase/nasacort for nasal congestion/drainage. Keep hydrated, urine should be clear to pale yellow in color. If experiencing shortness of breath, trouble breathing, chest pain go to the ED for further evaluation needed. If experiencing dizziness, passing out, confusion, go to the ED for further evaluation needed.
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
## 2019-06-29 NOTE — ED Notes (Signed)
kap

## 2019-06-29 NOTE — ED Notes (Signed)
Patient able to ambulate independently  

## 2019-06-30 ENCOUNTER — Telehealth: Payer: Self-pay | Admitting: Emergency Medicine

## 2019-06-30 LAB — NOVEL CORONAVIRUS, NAA: SARS-CoV-2, NAA: NOT DETECTED

## 2019-06-30 NOTE — Telephone Encounter (Signed)
Checked in on patient, discussed medications, and encouraged return call with any continuing questions or concerns.    

## 2019-07-01 ENCOUNTER — Encounter (HOSPITAL_COMMUNITY): Payer: Self-pay

## 2019-07-02 LAB — CULTURE, GROUP A STREP (THRC)

## 2019-07-06 ENCOUNTER — Other Ambulatory Visit: Payer: Self-pay

## 2019-07-06 ENCOUNTER — Other Ambulatory Visit: Payer: Self-pay | Admitting: Family Medicine

## 2019-07-06 NOTE — Telephone Encounter (Signed)
Patient refill request amphetamine-dextroamphetamine (ADDERALL XR) 30 MG 24 hr capsule [id number]   amphetamine-dextroamphetamine (ADDERALL) 10 MG tablet [id number]   Please remove [facility] [address] as he no longer uses this pharmacy  Please send all prescriptions to [facility]

## 2019-07-06 NOTE — Telephone Encounter (Signed)
RF request for Adderall 10mg /30mg  LOV: 03/11/19 Next ov: advised to f/u 6 mo. Last written: 03/11/19 (180,0)/ (90,0) Last CSC: 03/11/19 w/ UDS: 01/02/18  Please advise, thanks. Medications pending. Pharmacy updated in pt's chart.

## 2019-07-07 MED ORDER — AMPHETAMINE-DEXTROAMPHETAMINE 10 MG PO TABS: ORAL_TABLET | ORAL | 0 refills | Status: DC

## 2019-07-07 MED ORDER — AMPHETAMINE-DEXTROAMPHET ER 30 MG PO CP24: 30.0000 mg | ORAL_CAPSULE | ORAL | 0 refills | Status: DC

## 2019-07-23 ENCOUNTER — Ambulatory Visit (INDEPENDENT_AMBULATORY_CARE_PROVIDER_SITE_OTHER): Payer: 59 | Admitting: Family Medicine

## 2019-07-23 ENCOUNTER — Encounter: Payer: Self-pay | Admitting: Family Medicine

## 2019-07-23 ENCOUNTER — Other Ambulatory Visit: Payer: Self-pay

## 2019-07-23 VITALS — Ht 72.25 in | Wt 319.0 lb

## 2019-07-23 DIAGNOSIS — F322 Major depressive disorder, single episode, severe without psychotic features: Secondary | ICD-10-CM

## 2019-07-23 DIAGNOSIS — Z7689 Persons encountering health services in other specified circumstances: Secondary | ICD-10-CM

## 2019-07-23 DIAGNOSIS — F10288 Alcohol dependence with other alcohol-induced disorder: Secondary | ICD-10-CM

## 2019-07-23 DIAGNOSIS — R45851 Suicidal ideations: Secondary | ICD-10-CM | POA: Diagnosis not present

## 2019-07-23 DIAGNOSIS — F5104 Psychophysiologic insomnia: Secondary | ICD-10-CM

## 2019-07-23 DIAGNOSIS — F988 Other specified behavioral and emotional disorders with onset usually occurring in childhood and adolescence: Secondary | ICD-10-CM

## 2019-07-23 DIAGNOSIS — F411 Generalized anxiety disorder: Secondary | ICD-10-CM

## 2019-07-23 DIAGNOSIS — Z23 Encounter for immunization: Secondary | ICD-10-CM | POA: Diagnosis not present

## 2019-07-23 NOTE — Patient Instructions (Addendum)
Suicidal Feelings: How to Help Yourself Suicide is when you end your own life. There are many things you can do to help yourself feel better when struggling with these feelings. Many services and people are available to support you and others who struggle with similar feelings.  If you ever feel like you may hurt yourself or others, or have thoughts about taking your own life, get help right away. To get help:  Call your local emergency services (911 in the [place]  [facility] [place] [facility] health and human services helpline (211 in the [place]  Go to your nearest emergency department.  Call a suicide hotline to speak with a trained counselor. The following suicide hotlines are available in the [place]: ? 1-800-273-TALK 336 861 3887). ? 1-800-SUICIDE 765-446-6728). ? (782)088-1123. This is a hotline for Spanish speakers. ? (202) 468-0162. This is a hotline for TTY users. ? 1-866-4-U-TREVOR 346-829-7216). This is a hotline for lesbian, gay, bisexual, transgender, or questioning youth. ? For a list of hotlines in [place], visit ParkingAffiliatePrograms.se.html  Contact a crisis center or a local suicide prevention center. To find a crisis center or suicide prevention center: ? Call your local hospital, clinic, community service organization, mental health center, social service provider, or health department. Ask for help with connecting to a crisis center. ? For a list of crisis centers in the [place], visit: suicidepreventionlifeline.org ? For a list of crisis centers in [place], visit: suicideprevention.ca How to help yourself feel better   Promise yourself that you will not do anything extreme when you have suicidal feelings. Remember, there is hope. Many people have gotten through suicidal thoughts and feelings, and you can too. If you have had these feelings before, remind yourself that you can get through them again.  Let family,  friends, teachers, or counselors know how you are feeling. Try not to separate yourself from those who care about you and want to help you. Talk with someone every day, even if you do not feel sociable. Face-to-face conversation is best to help them understand your feelings.  Contact a mental health care provider and work with this person regularly.  Make a safety plan that you can follow during a crisis. Include phone numbers of suicide prevention hotlines, mental health professionals, and trusted friends and family members you can call during an emergency. Save these numbers on your phone.  If you are thinking of taking a lot of medicine, give your medicine to someone who can give it to you as prescribed. If you are on antidepressants and are concerned you will overdose, tell your health care provider so that he or she can give you safer medicines.  Try to stick to your routines. Follow a schedule every day. Make self-care a priority.  Make a list of realistic goals, and cross them off when you achieve them. Accomplishments can give you a sense of worth.  Wait until you are feeling better before doing things that you find difficult or unpleasant.  Do things that you have always enjoyed to take your mind off your feelings. Try reading a book, or listening to or playing music. Spending time outside, in nature, may help you feel better. Follow these instructions at home:   Visit your primary health care provider every year for a checkup.  Work with a mental health care provider as needed.  Eat a well-balanced diet, and eat regular meals.  Get plenty of rest.  Exercise if you are able. Just 30 minutes of exercise each day  can help you feel better.  Take over-the-counter and prescription medicines only as told by your health care provider. Ask your mental health care provider about the possible side effects of any medicines you are taking.  Do not use alcohol or drugs, and remove these  substances from your home.  Remove weapons, poisons, knives, and other deadly items from your home. General recommendations  Keep your living space well lit.  When you are feeling well, write yourself a letter with tips and support that you can read when you are not feeling well.  Remember that life's difficulties can be sorted out with help. Conditions can be treated, and you can learn behaviors and ways of thinking that will help you. Where to find more information  National Suicide Prevention Lifeline: www.suicidepreventionlifeline.org  Hopeline: www.hopeline.[place] for Suicide Prevention: PromotionalLoans.co.za  [employer] (for lesbian, gay, bisexual, transgender, or questioning youth): www.thetrevorproject.org Contact a health care provider if:  You feel as though you are a burden to others.  You feel agitated, angry, vengeful, or have extreme mood swings.  You have withdrawn from family and friends. Get help right away if:  You are talking about suicide or wishing to die.  You start making plans for how to commit suicide.  You feel that you have no reason to live.  You start making plans for putting your affairs in order, saying goodbye, or giving your possessions away.  You feel guilt, shame, or unbearable pain, and it seems like there is no way out.  You are frequently using drugs or alcohol.  You are engaging in risky behaviors that could lead to death. If you have any of these symptoms, get help right away. Call emergency services, go to your nearest emergency department or crisis center, or call a suicide crisis helpline. Summary  Suicide is when you take your own life.  Promise yourself that you will not do anything extreme when you have suicidal feelings.  Let family, friends, teachers, or counselors know how you are feeling.  Get help right away if you feel as though life is getting too tough to handle and you are thinking about  suicide. This information is not intended to replace advice given to you by your health care provider. Make sure you discuss any questions you have with your health care provider. Document Released: 04/07/2003 Document Revised: 01/22/2019 Document Reviewed: 05/14/2017 Elsevier Patient Education  [place].    -Also we need to transition your brain into thinking more positively.  These tasks below are some things I want you to do every day 1)  write 3 new things that you are grateful for every day for 21 days  2)  exercise daily- walk for 15 minutes twice a day every day 3)  you are going to journal every day about one positive experience that you had 4)  meditate every day.  You can go on YouTube and look for 15-minute relaxation meditation or what ever.  But we need to make sure that you are in the moment and relaxing and deep breathing every day 5)  Write 1 positive email every day to praise someone in your life     - If you have insomnia or difficulty sleeping, this information is for you:  - Avoid caffeinated beverages after lunch,  no alcoholic beverages,  no eating within 2-3 hours of lying down,  avoid exposure to blue light before bed,  avoid daytime naps, and  needs to  maintain a regular sleep schedule- go to sleep and wake up around the same time every night.   - Resolve concerns or worries before entering bedroom:  Discussed relaxation techniques with patient and to keep a journal to write down fears\ worries.  I suggested seeing a counselor for CBT.   - Recommend patient meditate or do deep breathing exercises to help relax.   Incorporate the use of white noise machines or listen to "sleep meditation music", or recordings of guided meditations for sleep from YouTube which are free, such as  "guided meditation for detachment from over thinking"  by Mayford Knife.        Generalized Anxiety Disorder, Adult  Generalized anxiety disorder (GAD) is a mental health  disorder. People with this condition constantly worry about everyday events. Unlike normal anxiety, worry related to GAD is not triggered by a specific event. These worries also do not fade or get better with time. GAD interferes with life functions, including relationships, work, and school. GAD can vary from mild to severe. People with severe GAD can have intense waves of anxiety with physical symptoms (panic attacks). What are the causes? The exact cause of GAD is not known. What increases the risk? This condition is more likely to develop in:  Women.  People who have a family history of anxiety disorders.  People who are very shy.  People who experience very stressful life events, such as the death of a loved one.  People who have a very stressful family environment.  What are the signs or symptoms? People with GAD often worry excessively about many things in their lives, such as their health and family. They may also be overly concerned about:  Doing well at work.  Being on time.  Natural disasters.  Friendships.  Physical symptoms of GAD include:  Fatigue.  Muscle tension or having muscle twitches.  Trembling or feeling shaky.  Being easily startled.  Feeling like your heart is pounding or racing.  Feeling out of breath or like you cannot take a deep breath.  Having trouble falling asleep or staying asleep.  Sweating.  Nausea, diarrhea, or irritable bowel syndrome (IBS).  Headaches.  Trouble concentrating or remembering facts.  Restlessness.  Irritability.  How is this diagnosed? Your health care provider can diagnose GAD based on your symptoms and medical history. You will also have a physical exam. The health care provider will ask specific questions about your symptoms, including how severe they are, when they started, and if they come and go. Your health care provider may ask you about your use of alcohol or drugs, including prescription medicines.  Your health care provider may refer you to a mental health specialist for further evaluation. Your health care provider will do a thorough examination and may perform additional tests to rule out other possible causes of your symptoms. To be diagnosed with GAD, a person must have anxiety that:  Is out of his or her control.  Affects several different aspects of his or her life, such as work and relationships.  Causes distress that makes him or her unable to take part in normal activities.  Includes at least three physical symptoms of GAD, such as restlessness, fatigue, trouble concentrating, irritability, muscle tension, or sleep problems.  Before your health care provider can confirm a diagnosis of GAD, these symptoms must be present more days than they are not, and they must last for six months or longer. How is this treated? The following therapies are  usually used to treat GAD:  Medicine. Antidepressant medicine is usually prescribed for long-term daily control. Antianxiety medicines may be added in severe cases, especially when panic attacks occur.  Talk therapy (psychotherapy). Certain types of talk therapy can be helpful in treating GAD by providing support, education, and guidance. Options include: ? Cognitive behavioral therapy (CBT). People learn coping skills and techniques to ease their anxiety. They learn to identify unrealistic or negative thoughts and behaviors and to replace them with positive ones. ? Acceptance and commitment therapy (ACT). This treatment teaches people how to be mindful as a way to cope with unwanted thoughts and feelings. ? Biofeedback. This process trains you to manage your body's response (physiological response) through breathing techniques and relaxation methods. You will work with a therapist while machines are used to monitor your physical symptoms.  Stress management techniques. These include yoga, meditation, and exercise.  A mental health  specialist can help determine which treatment is best for you. Some people see improvement with one type of therapy. However, other people require a combination of therapies. Follow these instructions at home:  Take over-the-counter and prescription medicines only as told by your health care provider.  Try to maintain a normal routine.  Try to anticipate stressful situations and allow extra time to manage them.  Practice any stress management or self-calming techniques as taught by your health care provider.  Do not punish yourself for setbacks or for not making progress.  Try to recognize your accomplishments, even if they are small.  Keep all follow-up visits as told by your health care provider. This is important. Contact a health care provider if:  Your symptoms do not get better.  Your symptoms get worse.  You have signs of depression, such as: ? A persistently sad, cranky, or irritable mood. ? Loss of enjoyment in activities that used to bring you joy. ? Change in weight or eating. ? Changes in sleeping habits. ? Avoiding friends or family members. ? Loss of energy for normal tasks. ? Feelings of guilt or worthlessness. Get help right away if:  You have serious thoughts about hurting yourself or others. If you ever feel like you may hurt yourself or others, or have thoughts about taking your own life, get help right away. You can go to your nearest emergency department or call:  Your local emergency services (911 in the [place]  A suicide crisis helpline, such as the [place] at (787)123-6269. This is open 24 hours a day.  Summary  Generalized anxiety disorder (GAD) is a mental health disorder that involves worry that is not triggered by a specific event.  People with GAD often worry excessively about many things in their lives, such as their health and family.  GAD may cause physical symptoms such as restlessness, trouble concentrating,  sleep problems, frequent sweating, nausea, diarrhea, headaches, and trembling or muscle twitching.  A mental health specialist can help determine which treatment is best for you. Some people see improvement with one type of therapy. However, other people require a combination of therapies. This information is not intended to replace advice given to you by your health care provider. Make sure you discuss any questions you have with your health care provider. Document Released: 01/26/2013 Document Revised: 08/21/2016 Document Reviewed: 08/21/2016 Elsevier Interactive Patient Education  2018 [place]  After being diagnosed with an anxiety disorder, you may be relieved to know why you have felt or  behaved a certain way. It is natural to also feel overwhelmed about the treatment ahead and what it will mean for your life. With care and support, you can manage this condition and recover from it. How to cope with anxiety Dealing with stress Stress is your bodys reaction to life changes and events, both good and bad. Stress can last just a few hours or it can be ongoing. Stress can play a major role in anxiety, so it is important to learn both how to cope with stress and how to think about it differently. Talk with your health care provider or a counselor to learn more about stress reduction. He or she may suggest some stress reduction techniques, such as:  Music therapy. This can include creating or listening to music that you enjoy and that inspires you.  Mindfulness-based meditation. This involves being aware of your normal breaths, rather than trying to control your breathing. It can be done while sitting or walking.  Centering prayer. This is a kind of meditation that involves focusing on a word, phrase, or sacred image that is meaningful to you and that brings you peace.  Deep breathing. To do this, expand your stomach and inhale slowly through your nose. Hold  your breath for 3-5 seconds. Then exhale slowly, allowing your stomach muscles to relax.  Self-talk. This is a skill where you identify thought patterns that lead to anxiety reactions and correct those thoughts.  Muscle relaxation. This involves tensing muscles then relaxing them.  Choose a stress reduction technique that fits your lifestyle and personality. Stress reduction techniques take time and practice. Set aside 5-15 minutes a day to do them. Therapists can offer training in these techniques. The training may be covered by some insurance plans. Other things you can do to manage stress include:  Keeping a stress diary. This can help you learn what triggers your stress and ways to control your response.  Thinking about how you respond to certain situations. You may not be able to control everything, but you can control your reaction.  Making time for activities that help you relax, and not feeling guilty about spending your time in this way.  Therapy combined with coping and stress-reduction skills provides the best chance for successful treatment. Medicines Medicines can help ease symptoms. Medicines for anxiety include:  Anti-anxiety drugs.  Antidepressants.  Beta-blockers.  Medicines may be used as the main treatment for anxiety disorder, along with therapy, or if other treatments are not working. Medicines should be prescribed by a health care provider. Relationships Relationships can play a big part in helping you recover. Try to spend more time connecting with trusted friends and family members. Consider going to couples counseling, taking family education classes, or going to family therapy. Therapy can help you and others better understand the condition. How to recognize changes in your condition Everyone has a different response to treatment for anxiety. Recovery from anxiety happens when symptoms decrease and stop interfering with your daily activities at home or work. This  may mean that you will start to:  Have better concentration and focus.  Sleep better.  Be less irritable.  Have more energy.  Have improved memory.  It is important to recognize when your condition is getting worse. Contact your health care provider if your symptoms interfere with home or work and you do not feel like your condition is improving. Where to find help and support: You can get help and support from these sources:  Self-help groups.  Online and [employer].  A trusted spiritual leader.  Couples counseling.  Family education classes.  Family therapy.  Follow these instructions at home:  Eat a healthy diet that includes plenty of vegetables, fruits, whole grains, low-fat dairy products, and lean protein. Do not eat a lot of foods that are high in solid fats, added sugars, or salt.  Exercise. Most adults should do the following: ? Exercise for at least 150 minutes each week. The exercise should increase your heart rate and make you sweat (moderate-intensity exercise). ? Strengthening exercises at least twice a week.  Cut down on caffeine, tobacco, alcohol, and other potentially harmful substances.  Get the right amount and quality of sleep. Most adults need 7-9 hours of sleep each night.  Make choices that simplify your life.  Take over-the-counter and prescription medicines only as told by your health care provider.  Avoid caffeine, alcohol, and certain over-the-counter cold medicines. These may make you feel worse. Ask your pharmacist which medicines to avoid.  Keep all follow-up visits as told by your health care provider. This is important. Questions to ask your health care provider  Would I benefit from therapy?  How often should I follow up with a health care provider?  How long do I need to take medicine?  Are there any long-term side effects of my medicine?  Are there any alternatives to taking medicine? Contact a health care  provider if:  You have a hard time staying focused or finishing daily tasks.  You spend many hours a day feeling worried about everyday life.  You become exhausted by worry.  You start to have headaches, feel tense, or have nausea.  You urinate more than normal.  You have diarrhea. Get help right away if:  You have a racing heart and shortness of breath.  You have thoughts of hurting yourself or others. If you ever feel like you may hurt yourself or others, or have thoughts about taking your own life, get help right away. You can go to your nearest emergency department or call:  Your local emergency services (911 in the [place]  A suicide crisis helpline, such as the [place] at 6181673061. This is open 24-hours a day.  Summary  Taking steps to deal with stress can help calm you.  Medicines cannot cure anxiety disorders, but they can help ease symptoms.  Family, friends, and partners can play a big part in helping you recover from an anxiety disorder. This information is not intended to replace advice given to you by your health care provider. Make sure you discuss any questions you have with your health care provider. Document Released: 09/25/2016 Document Revised: 09/25/2016 Document Reviewed: 09/25/2016 Elsevier Interactive Patient Education  2018 [place] Suicide Prevention Lifeline We can all help prevent suicide. The Lifeline provides 24/7, free and confidential support for people in distress, prevention and crisis resources for you or your loved ones, and best practices for professionals.  [phone]
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
## 2019-07-25 DIAGNOSIS — R45851 Suicidal ideations: Secondary | ICD-10-CM | POA: Insufficient documentation

## 2019-07-25 DIAGNOSIS — F411 Generalized anxiety disorder: Secondary | ICD-10-CM | POA: Insufficient documentation

## 2019-07-25 DIAGNOSIS — F10288 Alcohol dependence with other alcohol-induced disorder: Secondary | ICD-10-CM | POA: Insufficient documentation

## 2019-07-25 DIAGNOSIS — F5104 Psychophysiologic insomnia: Secondary | ICD-10-CM | POA: Insufficient documentation

## 2019-07-25 DIAGNOSIS — F322 Major depressive disorder, single episode, severe without psychotic features: Secondary | ICD-10-CM | POA: Insufficient documentation

## 2019-07-25 DIAGNOSIS — F988 Other specified behavioral and emotional disorders with onset usually occurring in childhood and adolescence: Secondary | ICD-10-CM | POA: Insufficient documentation

## 2019-07-25 HISTORY — DX: Suicidal ideations: R45.851

## 2019-08-03 ENCOUNTER — Telehealth: Payer: Self-pay | Admitting: Family Medicine

## 2019-08-03 NOTE — Telephone Encounter (Signed)
Patient states he still hasn't heard anything from [facility]

## 2019-08-12 ENCOUNTER — Encounter: Payer: Self-pay | Admitting: Family Medicine

## 2019-08-17 ENCOUNTER — Encounter (HOSPITAL_COMMUNITY): Payer: Self-pay | Admitting: Psychiatry

## 2019-08-17 ENCOUNTER — Ambulatory Visit (INDEPENDENT_AMBULATORY_CARE_PROVIDER_SITE_OTHER): Payer: 59 | Admitting: Psychiatry

## 2019-08-17 ENCOUNTER — Telehealth: Payer: Self-pay | Admitting: Family Medicine

## 2019-08-17 ENCOUNTER — Other Ambulatory Visit: Payer: Self-pay

## 2019-08-17 VITALS — Ht 73.0 in | Wt 310.0 lb

## 2019-08-17 DIAGNOSIS — F331 Major depressive disorder, recurrent, moderate: Secondary | ICD-10-CM

## 2019-08-17 DIAGNOSIS — F322 Major depressive disorder, single episode, severe without psychotic features: Secondary | ICD-10-CM | POA: Diagnosis not present

## 2019-08-17 DIAGNOSIS — H52222 Regular astigmatism, left eye: Secondary | ICD-10-CM | POA: Insufficient documentation

## 2019-08-17 DIAGNOSIS — F988 Other specified behavioral and emotional disorders with onset usually occurring in childhood and adolescence: Secondary | ICD-10-CM | POA: Diagnosis not present

## 2019-08-17 DIAGNOSIS — F5104 Psychophysiologic insomnia: Secondary | ICD-10-CM | POA: Diagnosis not present

## 2019-08-17 DIAGNOSIS — F10288 Alcohol dependence with other alcohol-induced disorder: Secondary | ICD-10-CM

## 2019-08-17 DIAGNOSIS — F411 Generalized anxiety disorder: Secondary | ICD-10-CM | POA: Diagnosis not present

## 2019-08-17 DIAGNOSIS — H5213 Myopia, bilateral: Secondary | ICD-10-CM | POA: Insufficient documentation

## 2019-08-17 MED ORDER — BUPROPION HCL ER (XL) 150 MG PO TB24: 150.0000 mg | ORAL_TABLET | ORAL | 0 refills | Status: DC

## 2019-08-17 NOTE — Progress Notes (Signed)
Psychiatric Initial Adult Assessment   Patient Identification: Philip Daugherty MRN:  [id number] Date of Evaluation:  08/17/2019 Referral Source: primary care Chief Complaint:   Chief Complaint    Establish Care; Depression     Visit Diagnosis:    ICD-10-CM   1. Major depressive disorder, recurrent episode, moderate (HCC)  F33.1   2. Alcohol dependence with other alcohol-induced disorder ([place])  F10.288   3. Psychophysiological insomnia  F51.04   4. Depression, major, single episode, severe ([place])  F32.2   5. Attention deficit disorder (ADD) without hyperactivity  F98.8   6. GAD (generalized anxiety disorder)  F41.1    I connected with [name] on 08/17/19 at  2:00 PM EST by a video enabled telemedicine application and verified that I am speaking with the correct person using two identifiers.   I discussed the limitations of evaluation and management by telemedicine and the availability of in person appointments. The patient expressed understanding and agreed to proceed.   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  Referred by primary care physician for management of depression  He has noticed the last couple months of the depression has gotten worse feeling foggy inattentive at times difficulty completing chores decreased energy.  Has had difficult at work stress.  He feels he is working to restart this level but notices that he is not as sharp as he was before.  He does take Adderall for inattention for the last couple of years plus immediate release if needed during the day  He has gained weight last 2 months, poor sleep and snores.  She feels the fogginess and the withdrawn feeling of depression and sadness is affecting his life especially when he travels he does not like it and wants to be with kids , job stress has increased  Suicidal thoughts no suicidal plan but he has kids and supportive wife and did  not dwell on it  No psychotic symptoms or manic symptoms  Time overspending at [employer] but other than that no clear manic symptoms  He does worry worry about finances quit her job due to having a baby 10 months now He is reasonable for future about which medicine is he moving into  Aggravating factor: job stress, finances, travel Modifying factor: kids, wife  Duration more then 2 years but more so last 2 months  Alcohol use: 2-3 beer daily for last 20 years Marijuana use: sporadic but more regular when stressed  Has used xanax prn for flight fear. Does not take regularly     Past Psychiatric History: depression  Previous Psychotropic Medications: Yes  Has used wellbutrin in past which worked initially.  Substance Abuse History in the last 12 months:  Yes.    Consequences of Substance Abuse: fogginess, judjement challenge , depression  Past Medical History:  Past Medical History:  Diagnosis Date  . Acute pancreatitis 05/17/2016   Admitted to hosp while out of town in [place].  Likely secondary to ETOH.  GB/stone w/u NEG.  . Allergy to cats    takes claritin  . Asthma    childhood  . Depression    wellbutrin helped in the past; pt cites failure to respond to prozac, zoloft, and celexa  . Fear of flying    +insomnia; was on xanax regulary for long period and then abruptly stopped it and had w/drawal seizures.  . Hypogonadism male 2012 and 2016   Managed  by Dr. Loanne Drilling ([facility]) as of 04/2017, but this was only brief.  I restarted pt on testos IM at end of Oct 2019.  . 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 Era Bumpers)  . Obesity, morbid, BMI 40.0-49.9 ([place])   . Palpitations    24h Holter.: sinus tachycardia  . Prediabetes 01/2017   A1c 6.3% ; 6.1% Dec 2018.  Marland Kitchen Syncope due to orthostatic hypotension    d/c'd from [employer] due to this.  Says if Wt 235 lbs or more then this is not a problem   History reviewed. No pertinent  surgical history.  Family Psychiatric History: mom bipolar Depression in family   Family History:  Family History  Problem Relation Age of Onset  . Depression Mother        bipolar disorder  . Parkinsonism Mother   . Diabetes Father   . Other Father        low testosterone    Social History:   Social History   Socioeconomic History  . Marital status: Married    Spouse name: Not on file  . Number of children: Not on file  . Years of education: Not on file  . Highest education level: Not on file  Occupational History  . Not on file  Social Needs  . Financial resource strain: Not on file  . Food insecurity    Worry: Not on file    Inability: Not on file  . Transportation needs    Medical: Not on file    Non-medical: Not on file  Tobacco Use  . Smoking status: Never Smoker  . Smokeless tobacco: Never Used  Substance and Sexual Activity  . Alcohol use: Yes    Comment: has decreased since 01/04/12  . Drug use: No  . Sexual activity: Not on file  Lifestyle  . Physical activity    Days per week: Not on file    Minutes per session: Not on file  . Stress: Not on file  Relationships  . Social Herbalist on phone: Not on file    Gets together: Not on file    Attends religious service: Not on file    Active member of club or organization: Not on file    Attends meetings of clubs or organizations: Not on file    Relationship status: Not on file  Other Topics Concern  . Not on file  Social History Narrative   Married, second marriage.  No children.   Orig from [place], spent time in [place], college in [place].   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   No tobacco.  Alcohol: 3-4 bourbon drinks most days, cut back as of 12/2011.   Exercise: sporadic (elliptical, walking).  No hx of drug use/abuse.       Additional Social  History: grew up with parents and siblings.  His mom was verbally and emotionally abusive she had bipolar she was also hospitalized at times. She does not have a good realtionship with her  Married now and have 2 daughters  Allergies:  No Known Allergies  Metabolic Disorder Labs: Lab Results  Component Value Date   HGBA1C 6.1 10/01/2017   Lab Results  Component Value Date   PROLACTIN 5.9 07/24/2018   PROLACTIN 5.4 04/10/2017   Lab Results  Component Value Date   CHOL  166 05/23/2016   TRIG 149.0 05/23/2016   HDL 29.60 (L) 05/23/2016   CHOLHDL 6 05/23/2016   VLDL 29.8 05/23/2016   LDLCALC 107 (H) 05/23/2016   LDLCALC 129 (H) 10/05/2014   Lab Results  Component Value Date   TSH 2.43 07/24/2018    Therapeutic Level Labs: No results found for: LITHIUM No results found for: CBMZ No results found for: VALPROATE  Current Medications: Current Outpatient Medications  Medication Sig Dispense Refill  . ALPRAZolam (XANAX) 1 MG tablet TAKE 1 TO 2 TABLETS BY MOUTH EVERY 8 HOURS AS NEEDED FOR ANXIETY 90 tablet 5  . amphetamine-dextroamphetamine (ADDERALL XR) 30 MG 24 hr capsule Take 1 capsule (30 mg total) by mouth every morning. 90 capsule 0  . amphetamine-dextroamphetamine (ADDERALL) 10 MG tablet 1-2 tabs po q afternoon 180 tablet 0  . buPROPion (WELLBUTRIN XL) 150 MG 24 hr tablet Take 1 tablet (150 mg total) by mouth every morning. 30 tablet 0  . DULoxetine (CYMBALTA) 60 MG capsule Take 1 capsule (60 mg total) by mouth daily. 90 capsule 3  . levocetirizine (XYZAL) 5 MG tablet Take 5 mg by mouth 2 (two) times daily.    Marland Kitchen testosterone cypionate (DEPOTESTOSTERONE CYPIONATE) 200 MG/ML injection 1/2 ml IM every 7 days (Patient not taking: Reported on 07/23/2019) 2 mL 0   No current facility-administered medications for this visit.     Musculoskeletal:   Psychiatric Specialty Exam: Review of Systems  Cardiovascular: Negative for chest pain.  Skin: Negative for rash.   Psychiatric/Behavioral: Positive for depression and substance abuse. Negative for suicidal ideas. The patient is nervous/anxious.     Height 6\' 1"  (1.854 m), weight (!) 310 lb (140.6 kg).Body mass index is 40.9 kg/m.  General Appearance: Casual  Eye Contact:  Fair  Speech:  Slow  Volume:  Normal  Mood:  Dysphoric  Affect:  Congruent  Thought Process:  Goal Directed  Orientation:  Full (Time, Place, and Person)  Thought Content:  rumination  Suicidal Thoughts:  No  Homicidal Thoughts:  No  Memory:  Immediate;   Fair Recent;   Fair  Judgement:  Fair  Insight:  Shallow  Psychomotor Activity:  Decreased  Concentration:  Concentration: Fair and Attention Span: Fair  Recall:  [employer] [place]: Fair  Akathisia:  No  Handed:  Right  AIMS (if indicated):  not done  Assets:  Desire for Improvement Housing Social Support  ADL's:  Intact  Cognition: WNL  Sleep:  Fair   Screenings: GAD-7     Office Visit from 03/11/2019 in [facility]  Total GAD-7 Score  0    PHQ2-9     Office Visit from 07/23/2019 in [facility] at [facility] Visit from 03/11/2019 in [facility] Visit from 07/24/2018 in [facility] Visit from 10/01/2017 in [facility] Visit from 05/23/2017 in [facility]  PHQ-2 Total Score  5  0  0  0  0  PHQ-9 Total Score  19  1  4  5  2       Assessment and Plan: as follows  MDD recurrent severe: continue cymbalta, add wellbutrin 150mg  xr during the day. Continue therapy GADL continue cymbalta and therapy  Alcohol use: discussed to abstain so meds would work and also alcohol could be contributing to depression and fogginess along with marijuana, he  understands to quit, says will do on his own  ADHD: on adderall by pcp, discussed drug holidays Refer to sleep clinic for rule out sleep apnea contributing to  fogginess, inattention and tiredness  Denies suicidal plan or toughts as of now, discussed coping techniques and distraction   I discussed the assessment and treatment plan with the patient. The patient was provided an opportunity to ask questions and all were answered. The patient agreed with the plan and demonstrated an understanding of the instructions.   The patient was advised to call back or seek an in-person evaluation if the symptoms worsen or if the condition fails to improve as anticipated.  I provided 50  minutes of non-face-to-face time during this encounter.  Fu 3-4 weeks or earlier if needed     Merian Capron, MD 11/2/20202:46 PM

## 2019-08-17 NOTE — Telephone Encounter (Signed)
Patient went to [facility] appt based on PCP referral and they would like him to have a sleep study done and asked for [place] to facilitate this order. If appropriate please place referral in [facility] for [facility]

## 2019-08-17 NOTE — Telephone Encounter (Signed)
Please advise, AS, CMA

## 2019-08-18 NOTE — Telephone Encounter (Signed)
Patient is aware of the below and has apt for physical on 08/27/19 at 11am with [place].

## 2019-08-18 NOTE — Telephone Encounter (Signed)
Not sure why his psychiatrist did not refer him for further evaluation of possible OSA if they thought that was needed as, there just is capable as [place] to do so.   Thus, please tell patient I am sorry but he will have to have an office visit with me to discuss his possible OSA symptoms, since I have never discussed any of these symptoms with patient prior.  This can be in a live video visit if you have does not wish to come in person  Philip Daugherty or Philip Daugherty, please note that patient will need to questionnaires for this follow-up visit with [place]:  STOP BANG Questionnaire:  Epworth sleepiness scale

## 2019-08-27 ENCOUNTER — Encounter: Payer: 59 | Admitting: Family Medicine

## 2019-09-15 ENCOUNTER — Ambulatory Visit (HOSPITAL_COMMUNITY): Payer: 59 | Admitting: Psychiatry

## 2019-09-16 ENCOUNTER — Ambulatory Visit (HOSPITAL_COMMUNITY): Payer: 59 | Admitting: Psychiatry

## 2019-09-17 ENCOUNTER — Encounter (HOSPITAL_COMMUNITY): Payer: Self-pay | Admitting: Psychiatry

## 2019-09-17 ENCOUNTER — Ambulatory Visit (INDEPENDENT_AMBULATORY_CARE_PROVIDER_SITE_OTHER): Payer: 59 | Admitting: Psychiatry

## 2019-09-17 DIAGNOSIS — F5104 Psychophysiologic insomnia: Secondary | ICD-10-CM | POA: Diagnosis not present

## 2019-09-17 DIAGNOSIS — F331 Major depressive disorder, recurrent, moderate: Secondary | ICD-10-CM

## 2019-09-17 DIAGNOSIS — F988 Other specified behavioral and emotional disorders with onset usually occurring in childhood and adolescence: Secondary | ICD-10-CM | POA: Diagnosis not present

## 2019-09-17 DIAGNOSIS — F322 Major depressive disorder, single episode, severe without psychotic features: Secondary | ICD-10-CM

## 2019-09-17 DIAGNOSIS — F10288 Alcohol dependence with other alcohol-induced disorder: Secondary | ICD-10-CM

## 2019-09-17 MED ORDER — BUPROPION HCL ER (XL) 150 MG PO TB24: 150.0000 mg | ORAL_TABLET | ORAL | 1 refills | Status: DC

## 2019-09-17 NOTE — Progress Notes (Signed)
[facility] Follow up visit  Patient Identification: Philip Daugherty MRN:  [id number] Date of Evaluation:  09/17/2019 Referral Source: primary care Chief Complaint:   depression follow up Visit Diagnosis:    ICD-10-CM   1. Major depressive disorder, recurrent episode, moderate (HCC)  F33.1   2. Psychophysiological insomnia  F51.04   3. Depression, major, single episode, severe ([place])  F32.2   4. Alcohol dependence with other alcohol-induced disorder ([place])  F10.288   5. Attention deficit disorder (ADD) without hyperactivity  F98.8    I connected with [name] on 09/17/19 at  1:45 PM EST by a video enabled telemedicine application and verified that I am speaking with the correct person using two identifiers.   I discussed the limitations of evaluation and management by telemedicine and the availability of in person appointments. The patient expressed understanding and agreed to proceed.   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  Referred initially  by primary care physician for management of depression  Last visit started wellbutrin for depression, aDhd. Advised to cut down adderall and primary care can continue low dose  He was stressed of his job but has got another offer, looking forward, depression is better Still struggles with fogginess and inattention, poor sleep and snores Got eyes tested and doc also asked about sleep apnea No psychotic symptoms or manic symptoms  Says have cut down beer intake and have stopped marijuana as we discussed its effect on fogginess  Aggravating factor: job stress, finances Modifying factor: kids,wife  Duration more then 2 years but more so last 2 months  Alcohol use: 2-3 beer daily for last 20 years Marijuana use: sporadic   Has used xanax prn for flight fear. Does not take regularly     Past Psychiatric History: depression   Past Medical History:  Past Medical  History:  Diagnosis Date  . Acute pancreatitis 05/17/2016   Admitted to hosp while out of town in [place].  Likely secondary to ETOH.  GB/stone w/u NEG.  . Allergy to cats    takes claritin  . Asthma    childhood  . Depression    wellbutrin helped in the past; pt cites failure to respond to prozac, zoloft, and celexa  . Fear of flying    +insomnia; was on xanax regulary for long period and then abruptly stopped it and had w/drawal seizures.  . Hypogonadism male 2012 and 2016   Managed by Dr. Loanne Drilling ([facility]) as of 04/2017, but this was only brief.  I restarted pt on testos IM at end of Oct 2019.  . 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 Era Bumpers)  . Obesity, morbid, BMI 40.0-49.9 ([place])   . Palpitations    24h Holter.: sinus tachycardia  . Prediabetes 01/2017   A1c 6.3% ; 6.1% Dec 2018.  Marland Kitchen Syncope due to orthostatic hypotension    d/c'd from [employer] due to this.  Says if Wt 235 lbs or more then this is not a problem   History reviewed. No pertinent surgical history.  Family Psychiatric History: mom bipolar Depression in family   Family History:  Family History  Problem Relation Age of Onset  . Depression Mother        bipolar disorder  . Parkinsonism Mother   . Diabetes Father   . Other Father        low testosterone  Social History:   Social History   Socioeconomic History  . Marital status: Married    Spouse name: Not on file  . Number of children: Not on file  . Years of education: Not on file  . Highest education level: Not on file  Occupational History  . Not on file  Social Needs  . Financial resource strain: Not on file  . Food insecurity    Worry: Not on file    Inability: Not on file  . Transportation needs    Medical: Not on file    Non-medical: Not on file  Tobacco Use  . Smoking status: Never Smoker  . Smokeless tobacco: Never Used  Substance and Sexual Activity  . Alcohol use: Yes    Comment:  has decreased since 01/04/12  . Drug use: No  . Sexual activity: Not on file  Lifestyle  . Physical activity    Days per week: Not on file    Minutes per session: Not on file  . Stress: Not on file  Relationships  . Social Herbalist on phone: Not on file    Gets together: Not on file    Attends religious service: Not on file    Active member of club or organization: Not on file    Attends meetings of clubs or organizations: Not on file    Relationship status: Not on file  Other Topics Concern  . Not on file  Social History Narrative   Married, second marriage.  No children.   Orig from [place], spent time in [place], college in [place].   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   No tobacco.  Alcohol: 3-4 bourbon drinks most days, cut back as of 12/2011.   Exercise: sporadic (elliptical, walking).  No hx of drug use/abuse.       Married now and have 2 daughters  Allergies:  No Known Allergies  Metabolic Disorder Labs: Lab Results  Component Value Date   HGBA1C 6.1 10/01/2017   Lab Results  Component Value Date   PROLACTIN 5.9 07/24/2018   PROLACTIN 5.4 04/10/2017   Lab Results  Component Value Date   CHOL 166 05/23/2016   TRIG 149.0 05/23/2016   HDL 29.60 (L) 05/23/2016   CHOLHDL 6 05/23/2016   VLDL 29.8 05/23/2016   LDLCALC 107 (H) 05/23/2016   LDLCALC 129 (H) 10/05/2014   Lab Results  Component Value Date   TSH 2.43 07/24/2018    Therapeutic Level Labs: No results found for: LITHIUM No results found for: CBMZ No results found for: VALPROATE  Current Medications: Current Outpatient Medications  Medication Sig Dispense Refill  . amphetamine-dextroamphetamine (ADDERALL XR) 30 MG 24 hr capsule Take 1 capsule (30 mg total) by mouth every morning. 90 capsule 0  . amphetamine-dextroamphetamine (ADDERALL) 10 MG tablet  1-2 tabs po q afternoon 180 tablet 0  . buPROPion (WELLBUTRIN XL) 150 MG 24 hr tablet Take 1 tablet (150 mg total) by mouth every morning. 30 tablet 1  . DULoxetine (CYMBALTA) 60 MG capsule Take 1 capsule (60 mg total) by mouth daily. 90 capsule 3  . levocetirizine (XYZAL) 5 MG tablet Take 5 mg by mouth 2 (two) times daily.    Marland Kitchen testosterone cypionate (DEPOTESTOSTERONE CYPIONATE) 200 MG/ML injection 1/2 ml IM every 7 days (Patient not taking: Reported on 07/23/2019) 2 mL 0   No current  facility-administered medications for this visit.     Musculoskeletal:   Psychiatric Specialty Exam: Review of Systems  Cardiovascular: Negative for chest pain.  Skin: Negative for rash.  Psychiatric/Behavioral: Negative for suicidal ideas.    There were no vitals taken for this visit.There is no height or weight on file to calculate BMI.  General Appearance: Casual  Eye Contact:  Fair  Speech:  Slow  Volume:  Normal  Mood: better  Affect:  Congruent  Thought Process:  Goal Directed  Orientation:  Full (Time, Place, and Person)  Thought Content:  rumination  Suicidal Thoughts:  No  Homicidal Thoughts:  No  Memory:  Immediate;   Fair Recent;   Fair  Judgement:  Fair  Insight:  Shallow  Psychomotor Activity:  Decreased  Concentration:  Concentration: Fair and Attention Span: Fair  Recall:  [employer] [place]: Fair  Akathisia:  No  Handed:  Right  AIMS (if indicated):  not done  Assets:  Desire for Improvement Housing Social Support  ADL's:  Intact  Cognition: WNL  Sleep:  Fair   Screenings: GAD-7     Office Visit from 03/11/2019 in [facility]  Total GAD-7 Score  0    PHQ2-9     Office Visit from 07/23/2019 in [facility] at [facility] Visit from 03/11/2019 in [facility] Visit from 07/24/2018 in [facility] Visit from 10/01/2017 in [facility] Visit from 05/23/2017 in [facility]  PHQ-2 Total Score  5  0  0  0  0  PHQ-9 Total Score  19  1  4  5  2       Assessment and Plan: as follows  MDD recurrent severe: better, continue wellbutrin and cymbalta. GADL better, continue cymbalta Alcohol use: discussed to abstain as it can cause fogginess and dependence ADHD:  Rule out sleep apnea, pcp can continue low dose adderall for now Refer to sleep clinic for rule out sleep apnea contributing to fogginess, inattention and tiredness  Denies suicidal plan or toughts as of now, discussed coping techniques and distraction   I discussed the assessment and treatment plan with the patient. The patient was provided an opportunity to ask questions and all were answered. The patient agreed with the plan and demonstrated an understanding of the instructions.   The patient was advised to call back or seek an in-person evaluation if the symptoms worsen or if the condition fails to improve as anticipated.  I provided 25  minutes of non-face-to-face time during this encounter.  Fu 33m.     Merian Capron, MD 12/3/20202:06 PM

## 2019-09-28 ENCOUNTER — Telehealth (HOSPITAL_COMMUNITY): Payer: Self-pay | Admitting: Psychiatry

## 2019-09-28 NOTE — Telephone Encounter (Signed)
Pt needs a 90day supply of cymbalta sent to [facility].  He states you both talked about this at last visit and you would take it over.

## 2019-09-29 MED ORDER — DULOXETINE HCL 60 MG PO CPEP: 60.0000 mg | ORAL_CAPSULE | Freq: Every day | ORAL | 0 refills | Status: DC

## 2019-09-29 NOTE — Telephone Encounter (Signed)
Ok sent!

## 2019-10-05 ENCOUNTER — Ambulatory Visit
Admission: EM | Admit: 2019-10-05 | Discharge: 2019-10-05 | Disposition: A | Discharge status: Home / Self Care | Payer: 59 | Attending: Physician Assistant | Admitting: Physician Assistant

## 2019-10-05 DIAGNOSIS — J209 Acute bronchitis, unspecified: Secondary | ICD-10-CM | POA: Diagnosis not present

## 2019-10-05 MED ORDER — PREDNISONE 50 MG PO TABS: 50.0000 mg | ORAL_TABLET | Freq: Every day | ORAL | 0 refills | Status: DC

## 2019-10-05 MED ORDER — HYDROCODONE-HOMATROPINE 5-1.5 MG/5ML PO SYRP: 5.0000 mL | ORAL_SOLUTION | Freq: Four times a day (QID) | ORAL | 0 refills | Status: DC | PRN

## 2019-10-05 MED ORDER — DEXAMETHASONE SODIUM PHOSPHATE 10 MG/ML IJ SOLN
10.0000 mg | Freq: Once | INTRAMUSCULAR | Status: AC
  Administered 2019-10-05: 15:00:00 10 mg via INTRAMUSCULAR

## 2019-10-05 MED ORDER — DOXYCYCLINE HYCLATE 100 MG PO CAPS: 100.0000 mg | ORAL_CAPSULE | Freq: Two times a day (BID) | ORAL | 0 refills | Status: DC

## 2019-10-05 NOTE — ED Triage Notes (Signed)
Pt c/o cough, fatigue, nasal/chest congestion, and SOB x3 1/2 wks. Had a neg COVID test on 09/24/19. Hx of bronchitis and feels the same.

## 2019-10-05 NOTE — Discharge Instructions (Signed)
Decadron injection in office today.  Start prednisone, doxycycline as directed.  Continue albuterol as needed.  Hycodan as needed for cough.  Follow-up with PCP for further evaluation if symptoms not improving.  If shortness of breath worsens, unable to walk on own due to shortness of breath, go to the emergency department for further evaluation.

## 2019-10-05 NOTE — ED Provider Notes (Signed)
[name]    CSN: [id number] Arrival date & time: 10/05/19  1249      History   Chief Complaint Chief Complaint  Patient presents with  . Cough    HPI Philip Daugherty is a 43 y.o. male.   43 year old male comes in for 3.5 week history of URI symptoms. Has had cough, fatigue, nasal congestion, chest congestion, shortness of breath. States shortness of breath usually after coughing fit, but can also be on exertion. Had some chills. Denies fever, body aches. Denies loss of taste/smell. Never smoker. History of bronchitis. Albuterol inhaler, mucinex with some relief. Negative COVID testing since symptoms started.      Past Medical History:  Diagnosis Date  . Acute pancreatitis 05/17/2016   Admitted to hosp while out of town in [place].  Likely secondary to ETOH.  GB/stone w/u NEG.  . Allergy to cats    takes claritin  . Asthma    childhood  . Depression    wellbutrin helped in the past; pt cites failure to respond to prozac, zoloft, and celexa  . Fear of flying    +insomnia; was on xanax regulary for long period and then abruptly stopped it and had w/drawal seizures.  . Hypogonadism male 2012 and 2016   Managed by Dr. Loanne Drilling (endo) as of 04/2017, but this was only brief.  I restarted pt on testos IM at end of Oct 2019.  . 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 Era Bumpers)  . Obesity, morbid, BMI 40.0-49.9 ([place])   . Palpitations    24h Holter.: sinus tachycardia  . Prediabetes 01/2017   A1c 6.3% ; 6.1% Dec 2018.  Marland Kitchen Syncope due to orthostatic hypotension    d/c'd from [employer] due to this.  Says if Wt 235 lbs or more then this is not a problem    Patient Active Problem List   Diagnosis Date Noted  . Morbid obesity ([place]) 07/25/2019  . Alcohol dependence with other alcohol-induced disorder ([place]) 07/25/2019  . Psychophysiological insomnia 07/25/2019  . Depression, major, single episode, severe ([place]) 07/25/2019   . Suicidal ideations 07/25/2019  . 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 GAD (generalized anxiety disorder) 07/25/2019  . Major depressive disorder, recurrent episode, moderate ([place]) 05/28/2014  . Situational anxiety 03/19/2014  . Asthmatic bronchitis 08/06/2012  . Hypogonadism, male 01/28/2012  . Infertility male 01/28/2012  . Preventative health care 01/28/2012    History reviewed. No pertinent surgical history.     Home Medications    Prior to Admission medications   Medication Sig Start Date End Date Taking? Authorizing Provider  amphetamine-dextroamphetamine (ADDERALL XR) 30 MG 24 hr capsule Take 1 capsule (30 mg total) by mouth every morning. 07/07/19   McGowen, Adrian Blackwater, MD  amphetamine-dextroamphetamine (ADDERALL) 10 MG tablet 1-2 tabs po q afternoon 07/07/19   McGowen, Adrian Blackwater, MD  buPROPion (WELLBUTRIN XL) 150 MG 24 hr tablet Take 1 tablet (150 mg total) by mouth every morning. 09/17/19 09/16/20  Merian Capron, MD  doxycycline (VIBRAMYCIN) 100 MG capsule Take 1 capsule (100 mg total) by mouth 2 (two) times daily. 10/05/19   Tasia Catchings, Amy V, PA-C  DULoxetine (CYMBALTA) 60 MG capsule Take 1 capsule (60 mg total) by mouth daily. 09/29/19   Merian Capron, MD  HYDROcodone-homatropine [facility]) 5-1.5 MG/5ML syrup Take 5 mLs by mouth every 6 (six) hours as needed for cough. 10/05/19   Tasia Catchings, Amy V, PA-C  levocetirizine (  XYZAL) 5 MG tablet Take 5 mg by mouth 2 (two) times daily.    [provider]  predniSONE (DELTASONE) 50 MG tablet Take 1 tablet (50 mg total) by mouth daily with breakfast. 10/05/19   Tasia Catchings, Amy V, PA-C  testosterone cypionate (DEPOTESTOSTERONE CYPIONATE) 200 MG/ML injection 1/2 ml IM every 7 days Patient not taking: Reported on 07/23/2019 03/30/19   Tammi Sou, MD  PROAIR HFA 108 [phone] Base) MCG/ACT inhaler Inhale 1-2 puffs into the lungs every 4 (four) hours as needed for wheezing or shortness of breath. Patient not taking: Reported  on 03/11/2019 11/13/16 06/29/19  Tammi Sou, MD    Family History Family History  Problem Relation Age of Onset  . Depression Mother        bipolar disorder  . Parkinsonism Mother   . Diabetes Father   . Other Father        low testosterone    Social History Social History   Tobacco Use  . Smoking status: Never Smoker  . Smokeless tobacco: Never Used  Substance Use Topics  . Alcohol use: Yes    Comment: has decreased since 01/04/12  . Drug use: No     Allergies   Patient has no known allergies.   Review of Systems Review of Systems  Reason unable to perform ROS: See HPI as above.     Physical Exam Triage Vital Signs ED Triage Vitals  Enc Vitals Group     BP 10/05/19 1405 123/84     Pulse Rate 10/05/19 1405 (!) 104     Resp 10/05/19 1405 16     Temp 10/05/19 1405 98.4 F (36.9 C)     Temp Source 10/05/19 1405 Oral     SpO2 10/05/19 1405 94 %     Weight --      Height --      Head Circumference --      Peak Flow --      Pain Score 10/05/19 1406 0     Pain Loc --      Pain Edu? --      Excl. in [place]? --    No data found.  Updated Vital Signs BP 123/84 (BP Location: Left Arm)   Pulse (!) 104   Temp 98.4 F (36.9 C) (Oral)   Resp 16   SpO2 94%   Physical Exam Constitutional:      General: He is not in acute distress.    Appearance: Normal appearance. He is not ill-appearing, toxic-appearing or diaphoretic.  HENT:     Head: Normocephalic and atraumatic.     Mouth/Throat:     Mouth: Mucous membranes are moist.     Pharynx: Oropharynx is clear. Uvula midline.  Cardiovascular:     Rate and Rhythm: Normal rate and regular rhythm.     Heart sounds: Normal heart sounds. No murmur. No friction rub. No gallop.   Pulmonary:     Effort: Pulmonary effort is normal. No accessory muscle usage, prolonged expiration, respiratory distress or retractions.     Comments: Lungs clear to auscultation without adventitious lung sounds. Bronchitic cough throughout  visit. Musculoskeletal:     Cervical back: Normal range of motion and neck supple.  Neurological:     General: No focal deficit present.     Mental Status: He is alert and oriented to person, place, and time.      UC Treatments / Results  Labs (all labs ordered are listed, but only abnormal results  are displayed) Labs Reviewed - No data to display  EKG   Radiology No results found.  Procedures Procedures (including critical care time)  Medications Ordered in UC Medications  dexamethasone (DECADRON) injection 10 mg (10 mg Intramuscular Given 10/05/19 1438)    Initial Impression / Assessment and Plan / UC Course  I have reviewed the triage vital signs and the nursing notes.  Pertinent labs & imaging results that were available during my care of the patient were reviewed by me and considered in my medical decision making (see chart for details).    Decadron injection in office today. Prednisone and doxycycline as directed. Albuterol as needed. Hycodan as needed for cough. Return precautions given. Patient expresses understanding and agrees to plan.  Final Clinical Impressions(s) / UC Diagnoses   Final diagnoses:  Acute bronchitis, unspecified organism   ED Prescriptions    Medication Sig Dispense Auth. Provider   doxycycline (VIBRAMYCIN) 100 MG capsule Take 1 capsule (100 mg total) by mouth 2 (two) times daily. 20 capsule Yu, Amy V, PA-C   predniSONE (DELTASONE) 50 MG tablet Take 1 tablet (50 mg total) by mouth daily with breakfast. 5 tablet Yu, Amy V, PA-C   HYDROcodone-homatropine (HYCODAN) 5-1.5 MG/5ML syrup Take 5 mLs by mouth every 6 (six) hours as needed for cough. 120 mL Tasia Catchings, Amy V, PA-C     I have reviewed the PDMP during this encounter.   Ok Edwards, PA-C 10/05/19 1439

## 2019-10-28 ENCOUNTER — Ambulatory Visit: Payer: 59 | Attending: Internal Medicine

## 2019-10-28 DIAGNOSIS — Z20822 Contact with and (suspected) exposure to covid-19: Secondary | ICD-10-CM

## 2019-10-29 ENCOUNTER — Other Ambulatory Visit: Payer: 59

## 2019-10-29 LAB — NOVEL CORONAVIRUS, NAA: SARS-CoV-2, NAA: NOT DETECTED

## 2019-12-15 ENCOUNTER — Other Ambulatory Visit (HOSPITAL_COMMUNITY): Payer: Self-pay | Admitting: Psychiatry

## 2020-01-14 ENCOUNTER — Encounter: Payer: Self-pay | Admitting: Neurology

## 2020-01-14 ENCOUNTER — Ambulatory Visit: Payer: 59 | Admitting: Neurology

## 2020-01-14 ENCOUNTER — Other Ambulatory Visit: Payer: Self-pay

## 2020-01-14 VITALS — BP 148/101 | HR 88 | Temp 96.8°F | Ht 73.0 in | Wt 329.0 lb

## 2020-01-14 DIAGNOSIS — R351 Nocturia: Secondary | ICD-10-CM

## 2020-01-14 DIAGNOSIS — Z6841 Body Mass Index (BMI) 40.0 and over, adult: Secondary | ICD-10-CM

## 2020-01-14 DIAGNOSIS — R0683 Snoring: Secondary | ICD-10-CM | POA: Diagnosis not present

## 2020-01-14 DIAGNOSIS — G479 Sleep disorder, unspecified: Secondary | ICD-10-CM

## 2020-01-14 DIAGNOSIS — G4700 Insomnia, unspecified: Secondary | ICD-10-CM

## 2020-01-14 DIAGNOSIS — G4719 Other hypersomnia: Secondary | ICD-10-CM | POA: Diagnosis not present

## 2020-01-14 NOTE — Patient Instructions (Signed)

## 2020-01-14 NOTE — Progress Notes (Signed)
Subjective:    Patient ID: MONA THOMMEN is a 44 y.o. male.  HPI     Star Age, MD, PhD [facility] [address] [place], [place] [address]   Dear Dr. Nicole Cella,   I saw your patient, Ahlijah Kobus, upon your kind request in my sleep clinic today for initial consultation of his sleep disorder, in particular, concern for underlying obstructive sleep apnea.  The patient is unaccompanied today.  As you know, [name] is a 44 year old right-handed gentleman with an underlying medical history of prediabetes, palpitations, depression, asthma, allergies, history of pancreatitis, history of syncope and morbid obesity with a BMI of over 40, who reports snoring and excessive daytime somnolence.  His Epworth sleepiness score is 12 out of 24 today, fatigue severity score is 61 out of 63.  Of note, his blood pressure is quite elevated today, he is not on any antihypertensive medication. I reviewed your virtual visit note from 09/17/2019.  He has a history of ADD.  He has had difficulty initiating and maintaining sleep at times.  He has taken an over-the-counter medication that contains melatonin and tryptophan.  He used to be on alprazolam for years and reports that he has some leftover pills that he takes it primarily for fear of flight but sometimes for sleep.  He feels exhausted all the time, does not wake up rested, is a restless sleeper but denies any telltale symptoms of restless leg syndrome.  He suspects that his father may have sleep apnea but no one has been officially diagnosed with OSA in his family.  He has nocturia about once or twice per average night and denies recurrent morning headaches.  He is married and lives with his family including wife and 2 young children, ages 44 and 44.  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  He has gained weight in the recent past.  He drinks caffeine in the form of coffee, 3 to 4  cups/day, soda occasionally, typically no tea.  He is a non-smoker and drinks alcohol on a fairly regular basis, up to 10-14 drinks per week, typically in the form of beer or whiskey.  He goes to bed typically before 10 and rise time is between 7 and 7:30 AM.  He has no TV in the bedroom.  They have 2 dogs in the household, 1 small dog typically sleeps on the bed with them.  Cannot sleep in their own bedrooms.  He has been on Adderall for ADD per primary care physician.  His Past Medical History Is Significant For: Past Medical History:  Diagnosis Date  . Acute pancreatitis 05/17/2016   Admitted to hosp while out of town in [place].  Likely secondary to ETOH.  GB/stone w/u NEG.  . Allergy to cats    takes claritin  . Asthma    childhood  . Depression    wellbutrin helped in the past; pt cites failure to respond to prozac, zoloft, and celexa  . Fear of flying    +insomnia; was on xanax regulary for long period and then abruptly stopped it and had w/drawal seizures.  . Hypogonadism male 2012 and 2016   Managed by Dr. Loanne Drilling ([facility]) as of 04/2017, but this was only brief.  I restarted pt on testos IM at end of Oct 2019.  . 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 Era Bumpers)  . Obesity, morbid,  BMI 40.0-49.9 ([place])   . Palpitations    24h Holter.: sinus tachycardia  . Prediabetes 01/2017   A1c 6.3% ; 6.1% Dec 2018.  Marland Kitchen Syncope due to orthostatic hypotension    d/c'd from [employer] due to this.  Says if Wt 235 lbs or more then this is not a problem    His Past Surgical History Is Significant For: No past surgical history on file.  His Family History Is Significant For: Family History  Problem Relation Age of Onset  . Depression Mother        bipolar disorder  . Parkinsonism Mother   . Diabetes Father   . Other Father        low testosterone    His Social History Is Significant For: Social History   Socioeconomic History  . Marital  status: Married    Spouse name: Not on file  . Number of children: Not on file  . Years of education: Not on file  . Highest education level: Not on file  Occupational History  . Not on file  Tobacco Use  . Smoking status: Never Smoker  . Smokeless tobacco: Never Used  Substance and Sexual Activity  . Alcohol use: Yes    Comment: has decreased since 01/04/12  . Drug use: No  . Sexual activity: Not on file  Other Topics Concern  . Not on file  Social History Narrative   Married, second marriage.  No children.   Orig from [place], spent time in [place], college in [place].   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   No tobacco.  Alcohol: 3-4 bourbon drinks most days, cut back as of 12/2011.   Exercise: sporadic (elliptical, walking).  No hx of drug use/abuse.      Social Determinants of Health   Financial Resource Strain:   . Difficulty of Paying Living Expenses:   Food Insecurity:   . Worried About Charity fundraiser in the Last Year:   . Arboriculturist in the Last Year:   Transportation Needs:   . Film/video editor (Medical):   Marland Kitchen Lack of Transportation (Non-Medical):   Physical Activity:   . Days of Exercise per Week:   . Minutes of Exercise per Session:   Stress:   . Feeling of Stress :   Social Connections:   . Frequency of Communication with Friends and Family:   . Frequency of Social Gatherings with Friends and Family:   . Attends Religious Services:   . Active Member of Clubs or Organizations:   . Attends Archivist Meetings:   Marland Kitchen Marital Status:     His Allergies Are:  No Known Allergies:   His Current Medications Are:  Outpatient Encounter Medications as of 01/14/2020  Medication Sig  . amphetamine-dextroamphetamine (ADDERALL) 10 MG tablet 1-2 tabs po q afternoon  . buPROPion (WELLBUTRIN XL) 150 MG 24 hr tablet  TAKE 1 TABLET BY MOUTH EVERY MORNING.  . DULoxetine (CYMBALTA) 60 MG capsule Take 1 capsule (60 mg total) by mouth daily.  Marland Kitchen levocetirizine (XYZAL) 5 MG tablet Take 5 mg by mouth 2 (two) times daily.  . [DISCONTINUED] testosterone cypionate (DEPOTESTOSTERONE CYPIONATE) 200 MG/ML injection 1/2 ml IM every 7 days  . [DISCONTINUED] amphetamine-dextroamphetamine (ADDERALL XR) 30 MG 24 hr capsule Take 1 capsule (30 mg total) by mouth every morning.  . [DISCONTINUED] doxycycline (VIBRAMYCIN)  100 MG capsule Take 1 capsule (100 mg total) by mouth 2 (two) times daily.  . [DISCONTINUED] HYDROcodone-homatropine (HYCODAN) 5-1.5 MG/5ML syrup Take 5 mLs by mouth every 6 (six) hours as needed for cough.  . [DISCONTINUED] predniSONE (DELTASONE) 50 MG tablet Take 1 tablet (50 mg total) by mouth daily with breakfast.  . [DISCONTINUED] PROAIR HFA 108 (90 Base) MCG/ACT inhaler Inhale 1-2 puffs into the lungs every 4 (four) hours as needed for wheezing or shortness of breath. (Patient not taking: Reported on 03/11/2019)   No facility-administered encounter medications on file as of 01/14/2020.  :   Review of Systems:  Out of a complete 14 point review of systems, all are reviewed and negative with the exception of these symptoms as listed below:  Review of Systems  Neurological:       Pt presents today to discuss his sleep. Pt has never had a sleep study but does endorse snoring.  Epworth Sleepiness Scale 0= would never doze 1= slight chance of dozing 2= moderate chance of dozing 3= high chance of dozing  Sitting and reading: 2 Watching TV: 1 Sitting inactive in a public place (ex. Theater or meeting): 0 As a passenger in a car for an hour without a break: 3 Lying down to rest in the afternoon: 3 Sitting and talking to someone: 0 Sitting quietly after lunch (no alcohol): 3 In a car, while stopped in traffic: 0 Total: 12    Objective:  Neurological Exam  Physical Exam Physical Examination:    Vitals:   01/14/20 0905  BP: (!) 148/101  Pulse: 88  Temp: (!) 96.8 F (36 C)    General Examination: The patient is a very pleasant 44 y.o. male in no acute distress. He appears well-developed and well-nourished and well groomed.   HEENT: Normocephalic, atraumatic, pupils are equal, round and reactive to light, extraocular tracking is good without limitation to gaze excursion or nystagmus noted. Hearing is grossly intact. Face is symmetric with normal facial animation. Speech is clear with no dysarthria noted. There is no hypophonia. There is no lip, neck/head, jaw or voice tremor. Neck is supple with full range of passive and active motion. There are no carotid bruits on auscultation. Oropharynx exam reveals: moderate mouth dryness, adequate dental hygiene and moderate airway crowding, due to small airway entry and tonsillar size of about 2+ bilaterally.  Mallampati is class III.  Tongue protrudes centrally in palate elevates symmetrically.  Neck circumference is 18-3/8 inches.  He has a mild overbite.  Chest: Clear to auscultation without wheezing, rhonchi or crackles noted.  Heart: S1+S2+0, regular and normal without murmurs, rubs or gallops noted.   Abdomen: Soft, non-tender and non-distended with normal bowel sounds appreciated on auscultation.  Extremities: There is no pitting edema in the distal lower extremities bilaterally.   Skin: Warm and dry without trophic changes noted.   Musculoskeletal: exam reveals no obvious joint deformities, tenderness or joint swelling or erythema.   Neurologically:  Mental status: The patient is awake, alert and oriented in all 4 spheres. His immediate and remote memory, attention, language skills and fund of knowledge are appropriate. There is no evidence of aphasia, agnosia, apraxia or anomia. Speech is clear with normal prosody and enunciation. Thought process is linear. Mood is normal and affect is normal.  Cranial nerves II - XII are as  described above under HEENT exam.  Motor exam: Normal bulk, strength and tone is noted. There is no tremor, Romberg is negative. Fine motor skills  and coordination: grossly intact.  Cerebellar testing: No dysmetria or intention tremor. There is no truncal or gait ataxia.  Sensory exam: intact to light touch in the upper and lower extremities.  Gait, station and balance: He stands easily. No veering to one side is noted. No leaning to one side is noted. Posture is age-appropriate and stance is narrow based. Gait shows normal stride length and normal pace. No problems turning are noted. Tandem walk is unremarkable.                Assessment and Plan:  In summary, GOLAN SEGO is a very pleasant 44 y.o.-year old male with an underlying medical history of prediabetes, palpitations, depression, asthma, allergies, history of pancreatitis, history of syncope and morbid obesity with a BMI of over 40, whose history and physical exam are concerning for obstructive sleep apnea (OSA). I had a long chat with the patient about my findings and the diagnosis of OSA, its prognosis and treatment options. We talked about medical treatments, surgical interventions and non-pharmacological approaches. I explained in particular the risks and ramifications of untreated moderate to severe OSA, especially with respect to developing cardiovascular disease down the Road, including congestive heart failure, difficult to treat hypertension, cardiac arrhythmias, or stroke. Even type 2 diabetes has, in part, been linked to untreated OSA. Symptoms of untreated OSA include daytime sleepiness, memory problems, mood irritability and mood disorder such as depression and anxiety, lack of energy, as well as recurrent headaches, especially morning headaches. We talked about trying to maintain a healthy lifestyle in general, as well as the importance of weight control. We also talked about the importance of good sleep hygiene. I recommended  the following at this time: sleep study.   I explained the sleep test procedure to the patient and also outlined possible surgical and non-surgical treatment options of OSA, including the use of a custom-made dental device (which would require a referral to a specialist dentist or oral surgeon), upper airway surgical options, such as traditional UPPP or a novel less invasive surgical option in the form of Inspire hypoglossal nerve stimulation (which would involve a referral to an ENT surgeon). I also explained the CPAP treatment option to the patient, who indicated that he would be willing to try CPAP if the need arises. I explained the importance of being compliant with PAP treatment, not only for insurance purposes but primarily to improve His symptoms, and for the patient's long term health benefit, including to reduce His cardiovascular risks. I answered all his questions today and the patient was in agreement. I plan to see him back after the sleep study is completed and encouraged him to call with any interim questions, concerns, problems or updates.   Thank you very much for allowing me to participate in the care of this nice patient. If I can be of any further assistance to you please do not hesitate to call me at [phone].  Sincerely,   Star Age, MD, PhD

## 2020-01-29 ENCOUNTER — Other Ambulatory Visit (HOSPITAL_COMMUNITY): Payer: Self-pay | Admitting: Psychiatry

## 2020-02-03 ENCOUNTER — Other Ambulatory Visit: Payer: Self-pay

## 2020-02-03 ENCOUNTER — Ambulatory Visit (INDEPENDENT_AMBULATORY_CARE_PROVIDER_SITE_OTHER): Payer: 59 | Admitting: Neurology

## 2020-02-03 DIAGNOSIS — Z6841 Body Mass Index (BMI) 40.0 and over, adult: Secondary | ICD-10-CM

## 2020-02-03 DIAGNOSIS — R351 Nocturia: Secondary | ICD-10-CM

## 2020-02-03 DIAGNOSIS — R0683 Snoring: Secondary | ICD-10-CM

## 2020-02-03 DIAGNOSIS — G4733 Obstructive sleep apnea (adult) (pediatric): Secondary | ICD-10-CM | POA: Diagnosis not present

## 2020-02-03 DIAGNOSIS — G479 Sleep disorder, unspecified: Secondary | ICD-10-CM

## 2020-02-03 DIAGNOSIS — G4719 Other hypersomnia: Secondary | ICD-10-CM

## 2020-02-03 DIAGNOSIS — G4700 Insomnia, unspecified: Secondary | ICD-10-CM

## 2020-02-09 ENCOUNTER — Encounter: Payer: Self-pay | Admitting: Neurology

## 2020-02-09 ENCOUNTER — Telehealth: Payer: Self-pay

## 2020-02-09 NOTE — Progress Notes (Signed)
Patient referred by Dr. De Nurse, seen by me on 01/14/20, HST on 02/03/20.    Please call and notify the patient that the recent home sleep test showed obstructive sleep apnea in the severe range. While I recommend treatment for this in the form CPAP, his insurance will not approve a sleep study for this. They will likely only approve a trial of autoPAP, which means, that we don't have to bring him in for a sleep study with CPAP, but will let him start using a so called autoPAP machine at home, through a DME company (of his choice, or as per insurance requirement). The DME representative will educate him on how to use the machine, how to put the mask on, etc. I have placed an order in the chart. Please send referral, talk to patient, send report to referring MD. We will need a FU in sleep clinic for 10 weeks post-PAP set up, please arrange that with me or one of our NPs. Thanks,   Star Age, MD, PhD [facility])

## 2020-02-09 NOTE — Telephone Encounter (Signed)
-----   Message from Star Age, MD sent at 02/09/2020  7:20 AM EDT ----- Patient referred by Dr. De Nurse, seen by me on 01/14/20, HST on 02/03/20.    Please call and notify the patient that the recent home sleep test showed obstructive sleep apnea in the severe range. While I recommend treatment for this in the form CPAP, his insurance will not approve a sleep study for this. They will likely only approve a trial of autoPAP, which means, that we don't have to bring him in for a sleep study with CPAP, but will let him start using a so called autoPAP machine at home, through a DME company (of his choice, or as per insurance requirement). The DME representative will educate him on how to use the machine, how to put the mask on, etc. I have placed an order in the chart. Please send referral, talk to patient, send report to referring MD. We will need a FU in sleep clinic for 10 weeks post-PAP set up, please arrange that with me or one of our NPs. Thanks,   Star Age, MD, PhD [facility])

## 2020-02-09 NOTE — Telephone Encounter (Signed)
I called pt. I advised pt that Dr. Rexene Alberts  reviewed their sleep study results and found that pt did have severe osa on the most recent hst. Dr. Rexene Alberts recommends that pt start an autopap for treatment. I reviewed PAP compliance expectations with the pt. Pt is agreeable to starting an auto-PAP. I advised pt that an order will be sent to a DME, [facility], and [facility] will call the pt within about one week after they file with the pt's insurance. [facility] will show the pt how to use the machine, fit for masks, and troubleshoot the auto-PAP if needed. A follow up appt was made for insurance purposes with Ward Givens  on 04/26/2020 at 9 am. Pt verbalized understanding to arrive 30 minutes early and bring their auto-PAP. A letter with all of this information in it will be mailed to the pt as a reminder. I verified with the pt that the address we have on file is correct. Pt verbalized understanding of results. Pt had no questions at this time but was encouraged to call back if questions arise. I have sent the order to [facility] and have received confirmation that they have received the order.

## 2020-02-09 NOTE — Procedures (Signed)
Sleep Study Report   Patient Information     First Name: Philip Daugherty Last Name: Tri Nowling: [id number]  Birth Date: 03-11-1976 Age: 44 Gender: Male  Referring Provider: Dr. De Nurse BMI: 43.5 (W=328 lb, H=6' 1'')  Neck Circ.:  29 '' Epworth:  12/24   Sleep Study Information    Study Date: Feb 03, 2020 S/H/A Version: [id number] / 4.1.1528 / 18  History:    44 year old man with a history of prediabetes, palpitations, depression, asthma, allergies, history of pancreatitis, history of syncope and morbid obesity with a BMI of over 40, who reports snoring and excessive daytime somnolence.  Summary & Diagnosis:     OSA  Recommendations:     This home sleep test demonstrates severe obstructive sleep apnea with a total AHI of 30.8/hour and O2 nadir of 83%. Treatment with positive airway pressure (in the form of CPAP) is recommended. This will require a full night CPAP titration study for proper treatment settings, O2 monitoring and mask fitting. Based on the severity of the sleep disordered breathing an attended titration study is indicated. However, patient's insurance has denied an attended sleep study; therefore, the patient will be advised to proceed with an autoPAP titration/trial at home for now. Please note that untreated obstructive sleep apnea may carry additional perioperative morbidity. Patients with significant obstructive sleep apnea should receive perioperative PAP therapy and the surgeons and particularly the anesthesiologist should be informed of the diagnosis and the severity of the sleep disordered breathing. The patient should be cautioned not to drive, work at heights, or operate dangerous or heavy equipment when tired or sleepy. Review and reiteration of good sleep hygiene measures should be pursued with any patient. Other causes of the patient's symptoms, including circadian rhythm disturbances, an underlying mood disorder, medication effect and/or an underlying medical problem cannot be  ruled out based on this test. Clinical correlation is recommended. The patient and his referring provider will be notified of the test results. The patient will be seen in follow up in sleep clinic at [facility].  I certify that I have reviewed the raw data recording prior to the issuance of this report in accordance with the standards of the American Academy of Sleep Medicine (AASM).  Star Age, MD, PhD [facility]) Diplomat, ABPN (Neurology and Sleep)            Sleep Summary  Oxygen Saturation Statistics   Start Study Time: End Study Time: Total Recording Time:  9:19:43 PM 7:07:42 AM 9 h, 47 min  Total Sleep Time % REM of Sleep Time:  8 h, 7 min  28.5    Mean: 92 Minimum: 83 Maximum: 98  Mean of Desaturations Nadirs (%):   90  Oxygen Desaturation. %:   4-9 10-20 >20 Total  Events Number Total   118  1 99.2 0.8  0 0.0  119 100.0  Oxygen Saturation: <90 <=88 <85 <80 <70  Duration (minutes): Sleep % 7.9 1.6  3.7 0.1  0.8 0.0 0.0 0.0 0.0 0.0     Respiratory Indices      Total Events REM NREM All Night  pRDI:  249  pAHI:  241 ODI:  119  pAHIc:  13  % CSR: 0.0 42.7 42.3 26.9 1.9 27.7 26.4 10.8 1.6 31.8 30.8 15.2 1.7       Pulse Rate Statistics during Sleep (BPM)      Mean: 76 Minimum: 37 Maximum: 102    Indices are calculated using technically valid sleep  time of  7 h, 49 min. Sleep Study Report   pRDI/pAHI are calculated using oxi desaturations = 3%   Body Position Statistics  Position Supine Prone Right Left Non-Supine  Sleep (min) 206.0 125.5 91.5 65.0 282.0  Sleep % 42.2 25.7 18.8 13.3 57.8  pRDI 21.7 44.0 46.4 17.7 39.5  pAHI 21.1 42.1 45.7 16.6 38.1  ODI 8.3 22.7 26.2 5.5 20.4               Left   Prone  Right  Supine    Snoring Statistics Snoring Level (dB) >40 >50 >60 >70 >80 >Threshold (45)  Sleep (min) 133.6 11.3 1.0 0.0 0.0 44.1  Sleep % 27.4 2.3 0.2 0.0 0.0 9.0    Mean: 41  dB Sleep Study Report   Sleep Stages Chart                         Wake  Sleep      Wake  17.01  %    Sleep  82.99  %   Total:  100.00  %                                                       REM  Light  Deep      REM  28.48  %    Light  60.55  %    Deep  10.97  %   Total:  100.00  %                                 Sleep/Wake States  Sleep Stages  Sleep Latency (min):  REM Latency (min):  Number of Wakes:   22   254   15                                                pAHI=30.8                                                            Mild              Moderate                    Severe                                                 5              15                    30  * Reference values are according to AASM guidelines

## 2020-02-09 NOTE — Addendum Note (Signed)
Addended by: Star Age on: 02/09/2020 07:20 AM   Modules accepted: Orders

## 2020-03-07 ENCOUNTER — Other Ambulatory Visit (HOSPITAL_COMMUNITY): Payer: Self-pay | Admitting: Psychiatry

## 2020-04-26 ENCOUNTER — Ambulatory Visit: Payer: Self-pay | Admitting: Adult Health

## 2020-05-03 ENCOUNTER — Other Ambulatory Visit (HOSPITAL_COMMUNITY): Payer: Self-pay | Admitting: Psychiatry

## 2020-05-05 ENCOUNTER — Telehealth (HOSPITAL_COMMUNITY): Payer: Self-pay | Admitting: Psychiatry

## 2020-05-05 MED ORDER — DULOXETINE HCL 60 MG PO CPEP: 60.0000 mg | ORAL_CAPSULE | Freq: Every day | ORAL | 0 refills | Status: DC

## 2020-05-05 MED ORDER — BUPROPION HCL ER (XL) 150 MG PO TB24: 150.0000 mg | ORAL_TABLET | Freq: Every morning | ORAL | 0 refills | Status: DC

## 2020-05-05 NOTE — Telephone Encounter (Signed)
Pt called and left a message. He needs refills on wellbutrin and cymbalta [place] [facility]  I called and left a message informing patient he will need an apt before the next refill can be sent

## 2020-05-05 NOTE — Telephone Encounter (Signed)
Ok sent but he has to be seen within a month

## 2020-05-17 ENCOUNTER — Ambulatory Visit: Payer: Self-pay | Admitting: Adult Health

## 2020-05-30 ENCOUNTER — Ambulatory Visit (INDEPENDENT_AMBULATORY_CARE_PROVIDER_SITE_OTHER): Payer: 59 | Admitting: Physician Assistant

## 2020-05-30 ENCOUNTER — Other Ambulatory Visit: Payer: Self-pay

## 2020-05-30 ENCOUNTER — Encounter: Payer: Self-pay | Admitting: Physician Assistant

## 2020-05-30 VITALS — BP 107/72 | HR 87 | Temp 98.1°F | Ht 73.0 in | Wt 319.8 lb

## 2020-05-30 DIAGNOSIS — F322 Major depressive disorder, single episode, severe without psychotic features: Secondary | ICD-10-CM

## 2020-05-30 DIAGNOSIS — H6593 Unspecified nonsuppurative otitis media, bilateral: Secondary | ICD-10-CM | POA: Diagnosis not present

## 2020-05-30 DIAGNOSIS — F411 Generalized anxiety disorder: Secondary | ICD-10-CM

## 2020-05-30 DIAGNOSIS — G4733 Obstructive sleep apnea (adult) (pediatric): Secondary | ICD-10-CM

## 2020-05-30 DIAGNOSIS — F988 Other specified behavioral and emotional disorders with onset usually occurring in childhood and adolescence: Secondary | ICD-10-CM

## 2020-05-30 MED ORDER — LISDEXAMFETAMINE DIMESYLATE 10 MG PO CAPS: ORAL_CAPSULE | ORAL | 0 refills | Status: DC

## 2020-05-30 MED ORDER — BUPROPION HCL ER (XL) 150 MG PO TB24: 150.0000 mg | ORAL_TABLET | Freq: Every morning | ORAL | 0 refills | Status: DC

## 2020-05-30 MED ORDER — AMPHETAMINE-DEXTROAMPHET ER 30 MG PO CP24: 30.0000 mg | ORAL_CAPSULE | ORAL | 0 refills | Status: DC

## 2020-05-30 MED ORDER — DULOXETINE HCL 60 MG PO CPEP: 60.0000 mg | ORAL_CAPSULE | Freq: Every day | ORAL | 0 refills | Status: DC

## 2020-05-30 NOTE — Progress Notes (Addendum)
Established Patient Office Visit  Subjective:  Patient ID: Philip Daugherty, male    DOB: 11-03-1975  Age: 44 y.o. MRN: [id number]  CC:  Chief Complaint  Patient presents with  . ADD  . Mood    HPI Philip Daugherty presents for follow up on ADD and mood management. Pt also has complaints of bilateral ear drainage, R>L which he usually notices when wearing ea rbuds. Also reports intermittent otalgia. Reports a history of seasonal allergies.  ADD, Mood: Reports he did not have a good connection with psychiatrist and would like for PCP to continue management for ADD and mood. He does see a Government social research officer. He has been on Adderall for 2 to 3 years but feels like therapy is currently not fully effective. Reports he did run out of Adderall ER for about 6 months. States he continues to have daytime sleepiness and feels tired, and isn't sure if it is related to his sleep apnea or mood. States DME company has checked his machine and seems like everything is working fine. He does have an upcoming follow-up with his sleep doctor. He is inquiring about modafinil for ADD.    Past Medical History:  Diagnosis Date  . Acute pancreatitis 05/17/2016   Admitted to hosp while out of town in [place].  Likely secondary to ETOH.  GB/stone w/u NEG.  . Allergy to cats    takes claritin  . Asthma    childhood  . Depression    wellbutrin helped in the past; pt cites failure to respond to prozac, zoloft, and celexa  . Fear of flying    +insomnia; was on xanax regulary for long period and then abruptly stopped it and had w/drawal seizures.  . Hypogonadism male 2012 and 2016   Managed by Dr. Loanne Drilling (endo) as of 04/2017, but this was only brief.  I restarted pt on testos IM at end of Oct 2019.  . 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 Era Bumpers)  . Obesity, morbid, BMI 40.0-49.9 ([place])   . Palpitations    24h Holter.: sinus tachycardia  . Prediabetes 01/2017    A1c 6.3% ; 6.1% Dec 2018.  Marland Kitchen Syncope due to orthostatic hypotension    d/c'd from [employer] due to this.  Says if Wt 235 lbs or more then this is not a problem    Past Surgical History:  Procedure Laterality Date  . EYE SURGERY Bilateral    lasik    Family History  Problem Relation Age of Onset  . Depression Mother        bipolar disorder  . Parkinsonism Mother   . Diabetes Father   . Other Father        low testosterone    Social History   Socioeconomic History  . Marital status: Married    Spouse name: Not on file  . Number of children: Not on file  . Years of education: Not on file  . Highest education level: Not on file  Occupational History  . Not on file  Tobacco Use  . Smoking status: Never Smoker  . Smokeless tobacco: Never Used  Substance and Sexual Activity  . Alcohol use: Yes    Comment: has decreased since 01/04/12  . Drug use: No  . Sexual activity: Not on file  Other Topics Concern  . Not on file  Social History Narrative   Married, second marriage.  No children.   Orig from [place], spent  time in [place], college in [place].   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   No tobacco.  Alcohol: 3-4 bourbon drinks most days, cut back as of 12/2011.   Exercise: sporadic (elliptical, walking).  No hx of drug use/abuse.      Social Determinants of Health   Financial Resource Strain:   . Difficulty of Paying Living Expenses: Not on file  Food Insecurity:   . Worried About Charity fundraiser in the Last Year: Not on file  . Ran Out of Food in the Last Year: Not on file  Transportation Needs:   . Lack of Transportation (Medical): Not on file  . Lack of Transportation (Non-Medical): Not on file  Physical Activity:   . Days of Exercise per Week: Not on file  . Minutes of Exercise per Session: Not on file  Stress:   . Feeling of  Stress : Not on file  Social Connections:   . Frequency of Communication with Friends and Family: Not on file  . Frequency of Social Gatherings with Friends and Family: Not on file  . Attends Religious Services: Not on file  . Active Member of Clubs or Organizations: Not on file  . Attends Archivist Meetings: Not on file  . Marital Status: Not on file  Intimate Partner Violence:   . Fear of Current or Ex-Partner: Not on file  . Emotionally Abused: Not on file  . Physically Abused: Not on file  . Sexually Abused: Not on file    Outpatient Medications Prior to Visit  Medication Sig Dispense Refill  . levocetirizine (XYZAL) 5 MG tablet Take 5 mg by mouth 2 (two) times daily.    Marland Kitchen amphetamine-dextroamphetamine (ADDERALL) 10 MG tablet 1-2 tabs po q afternoon 180 tablet 0  . buPROPion (WELLBUTRIN XL) 150 MG 24 hr tablet Take 1 tablet (150 mg total) by mouth every morning. 30 tablet 0  . DULoxetine (CYMBALTA) 60 MG capsule Take 1 capsule (60 mg total) by mouth daily. 30 capsule 0   No facility-administered medications prior to visit.    No Known Allergies  ROS Review of Systems  A fourteen system review of systems was performed and found to be positive as per HPI.  Objective:    Physical Exam General:  Well Developed, well nourished, appropriate for stated age.  Neuro:  Alert and oriented,  extra-ocular muscles intact  HEENT:  Normocephalic, atraumatic, middle ear effusion of both ears, neck supple Skin:  no gross rash, warm, pink. Cardiac:  RRR, S1 S2 Respiratory:  ECTA B/L and A/P, Not using accessory muscles, speaking in full sentences- unlabored. Vascular:  Ext warm, no cyanosis apprec.; cap RF less 2 sec. Psych:  No HI/SI, judgement and insight good, Euthymic mood. Full Affect.    BP 107/72   Pulse 87   Temp 98.1 F (36.7 C) (Oral)   Ht 6\' 1"  (1.854 m)   Wt (!) 319 lb 12.8 oz (145.1 kg)   SpO2 96% Comment: on RA  BMI 42.19 kg/m  Wt Readings from Last 3  Encounters:  05/30/20 (!) 319 lb 12.8 oz (145.1 kg)  01/14/20 (!) 329 lb (149.2 kg)  07/23/19 (!) 319 lb (144.7 kg)     Health Maintenance Due  Topic Date Due  . Hepatitis C Screening  Never done  . COVID-19 Vaccine (1) Never done  . INFLUENZA VACCINE  05/15/2020    There are no preventive care reminders to display for this patient.  Lab Results  Component Value Date   TSH 2.43 07/24/2018   Lab Results  Component Value Date   WBC 6.9 05/23/2016   HGB 15.1 05/23/2016   HCT 45.9 05/23/2016   MCV 88.7 05/23/2016   PLT 371.0 05/23/2016   Lab Results  Component Value Date   NA 138 10/01/2017   K 4.6 10/01/2017   CO2 32 10/01/2017   GLUCOSE 88 10/01/2017   BUN 11 10/01/2017   CREATININE 0.88 10/01/2017   BILITOT 0.4 02/06/2017   ALKPHOS 80 02/06/2017   AST 32 02/06/2017   ALT 42 02/06/2017   PROT 7.6 02/06/2017   ALBUMIN 4.6 02/06/2017   CALCIUM 9.4 10/01/2017   GFR 101.42 10/01/2017   Lab Results  Component Value Date   CHOL 166 05/23/2016   Lab Results  Component Value Date   HDL 29.60 (L) 05/23/2016   Lab Results  Component Value Date   LDLCALC 107 (H) 05/23/2016   Lab Results  Component Value Date   TRIG 149.0 05/23/2016   Lab Results  Component Value Date   CHOLHDL 6 05/23/2016   Lab Results  Component Value Date   HGBA1C 6.1 10/01/2017      Assessment & Plan:   Problem List Items Addressed This Visit      Other   Depression, major, single episode, severe ([place]) - Primary   Relevant Medications   DULoxetine (CYMBALTA) 60 MG capsule   buPROPion (WELLBUTRIN XL) 150 MG 24 hr tablet   GAD (generalized anxiety disorder)   Relevant Medications   DULoxetine (CYMBALTA) 60 MG capsule   buPROPion (WELLBUTRIN XL) 150 MG 24 hr tablet    Other Visit Diagnoses    Attention deficit disorder (ADD) in adult       Relevant Medications   amphetamine-dextroamphetamine (ADDERALL XR) 30 MG 24 hr capsule   lisdexamfetamine (VYVANSE) 10 MG capsule    Middle ear effusion, bilateral         ADD: -Discussed with patient risks vs benefits of modafinil and indications for use, and advised changing current medication regimen to Adderall in the morning and Vyvanse in the afternoon. Discussed follow-up every 3 months for medication management. Pt verbalized understanding and is agreeable. -PDMP reviewed, no aberrancies noted. -Will continue to monitor.  GAD, Major Depression: -Stable, Denies SI/HI. -Continue current medication regimen. -Continue BH therapy sessions. -Encourage to incorporate nonpharmacological therapy such as exercise, relaxation techniques, or use calm app.   Office Visit from 05/30/2020 in [place] at [facility]  PHQ-9 Total Score 6     B/L MEE: -Recommend to use OTC antihistamine medication and/or Flonase.  OSA: -Recommend to follow up with Sleep Center as scheduled to evaluate if symptoms of daytime sleepiness and fatigue could be related to OSA.   Meds ordered this encounter  Medications  . amphetamine-dextroamphetamine (ADDERALL XR) 30 MG 24 hr capsule    Sig: Take 1 capsule (30 mg total) by mouth every morning.    Dispense:  30 capsule    Refill:  0    Order Specific Question:   Supervising Provider    Answer:   Beatrice Lecher D [[id number]]  . lisdexamfetamine (VYVANSE) 10 MG capsule    Sig: Take 1 capsule by mouth in the afternoon.    Dispense:  30 capsule    Refill:  0    Order Specific Question:   Supervising Provider  Answer:   Beatrice Lecher D [[id number]]  . DULoxetine (CYMBALTA) 60 MG capsule    Sig: Take 1 capsule (60 mg total) by mouth daily.    Dispense:  90 capsule    Refill:  0    Order Specific Question:   Supervising Provider    Answer:   Beatrice Lecher D [[id number]]  . buPROPion (WELLBUTRIN XL) 150 MG 24 hr tablet    Sig: Take 1 tablet (150 mg total) by mouth every morning.    Dispense:  90 tablet    Refill:  0    Order Specific Question:   Supervising Provider     Answer:   Beatrice Lecher D [[id number]]    Follow-up: Return in about 3 months (around 08/30/2020) for CPE and FBW.   Note:  This note was prepared with assistance of Dragon voice recognition software. Occasional wrong-word or sound-a-like substitutions may have occurred due to the inherent limitations of voice recognition software.  Lorrene Reid, PA-C

## 2020-05-30 NOTE — Patient Instructions (Signed)
Living With Sleep Apnea Sleep apnea is a condition in which breathing pauses or becomes shallow during sleep. Sleep apnea is most commonly caused by a collapsed or blocked airway. People with sleep apnea snore loudly and have times when they gasp and stop breathing for 10 seconds or more during sleep. This happens over and over during the night. This disrupts your sleep and keeps your body from getting the rest that it needs, which can cause tiredness and lack of energy (fatigue) during the day. The breaks in breathing also interrupt the deep sleep that you need to feel rested. Even if you do not completely wake up from the gaps in breathing, your sleep may not be restful. You may also have a headache in the morning and low energy during the day, and you may feel anxious or depressed. How can sleep apnea affect me? Sleep apnea increases your chances of extreme tiredness during the day (daytime fatigue). It can also increase your risk for health conditions, such as:  Heart attack.  Stroke.  Diabetes.  Heart failure.  Irregular heartbeat.  High blood pressure. If you have daytime fatigue as a result of sleep apnea, you may be more likely to:  Perform poorly at school or work.  Fall asleep while driving.  Have difficulty with attention.  Develop depression or anxiety.  Become severely overweight (obese).  Have sexual dysfunction. What actions can I take to manage sleep apnea? Sleep apnea treatment   If you were given a device to open your airway while you sleep, use it only as told by your health care provider. You may be given: ? An oral appliance. This is a custom-made mouthpiece that shifts your lower jaw forward. ? A continuous positive airway pressure (CPAP) device. This device blows air through a mask when you breathe out (exhale). ? A nasal expiratory positive airway pressure (EPAP) device. This device has valves that you put into each nostril. ? A bi-level positive airway  pressure (BPAP) device. This device blows air through a mask when you breathe in (inhale) and breathe out (exhale).  You may need surgery if other treatments do not work for you. Sleep habits  Go to sleep and wake up at the same time every day. This helps set your internal clock (circadian rhythm) for sleeping. ? If you stay up later than usual, such as on weekends, try to get up in the morning within 2 hours of your normal wake time.  Try to get at least 7-9 hours of sleep each night.  Stop computer, tablet, and mobile phone use a few hours before bedtime.  Do not take long naps during the day. If you nap, limit it to 30 minutes.  Have a relaxing bedtime routine. Reading or listening to music may relax you and help you sleep.  Use your bedroom only for sleep. ? Keep your television and computer out of your bedroom. ? Keep your bedroom cool, dark, and quiet. ? Use a supportive mattress and pillows.  Follow your health care provider's instructions for other changes to sleep habits. Nutrition  Do not eat heavy meals in the evening.  Do not have caffeine in the later part of the day. The effects of caffeine can last for more than 5 hours.  Follow your health care provider's or dietitian's instructions for any diet changes. Lifestyle      Do not drink alcohol before bedtime. Alcohol can cause you to fall asleep at first, but then it can cause   you to wake up in the middle of the night and have trouble getting back to sleep.  Do not use any products that contain nicotine or tobacco, such as cigarettes and e-cigarettes. If you need help quitting, ask your health care provider. Medicines  Take over-the-counter and prescription medicines only as told by your health care provider.  Do not use over-the-counter sleep medicine. You can become dependent on this medicine, and it can make sleep apnea worse.  Do not use medicines, such as sedatives and narcotics, unless told by your health  care provider. Activity  Exercise on most days, but avoid exercising in the evening. Exercising near bedtime can interfere with sleeping.  If possible, spend time outside every day. Natural light helps regulate your circadian rhythm. General information  Lose weight if you need to, and maintain a healthy weight.  Keep all follow-up visits as told by your health care provider. This is important.  If you are having surgery, make sure to tell your health care provider that you have sleep apnea. You may need to bring your device with you. Where to find more information Learn more about sleep apnea and daytime fatigue from:  American Sleep Association: sleepassociation.[place]: sleepfoundation.org  National Heart, Lung, and Blood Institute: https://www.[place].biz/ Summary  Sleep apnea can cause daytime fatigue and other serious health conditions.  Both sleep apnea and daytime fatigue can be bad for your health and well-being.  You may need to wear a device while sleeping to help keep your airway open.  If you are having surgery, make sure to tell your health care provider that you have sleep apnea. You may need to bring your device with you.  Making changes to sleep habits, diet, lifestyle, and activity can help you manage sleep apnea. This information is not intended to replace advice given to you by your health care provider. Make sure you discuss any questions you have with your health care provider. Document Revised: 01/23/2019 Document Reviewed: 12/26/2017 Elsevier Patient Education  [place]. Fatigue If you have fatigue, you feel tired all the time and have a lack of energy or a lack of motivation. Fatigue may make it difficult to start or complete tasks because of exhaustion. In general, occasional or mild fatigue is often a normal response to activity or life. However, long-lasting (chronic) or extreme fatigue may be a symptom of a medical  condition. Follow these instructions at home: General instructions  Watch your fatigue for any changes.  Go to bed and get up at the same time every day.  Avoid fatigue by pacing yourself during the day and getting enough sleep at night.  Maintain a healthy weight. Medicines  Take over-the-counter and prescription medicines only as told by your health care provider.  Take a multivitamin, if told by your health care provider.  Do not use herbal or dietary supplements unless they are approved by your health care provider. Activity   Exercise regularly, as told by your health care provider.  Use or practice techniques to help you relax, such as yoga, tai chi, meditation, or massage therapy. Eating and drinking   Avoid heavy meals in the evening.  Eat a well-balanced diet, which includes lean proteins, whole grains, plenty of fruits and vegetables, and low-fat dairy products.  Avoid consuming too much caffeine.  Avoid the use of alcohol.  Drink enough fluid to keep your urine pale yellow. Lifestyle  Change situations that cause you stress. Try to keep your  work and personal schedule in balance.  Do not use any products that contain nicotine or tobacco, such as cigarettes and e-cigarettes. If you need help quitting, ask your health care provider.  Do not use drugs. Contact a health care provider if:  Your fatigue does not get better.  You have a fever.  You suddenly lose or gain weight.  You have headaches.  You have trouble falling asleep or sleeping through the night.  You feel angry, guilty, anxious, or sad.  You are unable to have a bowel movement (constipation).  Your skin is dry.  You have swelling in your legs or another part of your body. Get help right away if:  You feel confused.  Your vision is blurry.  You feel faint or you pass out.  You have a severe headache.  You have severe pain in your abdomen, your back, or the area between your  waist and hips (pelvis).  You have chest pain, shortness of breath, or an irregular or fast heartbeat.  You are unable to urinate, or you urinate less than normal.  You have abnormal bleeding, such as bleeding from the rectum, vagina, nose, lungs, or nipples.  You vomit blood.  You have thoughts about hurting yourself or others. If you ever feel like you may hurt yourself or others, or have thoughts about taking your own life, get help right away. You can go to your nearest emergency department or call:  Your local emergency services (911 in the [place]  A suicide crisis helpline, such as the National Suicide Prevention Lifeline at 1-800-273-8255. This is open 24 hours a day. Summary  If you have fatigue, you feel tired all the time and have a lack of energy or a lack of motivation.  Fatigue may make it difficult to start or complete tasks because of exhaustion.  Long-lasting (chronic) or extreme fatigue may be a symptom of a medical condition.  Exercise regularly, as told by your health care provider.  Change situations that cause you stress. Try to keep your work and personal schedule in balance. This information is not intended to replace advice given to you by your health care provider. Make sure you discuss any questions you have with your health care provider. Document Revised: 04/22/2019 Document Reviewed: 06/26/2017 Elsevier Patient Education  2020 Elsevier Inc.  

## 2020-06-11 ENCOUNTER — Encounter: Payer: Self-pay | Admitting: Physician Assistant

## 2020-07-06 ENCOUNTER — Other Ambulatory Visit: Payer: Self-pay | Admitting: Physician Assistant

## 2020-07-06 DIAGNOSIS — F988 Other specified behavioral and emotional disorders with onset usually occurring in childhood and adolescence: Secondary | ICD-10-CM

## 2020-07-06 NOTE — Telephone Encounter (Signed)
Patient last seen 05/30/2020 and advised to follow up in 3 months.   Patient has apt scheduled for 08/22/2020.   Last refill of Adderall given 05/30/2020 # 30 no refills.   Last refill of Vyvanse given 05/30/20 # 30 no refills.   Please approve if refills deemed appropriate. AS, CMA

## 2020-08-10 ENCOUNTER — Other Ambulatory Visit: Payer: Self-pay | Admitting: Physician Assistant

## 2020-08-10 DIAGNOSIS — F988 Other specified behavioral and emotional disorders with onset usually occurring in childhood and adolescence: Secondary | ICD-10-CM

## 2020-08-22 ENCOUNTER — Ambulatory Visit (INDEPENDENT_AMBULATORY_CARE_PROVIDER_SITE_OTHER): Payer: 59 | Admitting: Physician Assistant

## 2020-08-22 ENCOUNTER — Other Ambulatory Visit: Payer: Self-pay

## 2020-08-22 ENCOUNTER — Encounter: Payer: Self-pay | Admitting: Physician Assistant

## 2020-08-22 VITALS — BP 123/86 | HR 88 | Ht 73.0 in | Wt 309.0 lb

## 2020-08-22 DIAGNOSIS — Z23 Encounter for immunization: Secondary | ICD-10-CM | POA: Diagnosis not present

## 2020-08-22 DIAGNOSIS — E291 Testicular hypofunction: Secondary | ICD-10-CM

## 2020-08-22 DIAGNOSIS — R5383 Other fatigue: Secondary | ICD-10-CM | POA: Diagnosis not present

## 2020-08-22 DIAGNOSIS — Z0000 Encounter for general adult medical examination without abnormal findings: Secondary | ICD-10-CM

## 2020-08-22 DIAGNOSIS — F988 Other specified behavioral and emotional disorders with onset usually occurring in childhood and adolescence: Secondary | ICD-10-CM

## 2020-08-22 NOTE — Progress Notes (Signed)
Male physical   Impression and Recommendations:    1. Healthcare maintenance   2. Need for influenza vaccination   3. Attention deficit disorder (ADD) in adult   4. Fatigue, unspecified type   5. Hypogonadism, male   6. Obesity, morbid, BMI 40.0-49.9 (HCC)      1) Anticipatory Guidance: Discussed skin CA prevention and sunscreen when outside along with skin surveillance; eating a balanced and modest diet; physical activity at least 25 minutes per day or minimum of 150 min/ week moderate to intense activity.  2) Immunizations / Screenings / Labs:   All immunizations are up-to-date per recommendations or will be updated today if pt allows.    - Patient understands with dental and vision screens they will schedule independently.  - Will obtain CBC, CMP, HgA1c, Lipid panel, and TSH. - UTD on tdap, completed Covid-19 vaccine series - Agreeable to influenza vaccine. - Declined Hep and HIV screenings.  3) Weight:  Discussed goal to improve diet habits to improve overall feelings of well being and objective health data. Improve nutrient density of diet through increasing intake of fruits and vegetables and decreasing saturated fats, white flour products and refined sugars.   4) Healthcare Maintenance: -Continue current medication regimen. -Patient continues to have daytime sleepiness and hypersomnolence and recommend to follow up with sleep center. Patient states has not used CPAP in the past few months and cannot tell a difference with feeling more rested so does not know if he is benefiting from CPAP machine. Reviewed sleep study which revealed severe OSA. Patient's insurance only covered a home sleep test and patient will likely benefit from an attended titration sleep study. -Will add testosterone level to blood work due to complaints of fatigue and patient has a history of hypogonadism. Pending lab results will consider starting testosterone therapy. -Follow a heart healthy diet  and stay as active as possible. -Continue with reduced alcohol use.  -Follow up in 3 months for ADD management, Mood.   Orders Placed This Encounter  Procedures  . Flu Vaccine QUAD 6+ mos PF IM (Fluarix Quad PF)  . CBC  . Comprehensive metabolic panel    Order Specific Question:   Has the patient fasted?    Answer:   Yes  . Hemoglobin A1c  . TSH  . Lipid panel    Order Specific Question:   Has the patient fasted?    Answer:   Yes  . Testosterone,Free and Total    No orders of the defined types were placed in this encounter.    Return in about 3 months (around 11/22/2020) for ADD management .     Gross side effects, risk and benefits, and alternatives of medications discussed with patient.  Patient is aware that all medications have potential side effects and we are unable to predict every side effect or drug-drug interaction that may occur.  Expresses verbal understanding and consents to current therapy plan and treatment regimen.  Please see AVS handed out to patient at the end of our visit for further patient instructions/ counseling done pertaining to today's office visit.  Note:  This note was prepared with assistance of Dragon voice recognition software. Occasional wrong-word or sound-a-like substitutions may have occurred due to the inherent limitations of voice recognition software.    Subjective:      CC: CPE   HPI: Philip Daugherty is a 44 y.o. male who presents to [place] [facility] today for a yearly  health maintenance exam.     Health Maintenance Summary  - Reviewed and updated, unless pt declines services.  Tobacco History Reviewed: Y, never a smoker Alcohol / drug use:  no excessive use, has 5-7 drinks/wk; no use Dental Home: Y Eye exams: Y, not recently, laser sx Dermatology home: N  Male history: STD concerns:   none Additional penile/ urinary concerns: none      Immunization History  Administered Date(s) Administered   . Influenza,inj,Quad PF,6+ Mos 08/15/2015, 09/13/2016, 10/01/2017, 07/24/2018, 07/23/2019, 08/22/2020  . Tdap 01/25/2012     Health Maintenance  Topic Date Due  . Hepatitis C Screening  Never done  . COVID-19 Vaccine (1) Never done  . TETANUS/TDAP  01/24/2022  . INFLUENZA VACCINE  Completed  . HIV Screening  Completed       Wt Readings from Last 3 Encounters:  08/22/20 (!) 309 lb (140.2 kg)  05/30/20 (!) 319 lb 12.8 oz (145.1 kg)  01/14/20 (!) 329 lb (149.2 kg)   BP Readings from Last 3 Encounters:  08/22/20 123/86  05/30/20 107/72  01/14/20 (!) 148/101   Pulse Readings from Last 3 Encounters:  08/22/20 88  05/30/20 87  01/14/20 88    Patient Active Problem List   Diagnosis Date Noted  . Myopia of both eyes 08/17/2019  . Regular astigmatism of left eye 08/17/2019  . Morbid obesity ([place]) 07/25/2019  . Alcohol dependence with other alcohol-induced disorder ([place]) 07/25/2019  . Psychophysiological insomnia 07/25/2019  . Depression, major, single episode, severe ([place]) 07/25/2019  . Suicidal ideations 07/25/2019  . 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 GAD (generalized anxiety disorder) 07/25/2019  . Major depressive disorder, recurrent episode, moderate ([place]) 05/28/2014  . Situational anxiety 03/19/2014  . Asthmatic bronchitis 08/06/2012  . Hypogonadism, male 01/28/2012  . Infertility male 01/28/2012  . Preventative health care 01/28/2012    Past Medical History:  Diagnosis Date  . Acute pancreatitis 05/17/2016   Admitted to hosp while out of town in [place].  Likely secondary to ETOH.  GB/stone w/u NEG.  . Allergy to cats    takes claritin  . Asthma    childhood  . Depression    wellbutrin helped in the past; pt cites failure to respond to prozac, zoloft, and celexa  . Fear of flying    +insomnia; was on xanax regulary for long period and then abruptly stopped it and had w/drawal seizures.  . Hypogonadism male 2012 and 2016    Managed by Dr. Loanne Drilling ([facility]) as of 04/2017, but this was only brief.  I restarted pt on testos IM at end of Oct 2019.  . 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 Era Bumpers)  . Obesity, morbid, BMI 40.0-49.9 ([place])   . Palpitations    24h Holter.: sinus tachycardia  . Prediabetes 01/2017   A1c 6.3% ; 6.1% Dec 2018.  Marland Kitchen Syncope due to orthostatic hypotension    d/c'd from [employer] due to this.  Says if Wt 235 lbs or more then this is not a problem    Past Surgical History:  Procedure Laterality Date  . EYE SURGERY Bilateral    lasik    Family History  Problem Relation Age of Onset  . Depression Mother        bipolar disorder  . Parkinsonism Mother   . Diabetes Father   . Other Father        low testosterone    Social History  Substance and Sexual Activity  Drug Use No  ,  Social History   Substance and Sexual Activity  Alcohol Use Yes   Comment: has decreased since 01/04/12  ,  Social History   Tobacco Use  Smoking Status Never Smoker  Smokeless Tobacco Never Used  ,  Social History   Substance and Sexual Activity  Sexual Activity Not on file    Patient's Medications  New Prescriptions   No medications on file  Previous Medications   ADDERALL XR 30 MG 24 HR CAPSULE    TAKE 1 CAPSULE BY MOUTH EVERY MORNING.   BUPROPION (WELLBUTRIN XL) 150 MG 24 HR TABLET    Take 1 tablet (150 mg total) by mouth every morning.   DULOXETINE (CYMBALTA) 60 MG CAPSULE    Take 1 capsule (60 mg total) by mouth daily.   LEVOCETIRIZINE (XYZAL) 5 MG TABLET    Take 5 mg by mouth 2 (two) times daily.   LISDEXAMFETAMINE (VYVANSE) 10 MG CAPSULE    TAKE 1 CAPSULE BY MOUTH IN THE AFTERNOON.  Modified Medications   No medications on file  Discontinued Medications   No medications on file    Patient has no known allergies.  Review of Systems: General:   Denies fever, chills, unexplained weight loss, +fatigue  Optho/Auditory:   Denies visual  changes, blurred vision/LOV Respiratory:   Denies SOB, DOE more than baseline levels.   Cardiovascular:   Denies chest pain, palpitations, new onset peripheral edema  Gastrointestinal:   Denies nausea, vomiting, diarrhea.  Genitourinary: Denies dysuria, freq/ urgency, flank pain .  Endocrine:     Denies hot or cold intolerance, polyuria, polydipsia. Musculoskeletal:   Denies unexplained myalgias, joint swelling, unexplained arthralgias, gait problems.  Skin:  Denies rash, suspicious lesions Neurological:     Denies dizziness, unexplained weakness, numbness  Psychiatric/Behavioral:   Denies mood changes, suicidal or homicidal ideations, hallucinations    Objective:     Blood pressure 123/86, pulse 88, height 6\' 1"  (1.854 m), weight (!) 309 lb (140.2 kg), SpO2 99 %. Body mass index is 40.77 kg/m. General Appearance:    Alert, cooperative, no distress, appears stated age  Head:    Normocephalic, without obvious abnormality, atraumatic  Eyes:    PERRL, conjunctiva/corneas clear, EOM's intact, both eyes  Ears:    Normal TM's and external ear canals, both ears  Nose:   Nares normal, septum midline, mucosa normal, no drainage    or sinus tenderness  Throat:   Lips w/o lesion, mucosa moist, and tongue normal; teeth and gums normal  Neck:   Supple, symmetrical, trachea midline, no adenopathy;    thyroid:  no enlargement/tenderness/nodules;   Back:     Symmetric, no curvature, ROM normal, no CVA tenderness  Lungs:     Clear to auscultation bilaterally, respirations unlabored, no Wh/ R/ R  Chest Wall:    No tenderness or gross deformity; normal excursion   Heart:    Regular rate and rhythm, S1 and S2 normal, no murmur, rub   or gallop  Abdomen:     Protuberant, non-tender, bowel sounds active all four quadrants, No G/R/R, no masses, no organomegaly  Genitalia:   Deferred. No concerns/sxs.  Rectal:   Deferred. No concerns/sxs.  Extremities:   Extremities normal, atraumatic, no cyanosis or  gross edema  Pulses:   2+ and symmetric all extremities  Skin:   Warm, dry, Skin color, texture, turgor normal, no obvious rashes or lesions  M-Sk:   Ambulates * 4  w/o difficulty, no gross deformities, tone WNL  Neurologic:   CNII-XII grossly intact, normal strength, sensation and reflexes    Throughout Psych:  No HI/SI, judgement and insight good, Euthymic mood. Full Affect.

## 2020-08-22 NOTE — Patient Instructions (Signed)
Preventive Care 41-44 Years Old, Male Preventive care refers to lifestyle choices and visits with your health care provider that can promote health and wellness. This includes:  A yearly physical exam. This is also called an annual well check.  Regular dental and eye exams.  Immunizations.  Screening for certain conditions.  Healthy lifestyle choices, such as eating a healthy diet, getting regular exercise, not using drugs or products that contain nicotine and tobacco, and limiting alcohol use. What can I expect for my preventive care visit? Physical exam Your health care provider will check:  Height and weight. These may be used to calculate body mass index (BMI), which is a measurement that tells if you are at a healthy weight.  Heart rate and blood pressure.  Your skin for abnormal spots. Counseling Your health care provider may ask you questions about:  Alcohol, tobacco, and drug use.  Emotional well-being.  Home and relationship well-being.  Sexual activity.  Eating habits.  Work and work Statistician. What immunizations do I need?  Influenza (flu) vaccine  This is recommended every year. Tetanus, diphtheria, and pertussis (Tdap) vaccine  You may need a Td booster every 10 years. Varicella (chickenpox) vaccine  You may need this vaccine if you have not already been vaccinated. Zoster (shingles) vaccine  You may need this after age 64. Measles, mumps, and rubella (MMR) vaccine  You may need at least one dose of MMR if you were born in 1957 or later. You may also need a second dose. Pneumococcal conjugate (PCV13) vaccine  You may need this if you have certain conditions and were not previously vaccinated. Pneumococcal polysaccharide (PPSV23) vaccine  You may need one or two doses if you smoke cigarettes or if you have certain conditions. Meningococcal conjugate (MenACWY) vaccine  You may need this if you have certain conditions. Hepatitis A  vaccine  You may need this if you have certain conditions or if you travel or work in places where you may be exposed to hepatitis A. Hepatitis B vaccine  You may need this if you have certain conditions or if you travel or work in places where you may be exposed to hepatitis B. Haemophilus influenzae type b (Hib) vaccine  You may need this if you have certain risk factors. Human papillomavirus (HPV) vaccine  If recommended by your health care provider, you may need three doses over 6 months. You may receive vaccines as individual doses or as more than one vaccine together in one shot (combination vaccines). Talk with your health care provider about the risks and benefits of combination vaccines. What tests do I need? Blood tests  Lipid and cholesterol levels. These may be checked every 5 years, or more frequently if you are over 60 years old.  Hepatitis C test.  Hepatitis B test. Screening  Lung cancer screening. You may have this screening every year starting at age 43 if you have a 30-pack-year history of smoking and currently smoke or have quit within the past 15 years.  Prostate cancer screening. Recommendations will vary depending on your family history and other risks.  Colorectal cancer screening. All adults should have this screening starting at age 72 and continuing until age 2. Your health care provider may recommend screening at age 14 if you are at increased risk. You will have tests every 1-10 years, depending on your results and the type of screening test.  Diabetes screening. This is done by checking your blood sugar (glucose) after you have not eaten  for a while (fasting). You may have this done every 1-3 years.  Sexually transmitted disease (STD) testing. Follow these instructions at home: Eating and drinking  Eat a diet that includes fresh fruits and vegetables, whole grains, lean protein, and low-fat dairy products.  Take vitamin and mineral supplements as  recommended by your health care provider.  Do not drink alcohol if your health care provider tells you not to drink.  If you drink alcohol: ? Limit how much you have to 0-2 drinks a day. ? Be aware of how much alcohol is in your drink. In the [place] one drink equals one 12 oz bottle of beer (355 mL), one 5 oz glass of wine (148 mL), or one 1 oz glass of hard liquor (44 mL). Lifestyle  Take daily care of your teeth and gums.  Stay active. Exercise for at least 30 minutes on 5 or more days each week.  Do not use any products that contain nicotine or tobacco, such as cigarettes, e-cigarettes, and chewing tobacco. If you need help quitting, ask your health care provider.  If you are sexually active, practice safe sex. Use a condom or other form of protection to prevent STIs (sexually transmitted infections).  Talk with your health care provider about taking a low-dose aspirin every day starting at age 53. What's next?  Go to your health care provider once a year for a well check visit.  Ask your health care provider how often you should have your eyes and teeth checked.  Stay up to date on all vaccines. This information is not intended to replace advice given to you by your health care provider. Make sure you discuss any questions you have with your health care provider. Document Revised: 09/25/2018 Document Reviewed: 09/25/2018 Elsevier Patient Education  2020 [employer].

## 2020-08-23 LAB — LIPID PANEL
Chol/HDL Ratio: 5.2 ratio — ABNORMAL HIGH (ref 0.0–5.0)
Cholesterol, Total: 196 mg/dL (ref 100–199)
HDL: 38 mg/dL — ABNORMAL LOW (ref >39)
LDL Chol Calc (NIH): 132 mg/dL — ABNORMAL HIGH (ref 0–99)
Triglycerides: 146 mg/dL (ref 0–149)
VLDL Cholesterol Cal: 26 mg/dL (ref 5–40)

## 2020-08-23 LAB — CBC
Hematocrit: 44.9 % (ref 37.5–51.0)
Hemoglobin: 15.2 g/dL (ref 13.0–17.7)
MCH: 28.4 pg (ref 26.6–33.0)
MCHC: 33.9 g/dL (ref 31.5–35.7)
MCV: 84 fL (ref 79–97)
Platelets: 382 10*3/uL (ref 150–450)
RBC: 5.36 x10E6/uL (ref 4.14–5.80)
RDW: 12.6 % (ref 11.6–15.4)
WBC: 7.7 10*3/uL (ref 3.4–10.8)

## 2020-08-23 LAB — COMPREHENSIVE METABOLIC PANEL
ALT: 34 IU/L (ref 0–44)
AST: 26 IU/L (ref 0–40)
Albumin/Globulin Ratio: 1.6 (ref 1.2–2.2)
Albumin: 4.7 g/dL (ref 4.0–5.0)
Alkaline Phosphatase: 107 IU/L (ref 44–121)
BUN/Creatinine Ratio: 11 (ref 9–20)
BUN: 11 mg/dL (ref 6–24)
Bilirubin Total: 0.3 mg/dL (ref 0.0–1.2)
CO2: 24 mmol/L (ref 20–29)
Calcium: 9.9 mg/dL (ref 8.7–10.2)
Chloride: 98 mmol/L (ref 96–106)
Creatinine, Ser: 1 mg/dL (ref 0.76–1.27)
GFR calc Af Amer: 106 mL/min/{1.73_m2} (ref >59)
GFR calc non Af Amer: 92 mL/min/{1.73_m2} (ref >59)
Globulin, Total: 2.9 g/dL (ref 1.5–4.5)
Glucose: 101 mg/dL — ABNORMAL HIGH (ref 65–99)
Potassium: 4.7 mmol/L (ref 3.5–5.2)
Sodium: 137 mmol/L (ref 134–144)
Total Protein: 7.6 g/dL (ref 6.0–8.5)

## 2020-08-23 LAB — HEMOGLOBIN A1C
Est. average glucose Bld gHb Est-mCnc: 128 mg/dL
Hgb A1c MFr Bld: 6.1 % — ABNORMAL HIGH (ref 4.8–5.6)

## 2020-08-23 LAB — TESTOSTERONE,FREE AND TOTAL
Testosterone, Free: 6.4 pg/mL — ABNORMAL LOW (ref 6.8–21.5)
Testosterone: 92 ng/dL — ABNORMAL LOW (ref 264–916)

## 2020-08-23 LAB — TSH: TSH: 2.54 u[IU]/mL (ref 0.450–4.500)

## 2020-08-24 ENCOUNTER — Encounter: Payer: Self-pay | Admitting: Physician Assistant

## 2020-08-24 ENCOUNTER — Telehealth: Payer: Self-pay | Admitting: Physician Assistant

## 2020-08-24 DIAGNOSIS — E291 Testicular hypofunction: Secondary | ICD-10-CM

## 2020-08-24 MED ORDER — TESTOSTERONE CYPIONATE 200 MG/ML IM SOLN: 100.0000 mg | INTRAMUSCULAR | 0 refills | Status: DC

## 2020-08-24 NOTE — Telephone Encounter (Signed)
Sent rx for testosterone cypionate 100 mg once q 28 days.  Lorrene Reid, PA-C

## 2020-09-06 ENCOUNTER — Encounter: Payer: Self-pay | Admitting: Adult Health

## 2020-09-06 ENCOUNTER — Telehealth: Payer: Self-pay | Admitting: Neurology

## 2020-09-06 ENCOUNTER — Ambulatory Visit: Payer: 59 | Admitting: Adult Health

## 2020-09-06 ENCOUNTER — Telehealth: Payer: Self-pay | Admitting: Adult Health

## 2020-09-06 VITALS — BP 127/85 | HR 81 | Ht 73.0 in | Wt 308.4 lb

## 2020-09-06 DIAGNOSIS — G4733 Obstructive sleep apnea (adult) (pediatric): Secondary | ICD-10-CM

## 2020-09-06 DIAGNOSIS — Z9989 Dependence on other enabling machines and devices: Secondary | ICD-10-CM | POA: Diagnosis not present

## 2020-09-06 NOTE — Patient Instructions (Addendum)
Continue using CPAP nightly and greater than 4 hours each night °If your symptoms worsen or you develop new symptoms please let us know.  ° °

## 2020-09-06 NOTE — Telephone Encounter (Signed)
Spoke with [facility] verbally. She reviewed the initial CPAP download information from earlier this year. Patient was only at 64% with very spotty usage. She recommends that he attempts to use the machine for a solid 3 months, including with travel, to determine if he is truly not benefiting from the machine.   Called patient but he did not answer. Left a message for him to call back.

## 2020-09-06 NOTE — Telephone Encounter (Signed)
Pt returned phone call.  

## 2020-09-06 NOTE — Telephone Encounter (Signed)
error 

## 2020-09-06 NOTE — Progress Notes (Addendum)
PATIENT: Philip Daugherty DOB: 10/28/1975  REASON FOR VISIT: follow up HISTORY FROM: patient  HISTORY OF PRESENT ILLNESS: Today 09/06/20:  Philip Daugherty is a 44 year old male with a history of obstructive sleep apnea on CPAP.  He returns today for follow-up.  The patient reports that he got his machine in May.  States that he try to be consistent with it for the 1st month but he recently has not been using it as often.  I have a download for 90 days start Feb 20, 2020 to May 19, 2020.  He uses machine 58 out of 90 days for compliance of 64%.  He uses machine greater than 4 hours 55 days for compliance of 61%.  On average he uses his machine 9 hours and 15 minutes.  His residual AHI is 1.7 on 7 to 13 cm of water with EPR 3.  Leak in the 95th percentile is 30.8 L/min.  Patient reports that he has tried several different mask sizes and styles.  He reports that he also travels frequently for his job and finds it difficult to take the machine with him as it does not fit in his luggage.  He also states that he has not noticed much benefit since he started using it.  He continues to struggle with daytime sleepiness.  Reports that he is fatigued throughout the day.  Reports that even when he was in better physical shape he still felt tired throughout the day.  He is currently on Adderall and Vyvanse--not prescribed by our office.  He returns today for an evaluation.  HISTORY Philip Daugherty is a 44 year old right-handed gentleman with an underlying medical history of prediabetes, palpitations, depression, asthma, allergies, history of pancreatitis, history of syncope and morbid obesity with a BMI of over 40, who reports snoring and excessive daytime somnolence.  His Epworth sleepiness score is 12 out of 24 today, fatigue severity score is 61 out of 63.  Of note, his blood pressure is quite elevated today, he is not on any antihypertensive medication. I reviewed your virtual visit note from 09/17/2019.  He has a  history of ADD.  He has had difficulty initiating and maintaining sleep at times.  He has taken an over-the-counter medication that contains melatonin and tryptophan.  He used to be on alprazolam for years and reports that he has some leftover pills that he takes it primarily for fear of flight but sometimes for sleep.  He feels exhausted all the time, does not wake up rested, is a restless sleeper but denies any telltale symptoms of restless leg syndrome.  He suspects that his father may have sleep apnea but no one has been officially diagnosed with OSA in his family.  He has nocturia about once or twice per average night and denies recurrent morning headaches.  He is married and lives with his family including wife and 2 young children, ages 72 and 9.  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  He has gained weight in the recent past.  He drinks caffeine in the form of coffee, 3 to 4 cups/day, soda occasionally, typically no tea.  He is a non-smoker and drinks alcohol on a fairly regular basis, up to 10-14 drinks per week, typically in the form of beer or whiskey.  He goes to bed typically before 10 and rise time is between 7 and 7:30 AM.  He has no TV in the bedroom.  They have 2  dogs in the household, 1 small dog typically sleeps on the bed with them.  Cannot sleep in their own bedrooms.  He has been on Adderall for ADD per primary care physician.   REVIEW OF SYSTEMS: Out of a complete 14 system review of symptoms, the patient complains only of the following symptoms, and all other reviewed systems are negative.  FSS 49 ESS 8  ALLERGIES: No Known Allergies  HOME MEDICATIONS: Outpatient Medications Prior to Visit  Medication Sig Dispense Refill   ADDERALL XR 30 MG 24 hr capsule TAKE 1 CAPSULE BY MOUTH EVERY MORNING. 30 capsule 0   buPROPion (WELLBUTRIN XL) 150 MG 24 hr tablet Take 1 tablet (150 mg total) by mouth every morning. 90 tablet 0   DULoxetine  (CYMBALTA) 60 MG capsule Take 1 capsule (60 mg total) by mouth daily. 90 capsule 0   lisdexamfetamine (VYVANSE) 10 MG capsule TAKE 1 CAPSULE BY MOUTH IN THE AFTERNOON. 30 capsule 0   testosterone cypionate (DEPOTESTOSTERONE CYPIONATE) 200 MG/ML injection Inject 0.5 mLs (100 mg total) into the muscle every 28 (twenty-eight) days. 10 mL 0   levocetirizine (XYZAL) 5 MG tablet Take 5 mg by mouth 2 (two) times daily. (Patient not taking: Reported on 08/22/2020)     No facility-administered medications prior to visit.    PAST MEDICAL HISTORY: Past Medical History:  Diagnosis Date   Acute pancreatitis 05/17/2016   Admitted to hosp while out of town in [place].  Likely secondary to ETOH.  GB/stone w/u NEG.   Allergy to cats    takes claritin   Asthma    childhood   Depression    wellbutrin helped in the past; pt cites failure to respond to prozac, zoloft, and celexa   Fear of flying    +insomnia; was on xanax regulary for long period and then abruptly stopped it and had w/drawal seizures.   Hypogonadism male 2012 and 2016   Managed by Dr. Loanne Daugherty ([facility]) as of 04/2017, but this was only brief.  I restarted pt on testos IM at end of Oct 2019.   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 Era Daugherty)   Obesity, morbid, BMI 40.0-49.9 (Philip Daugherty)    Palpitations    24h Holter.: sinus tachycardia   Prediabetes 01/2017   A1c 6.3% ; 6.1% Dec 2018.   Syncope due to orthostatic hypotension    d/c'd from [employer] due to this.  Says if Wt 235 lbs or more then this is not a problem    PAST SURGICAL HISTORY: Past Surgical History:  Procedure Laterality Date   EYE SURGERY Bilateral    lasik    FAMILY HISTORY: Family History  Problem Relation Age of Onset   Depression Mother        bipolar disorder   Parkinsonism Mother    Diabetes Father    Other Father        low testosterone    SOCIAL HISTORY: Social History   Socioeconomic History    Marital status: Married    Spouse name: Not on file   Number of children: Not on file   Years of education: Not on file   Highest education level: Not on file  Occupational History   Not on file  Tobacco Use   Smoking status: Never Smoker   Smokeless tobacco: Never Used  Substance and Sexual Activity   Alcohol use: Yes    Comment: has decreased since 01/04/12   Drug use: No  Sexual activity: Not on file  Other Topics Concern   Not on file  Social History Narrative   Married, second marriage.  No children.   Orig from [place], [place] time in [place], [place]   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   No tobacco.  Alcohol: 3-4 bourbon drinks most days, cut back as of 12/2011.   Exercise: sporadic (elliptical, walking).  No hx of drug use/abuse.      Social Determinants of Health   Financial Resource Strain:    Difficulty of Paying Living Expenses: Not on file  Food Insecurity:    Worried About Charity fundraiser in the Last Year: Not on file   [employer] of Food in the Last Year: Not on file  Transportation Needs:    Lack of Transportation (Medical): Not on file   Lack of Transportation (Non-Medical): Not on file  Physical Activity:    Days of Exercise per Week: Not on file   Minutes of Exercise per Session: Not on file  Stress:    Feeling of Stress : Not on file  Social Connections:    Frequency of Communication with Friends and Family: Not on file   Frequency of Social Gatherings with Friends and Family: Not on file   Attends Religious Services: Not on file   Active Member of Clubs or Organizations: Not on file   Attends Archivist Meetings: Not on file   Marital Status: Not on file  Intimate Partner Violence:    Fear of Current or Ex-Partner: Not on file   Emotionally Abused: Not on file    Physically Abused: Not on file   Sexually Abused: Not on file      PHYSICAL EXAM  Vitals:   09/06/20 0801  BP: 127/85  Pulse: 81  Weight: (!) 308 lb 6.4 oz (139.9 kg)  Height: 6\' 1"  (1.854 m)   Body mass index is 40.69 kg/m.  Generalized: Well developed, in no acute distress  Chest: Lungs clear to auscultation bilaterally  Neurological examination  Mentation: Alert oriented to time, place, history taking. Follows all commands speech and language fluent Cranial nerve II-XII: Extraocular movements were full, visual field were full on confrontational test Head turning and shoulder shrug  were normal and symmetric. Motor: The motor testing reveals 5 over 5 strength of all 4 extremities. Good symmetric motor tone is noted throughout.  Sensory: Sensory testing is intact to soft touch on all 4 extremities. No evidence of extinction is noted.  Gait and station: Gait is normal.    DIAGNOSTIC DATA (LABS, IMAGING, TESTING) - I reviewed patient records, labs, notes, testing and imaging myself where available.  Lab Results  Component Value Date   WBC 7.7 08/22/2020   HGB 15.2 08/22/2020   HCT 44.9 08/22/2020   MCV 84 08/22/2020   PLT 382 08/22/2020      Component Value Date/Time   NA 137 08/22/2020 0938   K 4.7 08/22/2020 0938   CL 98 08/22/2020 0938   CO2 24 08/22/2020 0938   GLUCOSE 101 (H) 08/22/2020 0938   GLUCOSE 88 10/01/2017 1156   BUN 11 08/22/2020 0938   CREATININE 1.00 08/22/2020 0938   CALCIUM 9.9 08/22/2020 0938   PROT 7.6 08/22/2020 0938   ALBUMIN 4.7 08/22/2020 0938   AST 26 08/22/2020 0938   ALT 34 08/22/2020 0938   ALKPHOS  107 08/22/2020 0938   BILITOT 0.3 08/22/2020 0938   GFRNONAA 92 08/22/2020 0938   GFRAA 106 08/22/2020 0938   Lab Results  Component Value Date   CHOL 196 08/22/2020   HDL 38 (L) 08/22/2020   LDLCALC 132 (H) 08/22/2020   TRIG 146 08/22/2020   CHOLHDL 5.2 (H) 08/22/2020   Lab Results  Component Value Date   HGBA1C 6.1 (H)  08/22/2020   No results found for: [id number] Lab Results  Component Value Date   TSH 2.540 08/22/2020      ASSESSMENT AND PLAN 44 y.o. year old male  has a past medical history of Acute pancreatitis (05/17/2016), Allergy to cats, Asthma, Depression, Fear of flying, Hypogonadism male (2012 and 2016), Male infertility (2012/2013), Obesity, morbid, BMI 40.0-49.9 ([place]), Palpitations, Prediabetes (01/2017), and Syncope due to orthostatic hypotension. here with:  1. OSA on CPAP  - CPAP compliance is suboptimal explained to the patient that he should try to be consistent with CPAP usage in order to see the benefit.  Patient voiced understanding.  I did advise that if he is consistent with using his CPAP for the next 2 to 3 months and is still experiencing significant daytime sleepiness or fatigue we can consider further testing. - Good treatment of AHI when he is using the machine - Encourage patient to use CPAP nightly and > 4 hours each night - F/U in 3 months or sooner if needed   I spent 30 minutes of face-to-face and non-face-to-face time with patient.  This included previsit chart review, lab review, study review, order entry, electronic health record documentation, patient education.  Ward Givens, MSN, NP-C 09/06/2020, 8:09 AM [facility] [address] [place], [place] [address] [phone]   I reviewed the above note and documentation by the Nurse Practitioner and agree with the history, exam, assessment and plan as outlined above. I was available for consultation. Star Age, MD, PhD  [facility])

## 2020-09-07 NOTE — Telephone Encounter (Signed)
Left message for patient to call back  

## 2020-09-12 ENCOUNTER — Other Ambulatory Visit: Payer: Self-pay | Admitting: Physician Assistant

## 2020-09-12 DIAGNOSIS — F988 Other specified behavioral and emotional disorders with onset usually occurring in childhood and adolescence: Secondary | ICD-10-CM

## 2020-09-12 DIAGNOSIS — F411 Generalized anxiety disorder: Secondary | ICD-10-CM

## 2020-09-12 DIAGNOSIS — F322 Major depressive disorder, single episode, severe without psychotic features: Secondary | ICD-10-CM

## 2020-09-12 NOTE — Telephone Encounter (Signed)
Patient last apt 08/2020 advised to follow up in 3 months.   Last refill of Adderall/Vyvanse given in October. Please approve if deemed appropriate. AS, CMA

## 2020-10-12 ENCOUNTER — Other Ambulatory Visit: Payer: Self-pay | Admitting: Physician Assistant

## 2020-10-12 DIAGNOSIS — F988 Other specified behavioral and emotional disorders with onset usually occurring in childhood and adolescence: Secondary | ICD-10-CM

## 2020-10-12 NOTE — Telephone Encounter (Signed)
Patient last seen 08/22/20 and advised to follow up in 3 months.   Last refill adderall given 09/12/20 #30 no refill  Lasat refill vyvanse given 09/12/20 #30 no refill  Please approve if refill deemed appropriate. AS, CMA

## 2020-11-17 ENCOUNTER — Other Ambulatory Visit: Payer: Self-pay | Admitting: Physician Assistant

## 2020-11-17 DIAGNOSIS — F988 Other specified behavioral and emotional disorders with onset usually occurring in childhood and adolescence: Secondary | ICD-10-CM

## 2020-11-17 NOTE — Telephone Encounter (Signed)
Patient last seen 08/22/20 and was advised to follow up in 3 months. Patient has apt scheduled for 11/22/20.   Last refill given 10/18/20 #30 no refill for both.   AS, CMA

## 2020-11-22 ENCOUNTER — Ambulatory Visit: Payer: 59 | Admitting: Physician Assistant

## 2020-11-22 ENCOUNTER — Encounter: Payer: Self-pay | Admitting: Physician Assistant

## 2020-11-22 ENCOUNTER — Other Ambulatory Visit: Payer: Self-pay

## 2020-11-22 VITALS — BP 127/81 | HR 97 | Temp 98.9°F | Ht 73.0 in | Wt 326.1 lb

## 2020-11-22 DIAGNOSIS — E291 Testicular hypofunction: Secondary | ICD-10-CM

## 2020-11-22 DIAGNOSIS — F331 Major depressive disorder, recurrent, moderate: Secondary | ICD-10-CM | POA: Diagnosis not present

## 2020-11-22 DIAGNOSIS — R195 Other fecal abnormalities: Secondary | ICD-10-CM

## 2020-11-22 DIAGNOSIS — G4733 Obstructive sleep apnea (adult) (pediatric): Secondary | ICD-10-CM

## 2020-11-22 DIAGNOSIS — F988 Other specified behavioral and emotional disorders with onset usually occurring in childhood and adolescence: Secondary | ICD-10-CM | POA: Diagnosis not present

## 2020-11-22 MED ORDER — METHYLPHENIDATE HCL 10 MG PO TABS: 10.0000 mg | ORAL_TABLET | Freq: Two times a day (BID) | ORAL | 0 refills | Status: DC

## 2020-11-22 NOTE — Progress Notes (Signed)
Established Patient Office Visit  Subjective:  Patient ID: Philip Daugherty, male    DOB: 1976/06/15  Age: 45 y.o. MRN: [id number]  CC:  Chief Complaint  Patient presents with  . ADHD    HPI Philip Daugherty presents for follow up on ADD and mood management.  Reports noticing loose stools for several months.  Denies nausea, vomiting, abdominal pain, fever, significant stress, dietary changes, unintentional weight loss, or starting new supplements.  ADD: Reports is taking Adderall XR and Vyvanse both in the morning. Tried to take Vyvanse in the afternoon as instructed but would have trouble remembering taking the medication and missed dose. Even with taking Vyvanse and Adderall XR in the morning, starts noticing medications wearing off around 11:00 am. Usually takes medications around 7:30 am. Has been on Adderall XR for several years.  In the past was switched from Adderall XR to Vyvanse which did not work well.  Has not tried other medications such as Ritalin.  Mood: Was started on Wellbutrin and Cymbalta for depression.  Reports medications have helped with his mood and has no concerns.  Testosterone therapy: Patient reports his wife has been administering his monthly injections.  Continues to feel tired and feels dose is probably low.  OSA: States tried to wear CPAP but just is unable too. Was not able to get used to the machine.  Has upcoming appointment with neurology.  Continues to have daytime sleepiness and fatigue.  Past Medical History:  Diagnosis Date  . Acute pancreatitis 05/17/2016   Admitted to hosp while out of town in [place].  Likely secondary to ETOH.  GB/stone w/u NEG.  . Allergy to cats    takes claritin  . Asthma    childhood  . Depression    wellbutrin helped in the past; pt cites failure to respond to prozac, zoloft, and celexa  . Fear of flying    +insomnia; was on xanax regulary for long period and then abruptly stopped it and had w/drawal seizures.  .  Hypogonadism male 2012 and 2016   Managed by Dr. Loanne Drilling ([facility]) as of 04/2017, but this was only brief.  I restarted pt on testos IM at end of Oct 2019.  . 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 Era Bumpers)  . Obesity, morbid, BMI 40.0-49.9 ([place])   . Palpitations    24h Holter.: sinus tachycardia  . Prediabetes 01/2017   A1c 6.3% ; 6.1% Dec 2018.  Marland Kitchen Syncope due to orthostatic hypotension    d/c'd from [employer] due to this.  Says if Wt 235 lbs or more then this is not a problem    Past Surgical History:  Procedure Laterality Date  . EYE SURGERY Bilateral    lasik    Family History  Problem Relation Age of Onset  . Depression Mother        bipolar disorder  . Parkinsonism Mother   . Diabetes Father   . Other Father        low testosterone    Social History   Socioeconomic History  . Marital status: Married    Spouse name: Not on file  . Number of children: Not on file  . Years of education: Not on file  . Highest education level: Not on file  Occupational History  . Not on file  Tobacco Use  . Smoking status: Never Smoker  . Smokeless tobacco: Never Used  Substance and Sexual Activity  . Alcohol  use: Yes    Comment: has decreased since 01/04/12  . Drug use: No  . Sexual activity: Not on file  Other Topics Concern  . Not on file  Social History Narrative   Married, second marriage.  No children.   Orig from [place], spent time in [place], college in [place].   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   No tobacco.  Alcohol: 3-4 bourbon drinks most days, cut back as of 12/2011.   Exercise: sporadic (elliptical, walking).  No hx of drug use/abuse.      Social Determinants of Health   Financial Resource Strain: Not on file  Food Insecurity: Not on file  Transportation Needs: Not on file   Physical Activity: Not on file  Stress: Not on file  Social Connections: Not on file  Intimate Partner Violence: Not on file    Outpatient Medications Prior to Visit  Medication Sig Dispense Refill  . buPROPion (WELLBUTRIN XL) 150 MG 24 hr tablet Take 1 tablet (150 mg total) by mouth every morning. 90 tablet 0  . DULoxetine (CYMBALTA) 60 MG capsule Take 1 capsule (60 mg total) by mouth daily. 90 capsule 0  . lisdexamfetamine (VYVANSE) 10 MG capsule TAKE 1 CAPSULE BY MOUTH IN THE AFTERNOON. 30 capsule 0  . testosterone cypionate (DEPOTESTOSTERONE CYPIONATE) 200 MG/ML injection Inject 0.5 mLs (100 mg total) into the muscle every 28 (twenty-eight) days. 10 mL 0  . ADDERALL XR 30 MG 24 hr capsule TAKE 1 CAPSULE BY MOUTH EVERY MORNING. 30 capsule 0   No facility-administered medications prior to visit.    No Known Allergies  ROS Review of Systems A fourteen system review of systems was performed and found to be positive as per HPI.  Objective:    Physical Exam General:  Well Developed, well nourished, in no acute distress  Neuro:  Alert and oriented,  extra-ocular muscles intact  HEENT:  Normocephalic, atraumatic, neck supple Skin:  no gross rash, warm, pink. Cardiac:  RRR, S1 S2 wnl's Respiratory:  ECTA B/L w/o wheezing, Not using accessory muscles, speaking in full sentences- unlabored. Vascular:  Ext warm, no cyanosis apprec.; no gross edema Psych:  No HI/SI, judgement and insight good, Euthymic mood. Full Affect.   BP 127/81   Pulse 97   Temp 98.9 F (37.2 C)   Ht 6\' 1"  (1.854 m)   Wt (!) 326 lb 1.6 oz (147.9 kg)   SpO2 97%   BMI 43.02 kg/m  Wt Readings from Last 3 Encounters:  11/22/20 (!) 326 lb 1.6 oz (147.9 kg)  09/06/20 (!) 308 lb 6.4 oz (139.9 kg)  08/22/20 (!) 309 lb (140.2 kg)     Health Maintenance Due  Topic Date Due  . Hepatitis C Screening  Never done  . COVID-19 Vaccine (1) Never done    There are no preventive care reminders to display for this  patient.  Lab Results  Component Value Date   TSH 2.540 08/22/2020   Lab Results  Component Value Date   WBC 7.7 08/22/2020   HGB 15.2 08/22/2020   HCT 44.9 08/22/2020   MCV 84 08/22/2020   PLT 382 08/22/2020   Lab Results  Component Value Date   NA 137 08/22/2020   K 4.7 08/22/2020   CO2 24 08/22/2020   GLUCOSE 101 (H) 08/22/2020   BUN 11 08/22/2020   CREATININE 1.00 08/22/2020  BILITOT 0.3 08/22/2020   ALKPHOS 107 08/22/2020   AST 26 08/22/2020   ALT 34 08/22/2020   PROT 7.6 08/22/2020   ALBUMIN 4.7 08/22/2020   CALCIUM 9.9 08/22/2020   GFR 101.42 10/01/2017   Lab Results  Component Value Date   CHOL 196 08/22/2020   Lab Results  Component Value Date   HDL 38 (L) 08/22/2020   Lab Results  Component Value Date   LDLCALC 132 (H) 08/22/2020   Lab Results  Component Value Date   TRIG 146 08/22/2020   Lab Results  Component Value Date   CHOLHDL 5.2 (H) 08/22/2020   Lab Results  Component Value Date   HGBA1C 6.1 (H) 08/22/2020      Assessment & Plan:   Problem List Items Addressed This Visit      Endocrine   Hypogonadism, male     Other   Major depressive disorder, recurrent episode, moderate (HCC)   Attention deficit disorder (ADD) without hyperactivity - Primary    Other Visit Diagnoses    OSA (obstructive sleep apnea)       Loose stools         Attention deficit disorder without hyperactivity: -Surprisingly,  patient is taking both long-acting agents at the same time in the morning which last <4 hours. Discussed with patient changing Adderall XR and is agreeable to start short acting methylphenidate. Will continue Vyvanse. Recommend to follow up with Neurology as scheduled. Patient unable to tolerate CPAP and treatment of sleep apnea is important with stimulant therapy. Recommend further evaluation of daytime sleepiness, especially if underlying condition is contributing to symptoms such as narcolepsy (patient might benefit from modafinil  which could potentially help with ADD). -Advised patient to let me know if Methylphenidate 10 mg ineffective and will adjust dose.  -Follow up in 3 months to reassess medication therapy.   Major depressive disorder, recurrent episode, moderate: -PHQ-2 score of 0, PHQ-9 score of 7 (stable). -Continue current medication regimen.  -Will continue to monitor.  OSA: -Follow up with Neurology as scheduled. Recommend to discuss alternative treatment options.  Hypogonadism, male: -Last total testosterone 92, free testosterone 6.4 -On testosterone cypionate 100 mg q28 days. Advised to schedule lab visit to repeat testosterone level and adjust treatment therapy.   Loose stools: -Discussed with patient potential etiologies including gastroenterology or medication side effect. -Recommend to continue to monitor symptoms and if they fail to improve or worsen recommend further evaluation.  Meds ordered this encounter  Medications  . methylphenidate (RITALIN) 10 MG tablet    Sig: Take 1 tablet (10 mg total) by mouth 2 (two) times daily.    Dispense:  60 tablet    Refill:  0    Order Specific Question:   Supervising Provider    Answer:   Beatrice Lecher D [[id number]]    Follow-up: Return in about 3 months (around 02/19/2021) for ADHD with labs this wk for testosterone recheck (and PSA).   Note:  This note was prepared with assistance of Dragon voice recognition software. Occasional wrong-word or sound-a-like substitutions may have occurred due to the inherent limitations of voice recognition software.   Lorrene Reid, PA-C

## 2020-11-22 NOTE — Patient Instructions (Signed)
Testosterone injection What is this medicine? TESTOSTERONE (tes TOS ter one) is the main male hormone. It supports normal male development such as muscle growth, facial hair, and deep voice. It is used in males to treat low testosterone levels. This medicine may be used for other purposes; ask your health care provider or pharmacist if you have questions. COMMON BRAND NAME(S): Andro-L.A., Aveed, Delatestryl, Depo-Testosterone, Virilon What should I tell my health care provider before I take this medicine? They need to know if you have any of these conditions:  cancer  diabetes  heart disease  kidney disease  liver disease  lung disease  prostate disease  an unusual or allergic reaction to testosterone, other medicines, foods, dyes, or preservatives  pregnant or trying to get pregnant  breast-feeding How should I use this medicine? This medicine is for injection into a muscle. It is usually given by a health care professional in a hospital or clinic setting. Contact your pediatrician regarding the use of this medicine in children. While this medicine may be prescribed for children as young as 54 years of age for selected conditions, precautions do apply. Overdosage: If you think you have taken too much of this medicine contact a poison control center or emergency room at once. NOTE: This medicine is only for you. Do not share this medicine with others. What if I miss a dose? Try not to miss a dose. Your doctor or health care professional will tell you when your next injection is due. Notify the office if you are unable to keep an appointment. What may interact with this medicine?  medicines for diabetes  medicines that treat or prevent blood clots like warfarin  oxyphenbutazone  propranolol  steroid medicines like prednisone or cortisone This list may not describe all possible interactions. Give your health care provider a list of all the medicines, herbs, non-prescription  drugs, or dietary supplements you use. Also tell them if you smoke, drink alcohol, or use illegal drugs. Some items may interact with your medicine. What should I watch for while using this medicine? Visit your doctor or health care professional for regular checks on your progress. They will need to check the level of testosterone in your blood. This medicine is only approved for use in men who have low levels of testosterone related to certain medical conditions. Heart attacks and strokes have been reported with the use of this medicine. Notify your doctor or health care professional and seek emergency treatment if you develop breathing problems; changes in vision; confusion; chest pain or chest tightness; sudden arm pain; severe, sudden headache; trouble speaking or understanding; sudden numbness or weakness of the face, arm or leg; loss of balance or coordination. Talk to your doctor about the risks and benefits of this medicine. This medicine may affect blood sugar levels. If you have diabetes, check with your doctor or health care professional before you change your diet or the dose of your diabetic medicine. Testosterone injections are not commonly used in women. Women should inform their doctor if they wish to become pregnant or think they might be pregnant. There is a potential for serious side effects to an unborn child. Talk to your health care professional or pharmacist for more information. Talk with your doctor or health care professional about your birth control options while taking this medicine. This drug is banned from use in athletes by most athletic organizations. What side effects may I notice from receiving this medicine? Side effects that you should report to  your doctor or health care professional as soon as possible:  allergic reactions like skin rash, itching or hives, swelling of the face, lips, or tongue  breast enlargement  breathing problems  changes in emotions or  moods  deep or hoarse voice  irregular menstrual periods  signs and symptoms of liver injury like dark yellow or brown urine; general ill feeling or flu-like symptoms; light-colored stools; loss of appetite; nausea; right upper belly pain; unusually weak or tired; yellowing of the eyes or skin  stomach pain  swelling of the ankles, feet, hands  too frequent or persistent erections  trouble passing urine or change in the amount of urine Side effects that usually do not require medical attention (report to your doctor or health care professional if they continue or are bothersome):  acne  change in sex drive or performance  facial hair growth  hair loss  headache This list may not describe all possible side effects. Call your doctor for medical advice about side effects. You may report side effects to FDA at 1-800-FDA-1088. Where should I keep my medicine? Keep out of the reach of children. This medicine can be abused. Keep your medicine in a safe place to protect it from theft. Do not share this medicine with anyone. Selling or giving away this medicine is dangerous and against the law. Store at room temperature between 20 and 25 degrees C (68 and 77 degrees F). Do not freeze. Protect from light. Follow the directions for the product you are prescribed. Throw away any unused medicine after the expiration date. NOTE: This sheet is a summary. It may not cover all possible information. If you have questions about this medicine, talk to your doctor, pharmacist, or health care provider.  2021 Elsevier/Gold Standard (2015-11-05 07:33:55)

## 2020-11-24 ENCOUNTER — Other Ambulatory Visit: Payer: Self-pay

## 2020-11-24 ENCOUNTER — Other Ambulatory Visit: Payer: 59

## 2020-11-24 DIAGNOSIS — E291 Testicular hypofunction: Secondary | ICD-10-CM

## 2020-11-25 ENCOUNTER — Encounter: Payer: Self-pay | Admitting: Physician Assistant

## 2020-11-25 DIAGNOSIS — E291 Testicular hypofunction: Secondary | ICD-10-CM

## 2020-11-25 LAB — TESTOSTERONE: Testosterone: 89 ng/dL — ABNORMAL LOW (ref 264–916)

## 2020-11-25 MED ORDER — TESTOSTERONE CYPIONATE 200 MG/ML IM SOLN: 200.0000 mg | INTRAMUSCULAR | 0 refills | Status: DC

## 2020-12-15 ENCOUNTER — Telehealth: Payer: Self-pay

## 2020-12-15 NOTE — Telephone Encounter (Signed)
LVM  Pt not using cpap

## 2020-12-19 ENCOUNTER — Ambulatory Visit: Payer: 59 | Admitting: Adult Health

## 2020-12-19 ENCOUNTER — Telehealth: Payer: Self-pay | Admitting: Adult Health

## 2020-12-19 NOTE — Telephone Encounter (Signed)
FYI: Pt called to cancel appt for today (12/19/20. Informed Pt there would be a no show fee of $50. I Philip Daugherty) can transfer you to Billing. Pt stated, do not have time for that, will call back later to take care of that.

## 2020-12-19 NOTE — Telephone Encounter (Signed)
Noted  

## 2020-12-22 ENCOUNTER — Telehealth: Payer: Self-pay | Admitting: Physician Assistant

## 2020-12-22 NOTE — Telephone Encounter (Signed)
Philip Daugherty is faxing a medical record request- provided [facility] [phone]. thak you

## 2020-12-23 ENCOUNTER — Other Ambulatory Visit: Payer: Self-pay | Admitting: Physician Assistant

## 2020-12-23 DIAGNOSIS — F411 Generalized anxiety disorder: Secondary | ICD-10-CM

## 2020-12-23 DIAGNOSIS — F988 Other specified behavioral and emotional disorders with onset usually occurring in childhood and adolescence: Secondary | ICD-10-CM

## 2020-12-23 DIAGNOSIS — F322 Major depressive disorder, single episode, severe without psychotic features: Secondary | ICD-10-CM

## 2020-12-23 NOTE — Telephone Encounter (Signed)
Patient last seen 11/22/20 and advised to follow up in 3 months.   Last refill for Vyvanse given 11/17/20 #30 nrf Last refill for Ritalin given 11/22/2020 #60 nrf

## 2021-01-08 ENCOUNTER — Encounter: Payer: Self-pay | Admitting: Family

## 2021-01-08 ENCOUNTER — Telehealth: Payer: 59 | Admitting: Family

## 2021-01-08 DIAGNOSIS — U071 COVID-19: Secondary | ICD-10-CM | POA: Diagnosis not present

## 2021-01-08 MED ORDER — DEXAMETHASONE 6 MG PO TABS: 6.0000 mg | ORAL_TABLET | Freq: Two times a day (BID) | ORAL | 0 refills | Status: DC

## 2021-01-08 MED ORDER — ALBUTEROL SULFATE HFA 108 (90 BASE) MCG/ACT IN AERS: 2.0000 | INHALATION_SPRAY | Freq: Four times a day (QID) | RESPIRATORY_TRACT | 0 refills | Status: DC | PRN

## 2021-01-08 NOTE — Progress Notes (Signed)
   Virtual Visit  Note Due to COVID-19 pandemic this visit was conducted virtually. This visit type was conducted due to national recommendations for restrictions regarding the COVID-19 Pandemic (e.g. social distancing, sheltering in place) in an effort to limit this patient's exposure and mitigate transmission in our community. All issues noted in this document were discussed and addressed.  A physical exam was not performed with this format.  I connected with [name] on 01/08/21 at 2:16 pm  by video and verified that I am speaking with the correct person using two identifiers. Philip Daugherty is currently located at home and wife is currently with him  during visit. The provider, Evelina Dun, FNP is located in their office at time of visit.  I discussed the limitations, risks, security and privacy concerns of performing an evaluation and management service by video  and the availability of in person appointments. I also discussed with the patient that there may be a patient responsible charge related to this service. The patient expressed understanding and agreed to proceed.   History and Present Illness:  HPI  Pt presents with symptoms of chest tightness, SOB, coughing. He took an at home COVID test that was positive 01/04/21. He reports his symptoms started on 01/03/21.  States he is afebrile since yesterday.   He reports he had asthmas as a child, that is usually well controlled except during seasonal changes.   He has taken tylenol  And benadryl.    Review of Systems  Constitutional: Positive for malaise/fatigue.  Respiratory: Positive for cough and wheezing.   All other systems reviewed and are negative.    Observations/Objective: No SOB or distress noted   Assessment and Plan: 1. COVID-19 virus detected COVID positive, rest, force fluids, tylenol as needed, Quarantine for at least 5 days and fever free, report any worsening symptoms such as increased shortness of  breath, swelling, or continued high fevers.  Avoid caffeine  - MyChart COVID-19 home monitoring program; Future - albuterol (VENTOLIN HFA) 108 (90 Base) MCG/ACT inhaler; Inhale 2 puffs into the lungs every 6 (six) hours as needed for wheezing or shortness of breath.  Dispense: 8 g; Refill: 0 - dexamethasone (DECADRON) 6 MG tablet; Take 1 tablet (6 mg total) by mouth 2 (two) times daily.  Dispense: 14 tablet; Refill: 0      I discussed the assessment and treatment plan with the patient. The patient was provided an opportunity to ask questions and all were answered. The patient agreed with the plan and demonstrated an understanding of the instructions.   The patient was advised to call back or seek an in-person evaluation if the symptoms worsen or if the condition fails to improve as anticipated.  The above assessment and management plan was discussed with the patient. The patient verbalized understanding of and has agreed to the management plan. Patient is aware to call the clinic if symptoms persist or worsen. Patient is aware when to return to the clinic for a follow-up visit. Patient educated on when it is appropriate to go to the emergency department.   Time call ended:  2:27 pm   I provided 11 minutes of   face-to-face time during this encounter.    Evelina Dun, FNP

## 2021-01-10 ENCOUNTER — Encounter (INDEPENDENT_AMBULATORY_CARE_PROVIDER_SITE_OTHER): Payer: Self-pay

## 2021-01-10 ENCOUNTER — Emergency Department (HOSPITAL_COMMUNITY): Payer: 59

## 2021-01-10 ENCOUNTER — Other Ambulatory Visit: Payer: Self-pay

## 2021-01-10 ENCOUNTER — Telehealth: Payer: Self-pay

## 2021-01-10 ENCOUNTER — Emergency Department (HOSPITAL_COMMUNITY)
Admission: EM | Admit: 2021-01-10 | Discharge: 2021-01-10 | Disposition: A | Discharge status: Home / Self Care | Payer: 59 | Attending: Emergency Medicine | Admitting: Emergency Medicine

## 2021-01-10 ENCOUNTER — Encounter (HOSPITAL_COMMUNITY): Payer: Self-pay

## 2021-01-10 DIAGNOSIS — J45909 Unspecified asthma, uncomplicated: Secondary | ICD-10-CM | POA: Insufficient documentation

## 2021-01-10 DIAGNOSIS — Y900 Blood alcohol level of less than 20 mg/100 ml: Secondary | ICD-10-CM | POA: Insufficient documentation

## 2021-01-10 DIAGNOSIS — U071 COVID-19: Secondary | ICD-10-CM | POA: Diagnosis not present

## 2021-01-10 DIAGNOSIS — R531 Weakness: Secondary | ICD-10-CM | POA: Insufficient documentation

## 2021-01-10 DIAGNOSIS — F1099 Alcohol use, unspecified with unspecified alcohol-induced disorder: Secondary | ICD-10-CM | POA: Insufficient documentation

## 2021-01-10 DIAGNOSIS — R0602 Shortness of breath: Secondary | ICD-10-CM | POA: Diagnosis present

## 2021-01-10 DIAGNOSIS — R410 Disorientation, unspecified: Secondary | ICD-10-CM

## 2021-01-10 LAB — CBC WITH DIFFERENTIAL/PLATELET
Abs Immature Granulocytes: 0.18 10*3/uL — ABNORMAL HIGH (ref 0.00–0.07)
Basophils Absolute: 0 10*3/uL (ref 0.0–0.1)
Basophils Relative: 0 %
Eosinophils Absolute: 0 10*3/uL (ref 0.0–0.5)
Eosinophils Relative: 0 %
HCT: 46.7 % (ref 39.0–52.0)
Hemoglobin: 15.1 g/dL (ref 13.0–17.0)
Immature Granulocytes: 1 %
Lymphocytes Relative: 5 %
Lymphs Abs: 0.8 10*3/uL (ref 0.7–4.0)
MCH: 28.4 pg (ref 26.0–34.0)
MCHC: 32.3 g/dL (ref 30.0–36.0)
MCV: 87.9 fL (ref 80.0–100.0)
Monocytes Absolute: 1.1 10*3/uL — ABNORMAL HIGH (ref 0.1–1.0)
Monocytes Relative: 7 %
Neutro Abs: 13.7 10*3/uL — ABNORMAL HIGH (ref 1.7–7.7)
Neutrophils Relative %: 87 %
Platelets: 369 10*3/uL (ref 150–400)
RBC: 5.31 MIL/uL (ref 4.22–5.81)
RDW: 13.5 % (ref 11.5–15.5)
WBC: 15.8 10*3/uL — ABNORMAL HIGH (ref 4.0–10.5)
nRBC: 0 % (ref 0.0–0.2)

## 2021-01-10 LAB — COMPREHENSIVE METABOLIC PANEL
ALT: 28 U/L (ref 0–44)
AST: 26 U/L (ref 15–41)
Albumin: 3.9 g/dL (ref 3.5–5.0)
Alkaline Phosphatase: 63 U/L (ref 38–126)
Anion gap: 9 (ref 5–15)
BUN: 15 mg/dL (ref 6–20)
CO2: 21 mmol/L — ABNORMAL LOW (ref 22–32)
Calcium: 10 mg/dL (ref 8.9–10.3)
Chloride: 104 mmol/L (ref 98–111)
Creatinine, Ser: 1.01 mg/dL (ref 0.61–1.24)
GFR, Estimated: >60 mL/min (ref >60)
Glucose, Bld: 146 mg/dL — ABNORMAL HIGH (ref 70–99)
Potassium: 4.4 mmol/L (ref 3.5–5.1)
Sodium: 134 mmol/L — ABNORMAL LOW (ref 135–145)
Total Bilirubin: 0.5 mg/dL (ref 0.3–1.2)
Total Protein: 7.1 g/dL (ref 6.5–8.1)

## 2021-01-10 LAB — URINALYSIS, ROUTINE W REFLEX MICROSCOPIC
Bilirubin Urine: NEGATIVE
Glucose, UA: NEGATIVE mg/dL
Hgb urine dipstick: NEGATIVE
Ketones, ur: NEGATIVE mg/dL
Leukocytes,Ua: NEGATIVE
Nitrite: NEGATIVE
Protein, ur: NEGATIVE mg/dL
Specific Gravity, Urine: 1.01 (ref 1.005–1.030)
pH: 7 (ref 5.0–8.0)

## 2021-01-10 LAB — RAPID URINE DRUG SCREEN, HOSP PERFORMED
Amphetamines: NOT DETECTED
Barbiturates: NOT DETECTED
Benzodiazepines: POSITIVE — AB
Cocaine: NOT DETECTED
Opiates: NOT DETECTED
Tetrahydrocannabinol: POSITIVE — AB

## 2021-01-10 LAB — TROPONIN I (HIGH SENSITIVITY)
Troponin I (High Sensitivity): 6 ng/L (ref <18)
Troponin I (High Sensitivity): 7 ng/L (ref <18)

## 2021-01-10 LAB — ETHANOL: Alcohol, Ethyl (B): <10 mg/dL (ref <10)

## 2021-01-10 LAB — AMMONIA: Ammonia: 19 umol/L (ref 9–35)

## 2021-01-10 MED ORDER — SODIUM CHLORIDE 0.9 % IV BOLUS
1000.0000 mL | Freq: Once | INTRAVENOUS | Status: AC
  Administered 2021-01-10: 1000 mL via INTRAVENOUS

## 2021-01-10 MED ORDER — BENZONATATE 100 MG PO CAPS: 100.0000 mg | ORAL_CAPSULE | Freq: Three times a day (TID) | ORAL | 0 refills | Status: DC

## 2021-01-10 MED ORDER — IOHEXOL 350 MG/ML SOLN
100.0000 mL | Freq: Once | INTRAVENOUS | Status: AC | PRN
  Administered 2021-01-10: 100 mL via INTRAVENOUS

## 2021-01-10 MED ORDER — LORAZEPAM 2 MG/ML IJ SOLN
1.0000 mg | Freq: Once | INTRAMUSCULAR | Status: AC
  Administered 2021-01-10: 1 mg via INTRAVENOUS
  Filled 2021-01-10: qty 1

## 2021-01-10 MED ORDER — LORAZEPAM 1 MG PO TABS: 0.5000 mg | ORAL_TABLET | Freq: Two times a day (BID) | ORAL | 0 refills | Status: DC | PRN

## 2021-01-10 NOTE — ED Triage Notes (Signed)
Patient arrived by POV to triage complaining of increased SOB. Patient positive for Covid and states he cannot breath, appears anxious. Tachypneic on assessment and jumped from [facility] and could not figure out where he was or why here.

## 2021-01-10 NOTE — Telephone Encounter (Signed)
Called pt and LM and call back number regarding worsening SOB.

## 2021-01-10 NOTE — ED Provider Notes (Signed)
[facility] EMERGENCY DEPARTMENT Provider Note   CSN: [id number] Arrival date & time: 01/10/21  1249    History SOB  GENERAL WEARING is a 45 y.o. male with past medical history significant for depression, EtOH use, currently drinking 1-2 drinks a week presents for evaluation of shortness of breath.  Diagnosed with COVID 2 days ago.  Has had generalized weakness and fatigue.  States he feels so weak he can is unable to pick up his 3-year-old daughter. Weakness generalized in nature. He initially woke up today and felt like he was feeling a little bit better.  He attempted to work.  He was on a Zoom phone call when his heart began racing and he felt short of breath.  States he is coughing up green.  No hemoptysis.  No lower extremity edema, erythema or warmth.  Patient states that while he was on the phone call his "mind is gone."  He feels like he is unable to process information.  Denies any headache, blurred vision, neck pain.  Patient states "I do not know what is wrong with me."  States he feels "off in my head."  He is unable to describe this.  Denies any lightheadedness or dizziness associated HA.  Had telehealth visit today encouraged to come to the emergency department.  Has some generalized chest tightness with is SOB.  Does admit to occasional EtOH use, 1-2 beers a week.  He denies any prior history of EtOH withdrawal seizures or DTs.  Does use marijuana however denies any ingestion of dditional illicit substance use.  Denies any anxiety, thoughts of self.  No prior history of PE or DVT.  He is unsure if he has had any fevers however does admit to generalized decreased appetite.  Denies additional aggravating or alleviating factors. Started steroids yesterday for COVID per PCP.  History obtained from patient and past medical records.  No interpreter used.   <<Collateral wife, Philip Daugherty at [phone] >> Husband was on a Zoom call for work.  He became agitated while  he was on the call.  Went to his wife and was unable to find the MyChart app on his phone for his telehealth visit.  She state patient was "confused" did not know who he was.  Stated he had possible slurred speech at that time.  He did not note any paresthesias or weakness.  He was ambulatory at that time.  Patient states when he came to emergency department when she dropped him off at the door he continues to be confused however no slurred speech. Was able to ambulate into ED without difficulty. No hs of similar.     HPI     Past Medical History:  Diagnosis Date  . Acute pancreatitis 05/17/2016   Admitted to hosp while out of town in [place].  Likely secondary to ETOH.  GB/stone w/u NEG.  . Allergy to cats    takes claritin  . Asthma    childhood  . Depression    wellbutrin helped in the past; pt cites failure to respond to prozac, zoloft, and celexa  . Fear of flying    +insomnia; was on xanax regulary for long period and then abruptly stopped it and had w/drawal seizures.  . Hypogonadism male 2012 and 2016   Managed by Dr. Loanne Drilling (endo) as of 04/2017, but this was only brief.  I restarted pt on testos IM at end of Oct 2019.  . 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 Era Bumpers)  . Obesity, morbid, BMI 40.0-49.9 ([place])   . Palpitations    24h Holter.: sinus tachycardia  . Prediabetes 01/2017   A1c 6.3% ; 6.1% Dec 2018.  Marland Kitchen Syncope due to orthostatic hypotension    d/c'd from [employer] due to this.  Says if Wt 235 lbs or more then this is not a problem    Patient Active Problem List   Diagnosis Date Noted  . Myopia of both eyes 08/17/2019  . Regular astigmatism of left eye 08/17/2019  . Morbid obesity ([place]) 07/25/2019  . Alcohol dependence with other alcohol-induced disorder ([place]) 07/25/2019  . Psychophysiological insomnia 07/25/2019  . Depression, major, single episode, severe ([place]) 07/25/2019  . Suicidal ideations 07/25/2019  .  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 GAD (generalized anxiety disorder) 07/25/2019  . Major depressive disorder, recurrent episode, moderate ([place]) 05/28/2014  . Situational anxiety 03/19/2014  . Asthmatic bronchitis 08/06/2012  . Hypogonadism, male 01/28/2012  . Infertility male 01/28/2012  . Preventative health care 01/28/2012    Past Surgical History:  Procedure Laterality Date  . EYE SURGERY Bilateral    lasik       Family History  Problem Relation Age of Onset  . Depression Mother        bipolar disorder  . Parkinsonism Mother   . Diabetes Father   . Other Father        low testosterone    Social History   Tobacco Use  . Smoking status: Never Smoker  . Smokeless tobacco: Never Used  Substance Use Topics  . Alcohol use: Yes    Comment: has decreased since 01/04/12  . Drug use: No    Home Medications Prior to Admission medications   Medication Sig Start Date End Date Taking? Authorizing Provider  benzonatate (TESSALON) 100 MG capsule Take 1 capsule (100 mg total) by mouth every 8 (eight) hours. 01/10/21  Yes Harlea Goetzinger A, PA-C  lisdexamfetamine (VYVANSE) 10 MG capsule TAKE 1 CAPSULE BY MOUTH IN THE MORNING. 12/23/20   Abonza, Maritza, PA-C  LORazepam (ATIVAN) 1 MG tablet Take 0.5 tablets (0.5 mg total) by mouth every 12 (twelve) hours as needed for anxiety. 01/10/21  Yes Poppi Scantling A, PA-C  methylphenidate (RITALIN) 10 MG tablet TAKE 1 TABLET BY MOUTH 2 TIMES DAILY. 12/23/20   Lorrene Reid, PA-C  albuterol (VENTOLIN HFA) 108 (90 Base) MCG/ACT inhaler Inhale 2 puffs into the lungs every 6 (six) hours as needed for wheezing or shortness of breath. 01/08/21   Evelina Dun A, FNP  buPROPion (WELLBUTRIN XL) 150 MG 24 hr tablet Take 1 tablet (150 mg total) by mouth every morning. 12/23/20   Abonza, Maritza, PA-C  dexamethasone (DECADRON) 6 MG tablet Take 1 tablet (6 mg total) by mouth 2 (two) times daily. 01/08/21   Evelina Dun  A, FNP  DULoxetine (CYMBALTA) 60 MG capsule TAKE 1 CAPSULE (60 MG TOTAL) BY MOUTH DAILY. 12/23/20   Lorrene Reid, PA-C  testosterone cypionate (DEPOTESTOSTERONE CYPIONATE) 200 MG/ML injection Inject 1 mL (200 mg total) into the muscle every 14 (fourteen) days. 11/25/20   Lorrene Reid, PA-C    Allergies    Patient has no known allergies.  Review of Systems   Review of Systems  Constitutional: Positive for activity change, appetite change, chills and fatigue. Negative for fever.  HENT: Positive for congestion and rhinorrhea. Negative for sore throat.   Respiratory: Positive for cough, chest tightness and shortness of breath.  Cardiovascular: Negative.   Gastrointestinal: Negative.   Genitourinary: Negative.   Musculoskeletal: Positive for myalgias. Negative for arthralgias, back pain, gait problem, joint swelling, neck pain and neck stiffness.  Skin: Negative.   Neurological: Positive for weakness.       "Feeling off in my head."  Psychiatric/Behavioral: Negative.   All other systems reviewed and are negative.   Physical Exam Updated Vital Signs BP 129/79   Pulse 85   Temp 98.2 F (36.8 C) (Oral)   Resp 19   SpO2 94%   Physical Exam Vitals and nursing note reviewed.  Constitutional:      General: He is not in acute distress.    Appearance: He is well-developed. He is not ill-appearing, toxic-appearing or diaphoretic.  HENT:     Head: Normocephalic and atraumatic.     Nose: Nose normal.     Mouth/Throat:     Mouth: Mucous membranes are moist.  Eyes:     Pupils: Pupils are equal, round, and reactive to light.  Cardiovascular:     Rate and Rhythm: Normal rate and regular rhythm.     Pulses: Normal pulses.     Heart sounds: Normal heart sounds.  Pulmonary:     Effort: Pulmonary effort is normal. No respiratory distress.     Breath sounds: Normal breath sounds.     Comments: Speaks in full sentences.  He is hyperventilating. Abdominal:     General: Bowel sounds are  normal. There is no distension.     Palpations: Abdomen is soft.     Tenderness: There is no abdominal tenderness. There is no right CVA tenderness, left CVA tenderness or guarding.     Comments: Soft, nontender without rebound or guard  Musculoskeletal:        General: Normal range of motion.     Cervical back: Normal range of motion and neck supple.     Comments: Moves all 4 extremities without difficulty.  Compartments soft.  No clinical evidence of DVT on exam.  Homans negative  Skin:    General: Skin is warm and dry.     Capillary Refill: Capillary refill takes less than 2 seconds.     Comments: No edema, erythema or warmth.  No fluctuance or induration  Neurological:     Mental Status: He is alert.     Cranial Nerves: Cranial nerves are intact.     Sensory: Sensation is intact.     Motor: Motor function is intact.     Coordination: Coordination is intact.     Gait: Gait is intact.     Comments: Cranial nerves II to grossly intact Alert to person, place.  Initially states time is 1990 and corrects to 2022.  Initially states month is February and corrects to March.     ED Results / Procedures / Treatments   Labs (all labs ordered are listed, but only abnormal results are displayed) Labs Reviewed  CBC WITH DIFFERENTIAL/PLATELET - Abnormal; Notable for the following components:      Result Value   WBC 15.8 (*)    Neutro Abs 13.7 (*)    Monocytes Absolute 1.1 (*)    Abs Immature Granulocytes 0.18 (*)    All other components within normal limits  COMPREHENSIVE METABOLIC PANEL - Abnormal; Notable for the following components:   Sodium 134 (*)    CO2 21 (*)    Glucose, Bld 146 (*)    All other components within normal limits  RAPID URINE DRUG SCREEN, HOSP PERFORMED -  Abnormal; Notable for the following components:   Benzodiazepines POSITIVE (*)    Tetrahydrocannabinol POSITIVE (*)    All other components within normal limits  AMMONIA  URINALYSIS, ROUTINE W REFLEX  MICROSCOPIC  ETHANOL  TROPONIN I (HIGH SENSITIVITY)  TROPONIN I (HIGH SENSITIVITY)    EKG EKG Interpretation  Date/Time:  Tuesday January 10 2021 12:50:03 EDT Ventricular Rate:  110 PR Interval:  170 QRS Duration: 72 QT Interval:  310 QTC Calculation: 419 R Axis:   72 Text Interpretation: Sinus tachycardia Low voltage QRS Borderline ECG Sinus tachycardia, no STEMI Confirmed by Lavenia Atlas [phone]) on 01/10/2021 3:35:55 PM   Radiology CT Head Wo Contrast  Result Date: 01/10/2021 CLINICAL DATA:  Delirium.  Altered mental status.  COVID positive. EXAM: CT HEAD WITHOUT CONTRAST TECHNIQUE: Contiguous axial images were obtained from the base of the skull through the vertex without intravenous contrast. COMPARISON:  None. FINDINGS: Brain: Brain volume is normal for age. No intracranial hemorrhage, mass effect, or midline shift. No hydrocephalus. The basilar cisterns are patent. No evidence of territorial infarct or acute ischemia. No extra-axial or intracranial fluid collection. Vascular: No hyperdense vessel or unexpected calcification. Skull: No fracture or focal lesion. Sinuses/Orbits: Mild mucosal thickening of the paranasal sinuses. Mucous retention cyst left maxillary sinus. No fluid levels mastoid air cells are clear. Unremarkable orbits. Other: None. IMPRESSION: 1. Negative noncontrast head CT. 2. Mild paranasal sinus mucosal thickening. Electronically Signed   By: Keith Rake M.D.   On: 01/10/2021 15:44   CT Angio Chest PE W/Cm &/Or Wo Cm  Result Date: 01/10/2021 CLINICAL DATA:  Altered mental status. Hypoxia. Worsening dyspnea and cough. COVID positive. EXAM: CT ANGIOGRAPHY CHEST WITH CONTRAST TECHNIQUE: Multidetector CT imaging of the chest was performed using the standard protocol during bolus administration of intravenous contrast. Multiplanar CT image reconstructions and MIPs were obtained to evaluate the vascular anatomy. CONTRAST:  192mL OMNIPAQUE IOHEXOL 350 MG/ML SOLN  COMPARISON:  Chest radiograph from earlier today. FINDINGS: Cardiovascular: The study is low to moderate quality for the evaluation of pulmonary embolism, degraded by motion and suboptimal bolus opacification. There are no convincing filling defects in the central, lobar, segmental or subsegmental pulmonary artery branches to suggest acute pulmonary embolism. Normal course and caliber of the thoracic aorta. Top-normal caliber main pulmonary artery (3.2 cm diameter). Normal heart size. No significant pericardial fluid/thickening. Mediastinum/Nodes: No discrete thyroid nodules. Unremarkable esophagus. No pathologically enlarged axillary, mediastinal or hilar lymph nodes. Lungs/Pleura: No pneumothorax. No pleural effusion. No acute consolidative airspace disease, lung masses or significant pulmonary nodules. Upper abdomen: No acute abnormality Musculoskeletal: No aggressive appearing focal osseous lesions. Mild thoracic spondylosis. Review of the MIP images confirms the above findings. IMPRESSION: Limited motion degraded scan. No evidence of pulmonary embolism. No active pulmonary disease. Electronically Signed   By: Ilona Sorrel M.D.   On: 01/10/2021 16:10   DG Chest Portable 1 View  Result Date: 01/10/2021 CLINICAL DATA:  Shortness of breath.  COVID-19 positive patient. EXAM: PORTABLE CHEST 1 VIEW COMPARISON:  None. FINDINGS: There is slightly increased hazy density in left mid and lower lung zone relative to the right. Lung volumes are markedly low. No pneumothorax or pleural effusion. No acute or focal bony abnormality. IMPRESSION: Mildly increased density in left mid and lower lung zones may be due to atelectasis in this low volume chest but could represent pneumonia. Electronically Signed   By: Inge Rise M.D.   On: 01/10/2021 14:25    Procedures Procedures   Medications Ordered  in ED Medications  sodium chloride 0.9 % bolus 1,000 mL (1,000 mLs Intravenous New Bag/Given 01/10/21 1500)  LORazepam  (ATIVAN) injection 1 mg (1 mg Intravenous Given 01/10/21 1501)  iohexol (OMNIPAQUE) 350 MG/ML injection 100 mL (100 mLs Intravenous Contrast Given 01/10/21 1539)    ED Course  I have reviewed the triage vital signs and the nursing notes.  Pertinent labs & imaging results that were available during my care of the patient were reviewed by me and considered in my medical decision making (see chart for details).  45 year old presents for evaluation of shortness of breath and possible confusion.  He is afebrile, nonseptic, non-ill-appearing.  His heart sounds are clear.  He does appear to be hyperventilating.  No clinical evidence of DVT on exam.  Does state he has a sudden onset worsening shortness of breath and palpitations.  Given known COVID, he is tachycardic on exam will obtain CTA chest sure no PE.  His abdomen is soft, nontender.  He does admit to "being out of my head."  States he was on his new call earlier today and became confused, "angry out of my head."  He has difficult time scribing this.  His cranial nerves II to XII grossly intact.  Initially seems unsure about month and year however corrects to the correct time.  No headache, dizziness.  We will plan on labs, imaging and reassess.  Labs and imaging personally reviewed and interpreted:  CBC with leukocytosis at 40.0 Metabolic panel, sodium 867, glucose 142, no additional lecture light, renal normality Ethanol less than 10 Ammonia 19 Troponin 6 Chest x-ray possible left mid lobe pneumonia versus atelectasis. CT head without acute findings CTA Chest without acute infiltrates, PE, dissection, cardiomegaly, pulmonary edema  CONSULT with Neurology Dr. Lorrin Goodell.  Discused patient's labs, imaging and exam.  He does not feel this is a focal event and has low suspicion for acute stroke.  Does not feel patient needs MRI at this time.  He recommends outpatient PCP follow-up.  States if patient has worsening symptoms he should return to the  emergency department for further work-up.  Patient reassessed.  He has no confusion currently.  Feels improved after the Ativan given here.  He is at his baseline mentation.  He is AxO x4.  Patient is requesting medication for anxiety at discharge.  Discussed sensation of steroids this could possibly be causing some of his symptoms after discussing with neurology.  Discussed with wife via telephone which patient given permission to talk with as well as patient in room.  They are agreeable for outpatient follow-up and return for new or worsening symptoms.    At this time I low suspicion for acute ACS, PE, dissection, bacterial infectious process, acute CVA, TIA, dissection.  Patient does not appear encephalopathic.  Will DC home with symptomatic management.  Family are agreeable for this.  The patient has been appropriately medically screened and/or stabilized in the ED. I have low suspicion for any other emergent medical condition which would require further screening, evaluation or treatment in the ED or require inpatient management.  Patient is hemodynamically stable and in no acute distress.  Patient able to ambulate in department prior to ED.  Evaluation does not show acute pathology that would require ongoing or additional emergent interventions while in the emergency department or further inpatient treatment.  I have discussed the diagnosis with the patient and answered all questions.  Pain is been managed while in the emergency department and patient has no further  complaints prior to discharge.  Patient is comfortable with plan discussed in room and is stable for discharge at this time.  I have discussed strict return precautions for returning to the emergency department.  Patient was encouraged to follow-up with PCP/specialist refer to at discharge.    MDM Rules/Calculators/A&P                          Philip Daugherty was evaluated in Emergency Department on 01/10/2021 for the symptoms  described in the history of present illness. He was evaluated in the context of the global COVID-19 pandemic, which necessitated consideration that the patient might be at risk for infection with the SARS-CoV-2 virus that causes COVID-19. Institutional protocols and algorithms that pertain to the evaluation of patients at risk for COVID-19 are in a state of rapid change based on information released by regulatory bodies including the CDC and federal and state organizations. These policies and algorithms were followed during the patient's care in the ED. Final Clinical Impression(s) / ED Diagnoses Final diagnoses:  COVID  SOB (shortness of breath)  Episode of confusion    Rx / DC Orders ED Discharge Orders         Ordered    LORazepam (ATIVAN) 1 MG tablet  Every 12 hours PRN        01/10/21 1737    benzonatate (TESSALON) 100 MG capsule  Every 8 hours        01/10/21 1737           Linsay Vogt A, PA-C 01/10/21 1744    Horton, Alvin Critchley, DO 01/11/21 1510

## 2021-01-10 NOTE — Telephone Encounter (Signed)
Video visit Sunday  Woke up today  feeling ok and felt he was getting back to normal.  Attempted to work today. Pt stated he  was on Zoom call when his heart began  pounding, increased SOB at rest.  Coughing more phlegm and color is clear to green color.During call his HR elevated to  110/bpm. Pt stated his "mind is gone."  Pt stated that he had to have his wife locate MyChart app on his phone and he could not process or understand what I was telling him.  Asked pt to take deep breath and to say his abc's. Pt got to the letter d and began coughing and SOB.   No headache. No blurred vision.  Very weak and cannot pick up daughter. Pt stated he is getting weaker as the day goes on.  Had difficulty with recalling the month. No pulse oximeter in house. Pt had his wife finish the call. Discussed his condition with his wife and advised to call 911 now. Wife verbalized understanding will follow disposition.

## 2021-01-10 NOTE — Discharge Instructions (Signed)
Is a pleasure taking care of you here in the emergency department.  Your symptoms seem to have improved with some anxiety medication.  I have written you for additional medication.  As discussed your CT scan of your chest as well as your head did not show any evidence of any acute infectious process, blood clots or stroke.  Your labs are reassuring.  We did discuss with neurology.  They recommended stopping the steroids.  Return here to the emergency department if you have any new or any worsening symptoms or otherwise may follow-up with primary care provider.

## 2021-01-10 NOTE — Telephone Encounter (Signed)
5  This encounter was created in error - please disregard.

## 2021-01-11 ENCOUNTER — Telehealth: Payer: Self-pay

## 2021-01-11 NOTE — Telephone Encounter (Signed)
Transition Care Management Unsuccessful Follow-up Telephone Call  Date of discharge and from where: 01/10/2021 from [facility]  Attempts:  1st Attempt  Reason for unsuccessful TCM follow-up call:  Left voice message

## 2021-01-12 NOTE — Telephone Encounter (Signed)
Transition Care Management Unsuccessful Follow-up Telephone Call  Date of discharge and from where:  01/10/2021 from [facility]  Attempts:  2nd Attempt  Reason for unsuccessful TCM follow-up call:  Left voice message

## 2021-01-13 NOTE — Telephone Encounter (Signed)
  Transition Care Management Unsuccessful Follow-up Telephone Call  Date of discharge and from where:  01/10/2021 from [facility]  Attempts:  3rd Attempt  Reason for unsuccessful TCM follow-up call:  Left voice message

## 2021-01-25 ENCOUNTER — Other Ambulatory Visit: Payer: Self-pay | Admitting: Physician Assistant

## 2021-01-25 ENCOUNTER — Other Ambulatory Visit: Payer: Self-pay | Admitting: Family

## 2021-01-25 DIAGNOSIS — F988 Other specified behavioral and emotional disorders with onset usually occurring in childhood and adolescence: Secondary | ICD-10-CM

## 2021-01-25 DIAGNOSIS — U071 COVID-19: Secondary | ICD-10-CM

## 2021-01-26 NOTE — Telephone Encounter (Signed)
Patient last seen 11/22/20 and advised to follow up in 3 months.   Last refills given 12/23/20 for 30 day supplies.

## 2021-02-20 ENCOUNTER — Encounter: Payer: Self-pay | Admitting: Physician Assistant

## 2021-02-20 ENCOUNTER — Other Ambulatory Visit: Payer: Self-pay

## 2021-02-20 ENCOUNTER — Ambulatory Visit: Payer: 59 | Admitting: Physician Assistant

## 2021-02-20 VITALS — BP 116/78 | HR 99 | Temp 99.3°F | Ht 73.0 in | Wt 318.8 lb

## 2021-02-20 DIAGNOSIS — E291 Testicular hypofunction: Secondary | ICD-10-CM | POA: Diagnosis not present

## 2021-02-20 DIAGNOSIS — F988 Other specified behavioral and emotional disorders with onset usually occurring in childhood and adolescence: Secondary | ICD-10-CM

## 2021-02-20 DIAGNOSIS — F331 Major depressive disorder, recurrent, moderate: Secondary | ICD-10-CM | POA: Diagnosis not present

## 2021-02-20 MED ORDER — LISDEXAMFETAMINE DIMESYLATE 10 MG PO CAPS
ORAL_CAPSULE | ORAL | 0 refills | Status: DC
Start: 2021-02-20 — End: 2021-04-10

## 2021-02-20 MED ORDER — METHYLPHENIDATE HCL 10 MG PO TABS: 10.0000 mg | ORAL_TABLET | Freq: Two times a day (BID) | ORAL | 0 refills | Status: DC

## 2021-02-20 MED ORDER — SEMAGLUTIDE-WEIGHT MANAGEMENT 0.25 MG/0.5ML ~~LOC~~ SOAJ: 0.2500 mg | SUBCUTANEOUS | 0 refills | Status: DC

## 2021-02-20 NOTE — Progress Notes (Signed)
Established Patient Office Visit  Subjective:  Patient ID: Philip Daugherty, male    DOB: 1976-03-07  Age: 45 y.o. MRN: [id number]  CC:  Chief Complaint  Patient presents with  . Follow-up  . ADHD    HPI Philip Daugherty presents for follow up on ADD and mood management.   ADD: Patient reports the addition of methylphenidate has similar efficacy to Adderall but feels like Adderall worked a little better. Reports medication compliance. Denies mood changes, chest pain, palpitations or sleep disturbance.  Mood: Taking medications as directed without issues. Denies SI/HI or mood changes.   Weight: In the past has lost weight with dietary changes and being active. Recently started changing his eating habits and is trying to make healthier choices. Has not tried medication in the past and is interested on starting something to help him during his weight loss journey.   Testosterone: States has noticed some improvement with fatigue with increased frequency every two weeks and feels like would benefit from once weekly dosing.   Past Medical History:  Diagnosis Date  . Acute pancreatitis 05/17/2016   Admitted to hosp while out of town in [place].  Likely secondary to ETOH.  GB/stone w/u NEG.  . Allergy to cats    takes claritin  . Asthma    childhood  . Depression    wellbutrin helped in the past; pt cites failure to respond to prozac, zoloft, and celexa  . Fear of flying    +insomnia; was on xanax regulary for long period and then abruptly stopped it and had w/drawal seizures.  . Hypogonadism male 2012 and 2016   Managed by Dr. Loanne Drilling ([facility]) as of 04/2017, but this was only brief.  I restarted pt on testos IM at end of Oct 2019.  . 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 Era Bumpers)  . Obesity, morbid, BMI 40.0-49.9 ([place])   . Palpitations    24h Holter.: sinus tachycardia  . Prediabetes 01/2017   A1c 6.3% ; 6.1% Dec 2018.  Marland Kitchen  Syncope due to orthostatic hypotension    d/c'd from [employer] due to this.  Says if Wt 235 lbs or more then this is not a problem    Past Surgical History:  Procedure Laterality Date  . EYE SURGERY Bilateral    lasik    Family History  Problem Relation Age of Onset  . Depression Mother        bipolar disorder  . Parkinsonism Mother   . Diabetes Father   . Other Father        low testosterone    Social History   Socioeconomic History  . Marital status: Married    Spouse name: Not on file  . Number of children: Not on file  . Years of education: Not on file  . Highest education level: Not on file  Occupational History  . Not on file  Tobacco Use  . Smoking status: Never Smoker  . Smokeless tobacco: Never Used  Substance and Sexual Activity  . Alcohol use: Yes    Comment: has decreased since 01/04/12  . Drug use: No  . Sexual activity: Not on file  Other Topics Concern  . Not on file  Social History Narrative   Married, second marriage.  No children.   Orig from [place], spent time in [place], college in [place].   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   No tobacco.  Alcohol: 3-4 bourbon drinks most days, cut back as of 12/2011.   Exercise: sporadic (elliptical, walking).  No hx of drug use/abuse.      Social Determinants of Health   Financial Resource Strain: Not on file  Food Insecurity: Not on file  Transportation Needs: Not on file  Physical Activity: Not on file  Stress: Not on file  Social Connections: Not on file  Intimate Partner Violence: Not on file    Outpatient Medications Prior to Visit  Medication Sig Dispense Refill  . albuterol (VENTOLIN HFA) 108 (90 Base) MCG/ACT inhaler Inhale 2 puffs into the lungs every 6 (six) hours as needed for wheezing or shortness of breath. 8 g 0  . buPROPion (WELLBUTRIN XL) 150 MG 24 hr  tablet Take 1 tablet (150 mg total) by mouth every morning. 90 tablet 0  . DULoxetine (CYMBALTA) 60 MG capsule TAKE 1 CAPSULE (60 MG TOTAL) BY MOUTH DAILY. 90 capsule 0  . benzonatate (TESSALON) 100 MG capsule Take 1 capsule (100 mg total) by mouth every 8 (eight) hours. 21 capsule 0  . dexamethasone (DECADRON) 6 MG tablet Take 1 tablet (6 mg total) by mouth 2 (two) times daily. 14 tablet 0  . lisdexamfetamine (VYVANSE) 10 MG capsule TAKE 1 CAPSULE BY MOUTH IN THE MORNING. 30 capsule 0  . LORazepam (ATIVAN) 1 MG tablet Take 0.5 tablets (0.5 mg total) by mouth every 12 (twelve) hours as needed for anxiety. 10 tablet 0  . methylphenidate (RITALIN) 10 MG tablet TAKE 1 TABLET BY MOUTH 2 TIMES DAILY. 60 tablet 0  . testosterone cypionate (DEPOTESTOSTERONE CYPIONATE) 200 MG/ML injection Inject 1 mL (200 mg total) into the muscle every 14 (fourteen) days. 10 mL 0   No facility-administered medications prior to visit.    No Known Allergies  ROS Review of Systems A fourteen system review of systems was performed and found to be positive as per HPI.   Objective:    Physical Exam General:  Well Developed, well nourished, in no acute distress.  Neuro:  Alert and oriented,  extra-ocular muscles intact  HEENT:  Normocephalic, atraumatic, neck supple Skin:  no gross rash, warm, pink. Cardiac:  RRR, S1 S2, no murmur  Respiratory:  ECTA B/L w/o wheezing, crackles or rales, Not using accessory muscles, speaking in full sentences- unlabored. Vascular:  Ext warm, no cyanosis apprec.; cap RF less 2 sec. Psych:  No HI/SI, judgement and insight good, Euthymic mood. Full Affect.   BP 116/78   Pulse 99   Temp 99.3 F (37.4 C)   Ht _0  (1.854 m)   Wt (!) 318 lb 12.8 oz (144.6 kg)   SpO2 97%   BMI 42.06 kg/m  Wt Readings from Last 3 Encounters:  02/20/21 (!) 318 lb 12.8 oz (144.6 kg)  11/22/20 (!) 326 lb 1.6 oz (147.9 kg)  09/06/20 (!) 308 lb 6.4 oz (139.9 kg)     Health Maintenance Due  Topic  Date Due  . COVID-19 Vaccine (1) Never done  . Hepatitis C Screening  Never done    There are no preventive care reminders to display for this patient.  Lab Results  Component Value Date   TSH 2.540 08/22/2020   Lab Results  Component Value Date   WBC 7.9 02/21/2021   HGB 16.1 02/21/2021   HCT 47.8 02/21/2021   MCV 85 02/21/2021   PLT 381 02/21/2021  Lab Results  Component Value Date   NA 139 02/21/2021   K 4.8 02/21/2021   CO2 25 02/21/2021   GLUCOSE 103 (H) 02/21/2021   BUN 10 02/21/2021   CREATININE 1.16 02/21/2021   BILITOT 0.5 02/21/2021   ALKPHOS 92 02/21/2021   AST 22 02/21/2021   ALT 25 02/21/2021   PROT 7.0 02/21/2021   ALBUMIN 4.6 02/21/2021   CALCIUM 10.0 02/21/2021   ANIONGAP 9 01/10/2021   EGFR 80 02/21/2021   GFR 101.42 10/01/2017   Lab Results  Component Value Date   CHOL 196 08/22/2020   Lab Results  Component Value Date   HDL 38 (L) 08/22/2020   Lab Results  Component Value Date   LDLCALC 132 (H) 08/22/2020   Lab Results  Component Value Date   TRIG 146 08/22/2020   Lab Results  Component Value Date   CHOLHDL 5.2 (H) 08/22/2020   Lab Results  Component Value Date   HGBA1C 6.1 (H) 08/22/2020      Assessment & Plan:   Problem List Items Addressed This Visit      Endocrine   Hypogonadism, male     Other   Major depressive disorder, recurrent episode, moderate (HCC) - Primary   Morbid obesity ([place])   Relevant Medications   Semaglutide-Weight Management 0.25 MG/0.5ML SOAJ   lisdexamfetamine (VYVANSE) 10 MG capsule   methylphenidate (RITALIN) 10 MG tablet    Other Visit Diagnoses    Attention deficit disorder (ADD) in adult       Relevant Medications   lisdexamfetamine (VYVANSE) 10 MG capsule   methylphenidate (RITALIN) 10 MG tablet      ADD in adult: -Stable. -Continue current medication regimen. Discussed with patient we can further discuss resuming Adderall at next OV. -PDMP reviewed no aberrancies noted. Vyvanse  and Methylphenidate last filled 01/26/21. -BP and pulse stable. -Follow up in 3 months for medication management.  MDD, recurrent episode, moderate: -PHQ-9 score of 2, improved from prior (score of 2). -Continue current medication regimen. -Will continue to monitor.  Hypogonadism, male: -Last testosterone 89 ng/dL 11/24/20. -Advised patient to schedule lab visit early in the morning to repeat testosterone and CBC, CMP for medication management. Pending lab results will make treatment adjustments if indicated.  Morbid obesity: -Associated with hyperlipidemia and prediabetes. -Patient has lost 8 pounds since last visit. -Discussed with patient management options and agreeable to starting GLP-1 therapy with [facility] along with diet and lifestyle changes. No personal or family history of MTC or MEN. -Follow up in 4 weeks for weight management.     Meds ordered this encounter  Medications  . Semaglutide-Weight Management 0.25 MG/0.5ML SOAJ    Sig: Inject 0.25 mg into the skin once a week.    Dispense:  2 mL    Refill:  0    Order Specific Question:   Supervising Provider    Answer:   Beatrice Lecher D [[id number]]  . lisdexamfetamine (VYVANSE) 10 MG capsule    Sig: TAKE 1 CAPSULE BY MOUTH IN THE MORNING.    Dispense:  30 capsule    Refill:  0    Order Specific Question:   Supervising Provider    Answer:   Beatrice Lecher D [[id number]]  . methylphenidate (RITALIN) 10 MG tablet    Sig: Take 1 tablet (10 mg total) by mouth 2 (two) times daily.    Dispense:  60 tablet    Refill:  0    Order Specific Question:   Supervising Provider  Answer:   Beatrice Lecher D [[id number]]    Follow-up: Return in about 3 months (around 05/23/2021) for ADD, Mood; wt follow up in 4 weeks; lab visit this week or next for testosterone, cbc, cmp .   Note:  This note was prepared with assistance of Dragon voice recognition software. Occasional wrong-word or sound-a-like substitutions may have occurred due to  the inherent limitations of voice recognition software.  Lorrene Reid, PA-C

## 2021-02-20 NOTE — Patient Instructions (Signed)
Calorie Counting for Weight Loss Calories are units of energy. Your body needs a certain number of calories from food to keep going throughout the day. When you eat or drink more calories than your body needs, your body stores the extra calories mostly as fat. When you eat or drink fewer calories than your body needs, your body burns fat to get the energy it needs. Calorie counting means keeping track of how many calories you eat and drink each day. Calorie counting can be helpful if you need to lose weight. If you eat fewer calories than your body needs, you should lose weight. Ask your health care provider what a healthy weight is for you. For calorie counting to work, you will need to eat the right number of calories each day to lose a healthy amount of weight per week. A dietitian can help you figure out how many calories you need in a day and will suggest ways to reach your calorie goal.  A healthy amount of weight to lose each week is usually 1-2 lb (0.5-0.9 kg). This usually means that your daily calorie intake should be reduced by 500-750 calories.  Eating 1,200-1,500 calories a day can help most women lose weight.  Eating 1,500-1,800 calories a day can help most men lose weight. What do I need to know about calorie counting? Work with your health care provider or dietitian to determine how many calories you should get each day. To meet your daily calorie goal, you will need to:  Find out how many calories are in each food that you would like to eat. Try to do this before you eat.  Decide how much of the food you plan to eat.  Keep a food log. Do this by writing down what you ate and how many calories it had. To successfully lose weight, it is important to balance calorie counting with a healthy lifestyle that includes regular activity. Where do I find calorie information? The number of calories in a food can be found on a Nutrition Facts label. If a food does not have a Nutrition Facts  label, try to look up the calories online or ask your dietitian for help. Remember that calories are listed per serving. If you choose to have more than one serving of a food, you will have to multiply the calories per serving by the number of servings you plan to eat. For example, the label on a package of bread might say that a serving size is 1 slice and that there are 90 calories in a serving. If you eat 1 slice, you will have eaten 90 calories. If you eat 2 slices, you will have eaten 180 calories.   How do I keep a food log? After each time that you eat, record the following in your food log as soon as possible:  What you ate. Be sure to include toppings, sauces, and other extras on the food.  How much you ate. This can be measured in cups, ounces, or number of items.  How many calories were in each food and drink.  The total number of calories in the food you ate. Keep your food log near you, such as in a pocket-sized notebook or on an app or website on your mobile phone. Some programs will calculate calories for you and show you how many calories you have left to meet your daily goal. What are some portion-control tips?  Know how many calories are in a serving. This will   help you know how many servings you can have of a certain food.  Use a measuring cup to measure serving sizes. You could also try weighing out portions on a kitchen scale. With time, you will be able to estimate serving sizes for some foods.  Take time to put servings of different foods on your favorite plates or in your favorite bowls and cups so you know what a serving looks like.  Try not to eat straight from a food's packaging, such as from a bag or box. Eating straight from the package makes it hard to see how much you are eating and can lead to overeating. Put the amount you would like to eat in a cup or on a plate to make sure you are eating the right portion.  Use smaller plates, glasses, and bowls for smaller  portions and to prevent overeating.  Try not to multitask. For example, avoid watching TV or using your computer while eating. If it is time to eat, sit down at a table and enjoy your food. This will help you recognize when you are full. It will also help you be more mindful of what and how much you are eating. What are tips for following this plan? Reading food labels  Check the calorie count compared with the serving size. The serving size may be smaller than what you are used to eating.  Check the source of the calories. Try to choose foods that are high in protein, fiber, and vitamins, and low in saturated fat, trans fat, and sodium. Shopping  Read nutrition labels while you shop. This will help you make healthy decisions about which foods to buy.  Pay attention to nutrition labels for low-fat or fat-free foods. These foods sometimes have the same number of calories or more calories than the full-fat versions. They also often have added sugar, starch, or salt to make up for flavor that was removed with the fat.  Make a grocery list of lower-calorie foods and stick to it. Cooking  Try to cook your favorite foods in a healthier way. For example, try baking instead of frying.  Use low-fat dairy products. Meal planning  Use more fruits and vegetables. One-half of your plate should be fruits and vegetables.  Include lean proteins, such as chicken, turkey, and fish. Lifestyle Each week, aim to do one of the following:  150 minutes of moderate exercise, such as walking.  75 minutes of vigorous exercise, such as running. General information  Know how many calories are in the foods you eat most often. This will help you calculate calorie counts faster.  Find a way of tracking calories that works for you. Get creative. Try different apps or programs if writing down calories does not work for you. What foods should I eat?  Eat nutritious foods. It is better to have a nutritious,  high-calorie food, such as an avocado, than a food with few nutrients, such as a bag of potato chips.  Use your calories on foods and drinks that will fill you up and will not leave you hungry soon after eating. ? Examples of foods that fill you up are nuts and nut butters, vegetables, lean proteins, and high-fiber foods such as whole grains. High-fiber foods are foods with more than 5 g of fiber per serving.  Pay attention to calories in drinks. Low-calorie drinks include water and unsweetened drinks. The items listed above may not be a complete list of foods and beverages you can eat.   Contact a dietitian for more information.   What foods should I limit? Limit foods or drinks that are not good sources of vitamins, minerals, or protein or that are high in unhealthy fats. These include:  Candy.  Other sweets.  Sodas, specialty coffee drinks, alcohol, and juice. The items listed above may not be a complete list of foods and beverages you should avoid. Contact a dietitian for more information. How do I count calories when eating out?  Pay attention to portions. Often, portions are much larger when eating out. Try these tips to keep portions smaller: ? Consider sharing a meal instead of getting your own. ? If you get your own meal, eat only half of it. Before you start eating, ask for a container and put half of your meal into it. ? When available, consider ordering smaller portions from the menu instead of full portions.  Pay attention to your food and drink choices. Knowing the way food is cooked and what is included with the meal can help you eat fewer calories. ? If calories are listed on the menu, choose the lower-calorie options. ? Choose dishes that include vegetables, fruits, whole grains, low-fat dairy products, and lean proteins. ? Choose items that are boiled, broiled, grilled, or steamed. Avoid items that are buttered, battered, fried, or served with cream sauce. Items labeled as  crispy are usually fried, unless stated otherwise. ? Choose water, low-fat milk, unsweetened iced tea, or other drinks without added sugar. If you want an alcoholic beverage, choose a lower-calorie option, such as a glass of wine or light beer. ? Ask for dressings, sauces, and syrups on the side. These are usually high in calories, so you should limit the amount you eat. ? If you want a salad, choose a garden salad and ask for grilled meats. Avoid extra toppings such as bacon, cheese, or fried items. Ask for the dressing on the side, or ask for olive oil and vinegar or lemon to use as dressing.  Estimate how many servings of a food you are given. Knowing serving sizes will help you be aware of how much food you are eating at restaurants. Where to find more information  Centers for Disease Control and Prevention: www.cdc.gov  [place] Department of Agriculture: myplate.gov Summary  Calorie counting means keeping track of how many calories you eat and drink each day. If you eat fewer calories than your body needs, you should lose weight.  A healthy amount of weight to lose per week is usually 1-2 lb (0.5-0.9 kg). This usually means reducing your daily calorie intake by 500-750 calories.  The number of calories in a food can be found on a Nutrition Facts label. If a food does not have a Nutrition Facts label, try to look up the calories online or ask your dietitian for help.  Use smaller plates, glasses, and bowls for smaller portions and to prevent overeating.  Use your calories on foods and drinks that will fill you up and not leave you hungry shortly after a meal. This information is not intended to replace advice given to you by your health care provider. Make sure you discuss any questions you have with your health care provider. Document Revised: 11/12/2019 Document Reviewed: 11/12/2019 Elsevier Patient Education  2021 [employer]  

## 2021-02-21 ENCOUNTER — Other Ambulatory Visit: Payer: 59

## 2021-02-21 DIAGNOSIS — E291 Testicular hypofunction: Secondary | ICD-10-CM

## 2021-02-21 DIAGNOSIS — Z0000 Encounter for general adult medical examination without abnormal findings: Secondary | ICD-10-CM

## 2021-02-22 ENCOUNTER — Other Ambulatory Visit: Payer: Self-pay | Admitting: Physician Assistant

## 2021-02-22 DIAGNOSIS — E291 Testicular hypofunction: Secondary | ICD-10-CM

## 2021-02-22 LAB — COMPREHENSIVE METABOLIC PANEL
ALT: 25 IU/L (ref 0–44)
AST: 22 IU/L (ref 0–40)
Albumin/Globulin Ratio: 1.9 (ref 1.2–2.2)
Albumin: 4.6 g/dL (ref 4.0–5.0)
Alkaline Phosphatase: 92 IU/L (ref 44–121)
BUN/Creatinine Ratio: 9 (ref 9–20)
BUN: 10 mg/dL (ref 6–24)
Bilirubin Total: 0.5 mg/dL (ref 0.0–1.2)
CO2: 25 mmol/L (ref 20–29)
Calcium: 10 mg/dL (ref 8.7–10.2)
Chloride: 98 mmol/L (ref 96–106)
Creatinine, Ser: 1.16 mg/dL (ref 0.76–1.27)
Globulin, Total: 2.4 g/dL (ref 1.5–4.5)
Glucose: 103 mg/dL — ABNORMAL HIGH (ref 65–99)
Potassium: 4.8 mmol/L (ref 3.5–5.2)
Sodium: 139 mmol/L (ref 134–144)
Total Protein: 7 g/dL (ref 6.0–8.5)
eGFR: 80 mL/min/{1.73_m2} (ref >59)

## 2021-02-22 LAB — CBC
Hematocrit: 47.8 % (ref 37.5–51.0)
Hemoglobin: 16.1 g/dL (ref 13.0–17.7)
MCH: 28.5 pg (ref 26.6–33.0)
MCHC: 33.7 g/dL (ref 31.5–35.7)
MCV: 85 fL (ref 79–97)
Platelets: 381 10*3/uL (ref 150–450)
RBC: 5.65 x10E6/uL (ref 4.14–5.80)
RDW: 13.1 % (ref 11.6–15.4)
WBC: 7.9 10*3/uL (ref 3.4–10.8)

## 2021-02-22 LAB — TESTOSTERONE: Testosterone: 307 ng/dL (ref 264–916)

## 2021-02-22 MED ORDER — TESTOSTERONE CYPIONATE 200 MG/ML IM SOLN: 200.0000 mg | INTRAMUSCULAR | 0 refills | Status: DC

## 2021-03-22 ENCOUNTER — Encounter: Payer: Self-pay | Admitting: Physician Assistant

## 2021-03-22 DIAGNOSIS — N529 Male erectile dysfunction, unspecified: Secondary | ICD-10-CM

## 2021-03-22 MED ORDER — SILDENAFIL CITRATE 25 MG PO TABS: 25.0000 mg | ORAL_TABLET | Freq: Every day | ORAL | 0 refills | Status: DC | PRN

## 2021-04-07 ENCOUNTER — Other Ambulatory Visit: Payer: Self-pay | Admitting: Physician Assistant

## 2021-04-07 DIAGNOSIS — E291 Testicular hypofunction: Secondary | ICD-10-CM

## 2021-04-07 DIAGNOSIS — F988 Other specified behavioral and emotional disorders with onset usually occurring in childhood and adolescence: Secondary | ICD-10-CM

## 2021-04-07 DIAGNOSIS — F322 Major depressive disorder, single episode, severe without psychotic features: Secondary | ICD-10-CM

## 2021-04-07 DIAGNOSIS — F411 Generalized anxiety disorder: Secondary | ICD-10-CM

## 2021-04-07 NOTE — Telephone Encounter (Signed)
He is going to need to have scheduled follow up before these meds are filled.

## 2021-04-07 NOTE — Telephone Encounter (Signed)
Can you reach out to the patient and schedule a follow up appointment per his last AVS?

## 2021-04-10 ENCOUNTER — Other Ambulatory Visit: Payer: Self-pay | Admitting: Physician Assistant

## 2021-04-10 ENCOUNTER — Encounter: Payer: Self-pay | Admitting: Physician Assistant

## 2021-04-10 DIAGNOSIS — F411 Generalized anxiety disorder: Secondary | ICD-10-CM

## 2021-04-10 DIAGNOSIS — F988 Other specified behavioral and emotional disorders with onset usually occurring in childhood and adolescence: Secondary | ICD-10-CM

## 2021-04-10 DIAGNOSIS — E291 Testicular hypofunction: Secondary | ICD-10-CM

## 2021-04-10 DIAGNOSIS — F322 Major depressive disorder, single episode, severe without psychotic features: Secondary | ICD-10-CM

## 2021-04-10 MED ORDER — SEMAGLUTIDE-WEIGHT MANAGEMENT 0.25 MG/0.5ML ~~LOC~~ SOAJ: 0.2500 mg | SUBCUTANEOUS | 0 refills | Status: DC

## 2021-04-10 MED ORDER — TESTOSTERONE CYPIONATE 200 MG/ML IM SOLN: 200.0000 mg | INTRAMUSCULAR | 0 refills | Status: DC

## 2021-04-10 MED ORDER — DULOXETINE HCL 60 MG PO CPEP: 60.0000 mg | ORAL_CAPSULE | Freq: Every day | ORAL | 0 refills | Status: DC

## 2021-04-10 MED ORDER — METHYLPHENIDATE HCL 10 MG PO TABS: 10.0000 mg | ORAL_TABLET | Freq: Two times a day (BID) | ORAL | 0 refills | Status: DC

## 2021-04-10 MED ORDER — LISDEXAMFETAMINE DIMESYLATE 10 MG PO CAPS: ORAL_CAPSULE | ORAL | 0 refills | Status: DC

## 2021-04-10 MED ORDER — BUPROPION HCL ER (XL) 150 MG PO TB24: 150.0000 mg | ORAL_TABLET | Freq: Every morning | ORAL | 0 refills | Status: DC

## 2021-04-10 NOTE — Telephone Encounter (Signed)
Patient last seen 02/20/21 and advised to follow up in 3 months.

## 2021-04-11 ENCOUNTER — Encounter: Payer: Self-pay | Admitting: Physician Assistant

## 2021-04-11 ENCOUNTER — Other Ambulatory Visit: Payer: Self-pay | Admitting: Physician Assistant

## 2021-04-11 DIAGNOSIS — E291 Testicular hypofunction: Secondary | ICD-10-CM

## 2021-04-26 ENCOUNTER — Encounter: Payer: Self-pay | Admitting: Physician Assistant

## 2021-04-26 MED ORDER — SEMAGLUTIDE-WEIGHT MANAGEMENT 0.25 MG/0.5ML ~~LOC~~ SOAJ: 0.2500 mg | SUBCUTANEOUS | 0 refills | Status: DC

## 2021-05-11 ENCOUNTER — Other Ambulatory Visit: Payer: Self-pay | Admitting: Physician Assistant

## 2021-05-11 DIAGNOSIS — E291 Testicular hypofunction: Secondary | ICD-10-CM

## 2021-05-11 DIAGNOSIS — F988 Other specified behavioral and emotional disorders with onset usually occurring in childhood and adolescence: Secondary | ICD-10-CM

## 2021-05-16 ENCOUNTER — Other Ambulatory Visit: Payer: Self-pay | Admitting: Physician Assistant

## 2021-05-16 DIAGNOSIS — F988 Other specified behavioral and emotional disorders with onset usually occurring in childhood and adolescence: Secondary | ICD-10-CM

## 2021-05-16 DIAGNOSIS — E291 Testicular hypofunction: Secondary | ICD-10-CM

## 2021-06-15 ENCOUNTER — Other Ambulatory Visit: Payer: Self-pay | Admitting: Physician Assistant

## 2021-06-15 DIAGNOSIS — F988 Other specified behavioral and emotional disorders with onset usually occurring in childhood and adolescence: Secondary | ICD-10-CM

## 2021-06-15 DIAGNOSIS — E291 Testicular hypofunction: Secondary | ICD-10-CM

## 2021-07-13 ENCOUNTER — Ambulatory Visit (INDEPENDENT_AMBULATORY_CARE_PROVIDER_SITE_OTHER): Payer: BC Managed Care – PPO | Admitting: Physician Assistant

## 2021-07-13 ENCOUNTER — Encounter: Payer: Self-pay | Admitting: Physician Assistant

## 2021-07-13 ENCOUNTER — Other Ambulatory Visit: Payer: Self-pay

## 2021-07-13 VITALS — BP 130/84 | HR 90 | Temp 98.7°F | Ht 73.0 in | Wt 307.7 lb

## 2021-07-13 DIAGNOSIS — F411 Generalized anxiety disorder: Secondary | ICD-10-CM

## 2021-07-13 DIAGNOSIS — H9213 Otorrhea, bilateral: Secondary | ICD-10-CM

## 2021-07-13 DIAGNOSIS — F988 Other specified behavioral and emotional disorders with onset usually occurring in childhood and adolescence: Secondary | ICD-10-CM

## 2021-07-13 DIAGNOSIS — Z23 Encounter for immunization: Secondary | ICD-10-CM | POA: Diagnosis not present

## 2021-07-13 DIAGNOSIS — E291 Testicular hypofunction: Secondary | ICD-10-CM

## 2021-07-13 DIAGNOSIS — F331 Major depressive disorder, recurrent, moderate: Secondary | ICD-10-CM

## 2021-07-13 DIAGNOSIS — N529 Male erectile dysfunction, unspecified: Secondary | ICD-10-CM

## 2021-07-13 MED ORDER — AMPHETAMINE-DEXTROAMPHET ER 30 MG PO CP24: 30.0000 mg | ORAL_CAPSULE | ORAL | 0 refills | Status: DC

## 2021-07-13 MED ORDER — AMPHETAMINE-DEXTROAMPHETAMINE 10 MG PO TABS: ORAL_TABLET | ORAL | 0 refills | Status: DC

## 2021-07-13 MED ORDER — TESTOSTERONE CYPIONATE 200 MG/ML IM SOLN: 200.0000 mg | INTRAMUSCULAR | 0 refills | Status: DC

## 2021-07-13 NOTE — Patient Instructions (Signed)
Eustachian Tube Dysfunction °Eustachian tube dysfunction refers to a condition in which a blockage develops in the narrow passage that connects the middle ear to the back of the nose (eustachian tube). The eustachian tube regulates air pressure in the middle ear by letting air move between the ear and nose. It also helps to drain fluid from the middle ear space. °Eustachian tube dysfunction can affect one or both ears. When the eustachian tube does not function properly, air pressure, fluid, or both can build up in the middle ear. °What are the causes? °This condition occurs when the eustachian tube becomes blocked or cannot open normally. Common causes of this condition include: °Ear infections. °Colds and other infections that affect the nose, mouth, and throat (upper respiratory tract). °Allergies. °Irritation from cigarette smoke. °Irritation from stomach acid coming up into the esophagus (gastroesophageal reflux). The esophagus is the part of the body that moves food from the mouth to the stomach. °Sudden changes in air pressure, such as from descending in an airplane or scuba diving. °Abnormal growths in the nose or throat, such as: °Growths that line the nose (nasal polyps). °Abnormal growth of cells (tumors). °Enlarged tissue at the back of the throat (adenoids). °What increases the risk? °You are more likely to develop this condition if: °You smoke. °You are overweight. °You are a child who has: °Certain birth defects of the mouth, such as cleft palate. °Large tonsils or adenoids. °What are the signs or symptoms? °Common symptoms of this condition include: °A feeling of fullness in the ear. °Ear pain. °Clicking or popping noises in the ear. °Ringing in the ear (tinnitus). °Hearing loss. °Loss of balance. °Dizziness. °Symptoms may get worse when the air pressure around you changes, such as when you travel to an area of high elevation, fly on an airplane, or go scuba diving. °How is this diagnosed? °This  condition may be diagnosed based on: °Your symptoms. °A physical exam of your ears, nose, and throat. °Tests, such as those that measure: °The movement of your eardrum. °Your hearing (audiometry). °How is this treated? °Treatment depends on the cause and severity of your condition. °In mild cases, you may relieve your symptoms by moving air into your ears. This is called "popping the ears." °In more severe cases, or if you have symptoms of fluid in your ears, treatment may include: °Medicines to relieve congestion (decongestants). °Medicines that treat allergies (antihistamines). °Nasal sprays or ear drops that contain medicines that reduce swelling (steroids). °A procedure to drain the fluid in your eardrum. In this procedure, a small tube may be placed in the eardrum to: °Drain the fluid. °Restore the air in the middle ear space. °A procedure to insert a balloon device through the nose to inflate the opening of the eustachian tube (balloon dilation). °Follow these instructions at home: °Lifestyle °Do not do any of the following until your health care provider approves: °Travel to high altitudes. °Fly in airplanes. °Work in a pressurized cabin or room. °Scuba dive. °Do not use any products that contain nicotine or tobacco. These products include cigarettes, chewing tobacco, and vaping devices, such as e-cigarettes. If you need help quitting, ask your health care provider. °Keep your ears dry. Wear fitted earplugs during showering and bathing. Dry your ears completely after. °General instructions °Take over-the-counter and prescription medicines only as told by your health care provider. °Use techniques to help pop your ears as recommended by your health care provider. These may include: °Chewing gum. °Yawning. °Frequent, forceful swallowing. °Closing   your mouth, holding your nose closed, and gently blowing as if you are trying to blow air out of your nose. °Keep all follow-up visits. This is important. °Contact a  health care provider if: °Your symptoms do not go away after treatment. °Your symptoms come back after treatment. °You are unable to pop your ears. °You have: °A fever. °Pain in your ear. °Pain in your head or neck. °Fluid draining from your ear. °Your hearing suddenly changes. °You become very dizzy. °You lose your balance. °Get help right away if: °You have a sudden, severe increase in any of your symptoms. °Summary °Eustachian tube dysfunction refers to a condition in which a blockage develops in the eustachian tube. °It can be caused by ear infections, allergies, inhaled irritants, or abnormal growths in the nose or throat. °Symptoms may include ear pain or fullness, hearing loss, or ringing in the ears. °Mild cases are treated with techniques to unblock the ears, such as yawning or chewing gum. °More severe cases are treated with medicines or procedures. °This information is not intended to replace advice given to you by your health care provider. Make sure you discuss any questions you have with your health care provider. °Document Revised: 12/12/2020 Document Reviewed: 12/12/2020 °Elsevier Patient Education © 2022 [employer] ° °

## 2021-07-13 NOTE — Progress Notes (Signed)
Established Patient Office Visit  Subjective:  Patient ID: Philip Daugherty, male    DOB: 1976-01-19  Age: 45 y.o. MRN: [id number]  CC:  Chief Complaint  Patient presents with   Follow-up   Medication Refill    HPI Philip Daugherty presents for follow up on ADD and mood management. Patient c/o bilateral clear-yellow ear drainage. No fever, otalgia, or bloody ear drainage. Hx of left ear drum rupture few months ago and states hearing has gradually improved. States has to forcefully pop his ears by holding his nose. Patient continues to work on weight loss with diet changes.  ADD: Patient reports Vyvanse is expensive and wants to resume Adderall XR for morning dose and Adderall in the afternoon.   Mood: Taking medication as directed without issues. Denies mood changes or SI/HI.  Testosterone: Reports has noticed improvement with fatigue since starting testosterone therapy once a week.     ED: Patient denies chest pain, increased heart rate or blood pressure when taking medication (sildenafil). Reports in the past tried 25 mg which seemed to work better.   Past Medical History:  Diagnosis Date   Acute pancreatitis 05/17/2016   Admitted to hosp while out of town in [place].  Likely secondary to ETOH.  GB/stone w/u NEG.   Allergy to cats    takes claritin   Asthma    childhood   Depression    wellbutrin helped in the past; pt cites failure to respond to prozac, zoloft, and celexa   Fear of flying    +insomnia; was on xanax regulary for long period and then abruptly stopped it and had w/drawal seizures.   Hypogonadism male 2012 and 2016   Managed by Dr. Loanne Drilling ([facility]) as of 04/2017, but this was only brief.  I restarted pt on testos IM at end of Oct 2019.   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 Era Bumpers)   Obesity, morbid, BMI 40.0-49.9 ([place])    Palpitations    24h Holter.: sinus tachycardia   Prediabetes 01/2017   A1c 6.3% ;  6.1% Dec 2018.   Syncope due to orthostatic hypotension    d/c'd from [employer] due to this.  Says if Wt 235 lbs or more then this is not a problem    Past Surgical History:  Procedure Laterality Date   EYE SURGERY Bilateral    lasik    Family History  Problem Relation Age of Onset   Depression Mother        bipolar disorder   Parkinsonism Mother    Diabetes Father    Other Father        low testosterone    Social History   Socioeconomic History   Marital status: Married    Spouse name: Not on file   Number of children: Not on file   Years of education: Not on file   Highest education level: Not on file  Occupational History   Not on file  Tobacco Use   Smoking status: Never   Smokeless tobacco: Never  Substance and Sexual Activity   Alcohol use: Yes    Comment: has decreased since 01/04/12   Drug use: No   Sexual activity: Not on file  Other Topics Concern   Not on file  Social History Narrative   Married, second marriage.  No children.   Orig from [place], spent time in [place], college in [place].   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   No tobacco.  Alcohol: 3-4 bourbon drinks most days, cut back as of 12/2011.   Exercise: sporadic (elliptical, walking).  No hx of drug use/abuse.      Social Determinants of Health   Financial Resource Strain: Not on file  Food Insecurity: Not on file  Transportation Needs: Not on file  Physical Activity: Not on file  Stress: Not on file  Social Connections: Not on file  Intimate Partner Violence: Not on file    Outpatient Medications Prior to Visit  Medication Sig Dispense Refill   albuterol (VENTOLIN HFA) 108 (90 Base) MCG/ACT inhaler Inhale 2 puffs into the lungs every 6 (six) hours as needed for wheezing or shortness of breath. 8 g 0   buPROPion (WELLBUTRIN XL) 150 MG 24 hr tablet Take 1  tablet (150 mg total) by mouth every morning. 90 tablet 0   DULoxetine (CYMBALTA) 60 MG capsule Take 1 capsule (60 mg total) by mouth daily. 90 capsule 0   Semaglutide-Weight Management 0.25 MG/0.5ML SOAJ Inject 0.25 mg into the skin once a week. 2 mL 0   sildenafil (VIAGRA) 25 MG tablet Take 1 tablet (25 mg total) by mouth daily as needed for erectile dysfunction. 10 tablet 0   lisdexamfetamine (VYVANSE) 10 MG capsule TAKE 1 CAPSULE BY MOUTH IN THE MORNING. 30 capsule 0   methylphenidate (RITALIN) 10 MG tablet TAKE 1 TABLET BY MOUTH 2 TIMES DAILY. 60 tablet 0   testosterone cypionate (DEPOTESTOSTERONE CYPIONATE) 200 MG/ML injection INJECT 1 ML INTO THE MUSCLE EVERY 14 DAYS. 10 mL 0   No facility-administered medications prior to visit.    No Known Allergies  ROS Review of Systems A fourteen system review of systems was performed and found to be positive as per HPI.   Objective:    Physical Exam General:  Well Developed, well nourished, appropriate for stated age.  Neuro:  Alert and oriented,  extra-ocular muscles intact  HEENT:  Normocephalic, atraumatic, normal conjunctiva, scar tissue of left ear noted, excessive hair cells of right ear limit view- no erythema or opaque TM appreciated +mildly bulging TM, no ear wax or drainage of both ears present, neck supple, no adenopathy  Skin:  no gross rash, warm, pink. Cardiac:  RRR, S1 S2 Respiratory:  ECTA B/L and A/P, Not using accessory muscles, speaking in full sentences- unlabored. Vascular:  Ext warm, no cyanosis apprec.; cap RF less 2 sec. Psych:  No HI/SI, judgement and insight good, Euthymic mood. Full Affect.  BP 130/84   Pulse 90   Temp 98.7 F (37.1 C)   Ht 6' 1"  (1.854 m)   Wt (!) 307 lb 11.2 oz (139.6 kg)   SpO2 97%   BMI 40.60 kg/m  Wt Readings from Last 3 Encounters:  07/13/21 (!) 307 lb 11.2 oz (139.6 kg)  02/20/21 (!) 318 lb 12.8 oz (144.6 kg)  11/22/20 (!) 326 lb 1.6 oz (147.9 kg)     Health Maintenance Due   Topic Date Due   Hepatitis C Screening  Never done   COVID-19 Vaccine (3 - Booster for [place] series) 06/24/2020    There are no preventive care reminders to display for this patient.  Lab Results  Component Value Date   TSH 2.540 08/22/2020   Lab Results  Component Value Date   WBC 7.9 02/21/2021   HGB 16.1 02/21/2021   HCT 47.8 02/21/2021  MCV 85 02/21/2021   PLT 381 02/21/2021   Lab Results  Component Value Date   NA 139 02/21/2021   K 4.8 02/21/2021   CO2 25 02/21/2021   GLUCOSE 103 (H) 02/21/2021   BUN 10 02/21/2021   CREATININE 1.16 02/21/2021   BILITOT 0.5 02/21/2021   ALKPHOS 92 02/21/2021   AST 22 02/21/2021   ALT 25 02/21/2021   PROT 7.0 02/21/2021   ALBUMIN 4.6 02/21/2021   CALCIUM 10.0 02/21/2021   ANIONGAP 9 01/10/2021   EGFR 80 02/21/2021   GFR 101.42 10/01/2017   Lab Results  Component Value Date   CHOL 196 08/22/2020   Lab Results  Component Value Date   HDL 38 (L) 08/22/2020   Lab Results  Component Value Date   LDLCALC 132 (H) 08/22/2020   Lab Results  Component Value Date   TRIG 146 08/22/2020   Lab Results  Component Value Date   CHOLHDL 5.2 (H) 08/22/2020   Lab Results  Component Value Date   HGBA1C 6.1 (H) 08/22/2020      Assessment & Plan:   Problem List Items Addressed This Visit       Endocrine   Hypogonadism, male   Relevant Medications   testosterone cypionate (DEPOTESTOSTERONE CYPIONATE) 200 MG/ML injection     Other   Major depressive disorder, recurrent episode, moderate (HCC)   Attention deficit disorder (ADD) without hyperactivity - Primary   Relevant Medications   amphetamine-dextroamphetamine (ADDERALL XR) 30 MG 24 hr capsule   amphetamine-dextroamphetamine (ADDERALL) 10 MG tablet   GAD (generalized anxiety disorder)   Other Visit Diagnoses     Need for influenza vaccination       Relevant Orders   Flu Vaccine QUAD 67moIM (Fluarix, Fluzone & Alfiuria Quad PF) (Completed)   Otorrhea of both ears        Relevant Orders   Ambulatory referral to ENT   Erectile dysfunction, unspecified erectile dysfunction type           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 Discussed potential side effects and advised to let me know if unable to tolerate medications. -Will update controlled substance contract at follow up visit if new regimen effective. -Follow up in 3 months for medication management.  MDD, recurrent episode, moderate, GAD: -Stable. -Continue current medication therapy. -Will continue to monitor.  Male hypogonadism: -Advised patient to schedule lab visit early in the morning to repeat testosterone labs. Pending lab results will adjust testosterone replacement therapy if indicated. Testosterone  Date Value Ref Range Status  02/21/2021 307 264 - 916 ng/dL Final    Comment:    Adult male reference interval is based on a population of healthy nonobese males (BMI <30) between 137and 45years old. [id number] PMID: [id number]   11/24/2020 89 (L) 264 - 916 ng/dL Final    Comment:    Adult male reference interval is based on a population of healthy nonobese males (BMI <30) between 159and 45years old. [phone] PMID: [id number]   08/22/2020 92 (L) 264 - 916 ng/dL Final    Comment:    Adult male reference interval is based on a population of healthy nonobese males (BMI <30) between 163and 45years old. [id number] PMID: [id number]    Testosterone, Free  Date Value Ref Range Status  08/22/2020 6.4 (L) 6.8 - 21.5 pg/mL Final  07/24/2018 See below  46.0 - 224.0 pg/mL Final  04/10/2017 4.6 (L) 6.8 - 21.5 pg/mL Final   ED: -Advised patient can trial sildenafil 50 mg and monitor for side effects.  Otorrhea of both ears: -Will place referral to ENT for further evaluation. No s/s present concerning for otitis  media, will defer antibiotic therapy.  Meds ordered this encounter  Medications   amphetamine-dextroamphetamine (ADDERALL XR) 30 MG 24 hr capsule    Sig: Take 1 capsule (30 mg total) by mouth every morning.    Dispense:  30 capsule    Refill:  0    Order Specific Question:   Supervising Provider    Answer:   Beatrice Lecher D [[id number]]   amphetamine-dextroamphetamine (ADDERALL) 10 MG tablet    Sig: Take 1-2 tablets by mouth in the afternoon.    Dispense:  60 tablet    Refill:  0    Order Specific Question:   Supervising Provider    Answer:   Beatrice Lecher D [[id number]]   testosterone cypionate (DEPOTESTOSTERONE CYPIONATE) 200 MG/ML injection    Sig: Inject 1 mL (200 mg total) into the muscle once a week.    Dispense:  10 mL    Refill:  0    Order Specific Question:   Supervising Provider    Answer:   Beatrice Lecher D [[id number]]    Follow-up: Return in about 3 months (around 10/12/2021) for Mood, ADD; lab visit in 1-2 weeks for testosterone, cbc, cmp.    Lorrene Reid, PA-C

## 2021-07-14 ENCOUNTER — Encounter: Payer: Self-pay | Admitting: Physician Assistant

## 2021-07-17 ENCOUNTER — Other Ambulatory Visit: Payer: Self-pay | Admitting: Physician Assistant

## 2021-07-17 ENCOUNTER — Other Ambulatory Visit: Payer: Self-pay

## 2021-07-17 DIAGNOSIS — E291 Testicular hypofunction: Secondary | ICD-10-CM

## 2021-07-17 MED ORDER — TESTOSTERONE CYPIONATE 200 MG/ML IM SOLN: 200.0000 mg | INTRAMUSCULAR | 0 refills | Status: DC

## 2021-07-17 MED ORDER — TESTOSTERONE CYPIONATE 200 MG/ML IM SOLN
200.0000 mg | INTRAMUSCULAR | 0 refills | Status: DC
Start: 2021-07-17 — End: 2021-07-17

## 2021-07-19 ENCOUNTER — Other Ambulatory Visit: Payer: Self-pay

## 2021-07-19 DIAGNOSIS — E291 Testicular hypofunction: Secondary | ICD-10-CM

## 2021-07-19 MED ORDER — TESTOSTERONE CYPIONATE 200 MG/ML IM SOLN: 200.0000 mg | INTRAMUSCULAR | 0 refills | Status: DC

## 2021-07-21 ENCOUNTER — Other Ambulatory Visit: Payer: Self-pay | Admitting: Physician Assistant

## 2021-07-21 DIAGNOSIS — F411 Generalized anxiety disorder: Secondary | ICD-10-CM

## 2021-07-21 DIAGNOSIS — F322 Major depressive disorder, single episode, severe without psychotic features: Secondary | ICD-10-CM

## 2021-08-01 DIAGNOSIS — L299 Pruritus, unspecified: Secondary | ICD-10-CM | POA: Diagnosis not present

## 2021-08-01 DIAGNOSIS — L309 Dermatitis, unspecified: Secondary | ICD-10-CM | POA: Insufficient documentation

## 2021-08-23 ENCOUNTER — Other Ambulatory Visit: Payer: Self-pay | Admitting: Physician Assistant

## 2021-08-23 DIAGNOSIS — F322 Major depressive disorder, single episode, severe without psychotic features: Secondary | ICD-10-CM

## 2021-08-23 DIAGNOSIS — F988 Other specified behavioral and emotional disorders with onset usually occurring in childhood and adolescence: Secondary | ICD-10-CM

## 2021-08-23 DIAGNOSIS — F411 Generalized anxiety disorder: Secondary | ICD-10-CM

## 2021-09-21 ENCOUNTER — Other Ambulatory Visit: Payer: Self-pay | Admitting: Physician Assistant

## 2021-09-21 DIAGNOSIS — F411 Generalized anxiety disorder: Secondary | ICD-10-CM

## 2021-09-21 DIAGNOSIS — F322 Major depressive disorder, single episode, severe without psychotic features: Secondary | ICD-10-CM

## 2021-09-21 DIAGNOSIS — F988 Other specified behavioral and emotional disorders with onset usually occurring in childhood and adolescence: Secondary | ICD-10-CM

## 2021-09-22 ENCOUNTER — Encounter: Payer: Self-pay | Admitting: Physician Assistant

## 2021-10-10 ENCOUNTER — Ambulatory Visit (INDEPENDENT_AMBULATORY_CARE_PROVIDER_SITE_OTHER): Payer: BC Managed Care – PPO | Admitting: Physician Assistant

## 2021-10-10 ENCOUNTER — Other Ambulatory Visit: Payer: Self-pay

## 2021-10-10 ENCOUNTER — Encounter: Payer: Self-pay | Admitting: Physician Assistant

## 2021-10-10 VITALS — BP 122/85 | HR 106 | Temp 92.2°F | Ht 73.0 in | Wt 305.0 lb

## 2021-10-10 DIAGNOSIS — F331 Major depressive disorder, recurrent, moderate: Secondary | ICD-10-CM | POA: Diagnosis not present

## 2021-10-10 DIAGNOSIS — G479 Sleep disorder, unspecified: Secondary | ICD-10-CM

## 2021-10-10 DIAGNOSIS — F988 Other specified behavioral and emotional disorders with onset usually occurring in childhood and adolescence: Secondary | ICD-10-CM | POA: Diagnosis not present

## 2021-10-10 DIAGNOSIS — J019 Acute sinusitis, unspecified: Secondary | ICD-10-CM

## 2021-10-10 DIAGNOSIS — F418 Other specified anxiety disorders: Secondary | ICD-10-CM

## 2021-10-10 DIAGNOSIS — R051 Acute cough: Secondary | ICD-10-CM | POA: Diagnosis not present

## 2021-10-10 LAB — POCT INFLUENZA A/B
Influenza A, POC: NEGATIVE
Influenza B, POC: NEGATIVE

## 2021-10-10 LAB — POCT RAPID STREP A (OFFICE): Rapid Strep A Screen: NEGATIVE

## 2021-10-10 MED ORDER — BUPROPION HCL ER (XL) 300 MG PO TB24: 300.0000 mg | ORAL_TABLET | Freq: Every day | ORAL | 0 refills | Status: DC

## 2021-10-10 MED ORDER — ALPRAZOLAM 0.5 MG PO TABS: 0.5000 mg | ORAL_TABLET | Freq: Two times a day (BID) | ORAL | 0 refills | Status: DC | PRN

## 2021-10-10 NOTE — Assessment & Plan Note (Addendum)
-  Stable. -Continue current medication regimen. -PDMP reviewed, no aberrancies noted. Not due for med refills yet. -Will continue to monitor.

## 2021-10-10 NOTE — Progress Notes (Signed)
a  Established Patient Office Visit  Subjective:  Patient ID: Philip Daugherty, male    DOB: 09-13-1976  Age: 45 y.o. MRN: [id number]  CC:  Chief Complaint  Patient presents with   Medication Refill    HPI Philip Daugherty presents to discuss Xanax medication. Reports in the past has taken medication when needed for flying especially for international travel which disrupts his sleep. Patient also has c/o productive cough with light green sputum, sore throat, sneezing, chills, n/v/d, postnasal drainage and fatigue. Runny nose and nasal congestion have improved. Denies fever, chest pain or palpitations. Denies sick contact exposures. Has tried DayQuil and Nyquil which have provided some relief. Symptoms started last Thursday (5 days ago).  ADHD: No concerns. Taking medications without issues.  Mood: States feels like medications are not working as well as before. Has decreased interest than usual. Reports medication compliance. Denies SI/HI.   Past Medical History:  Diagnosis Date   Acute pancreatitis 05/17/2016   Admitted to hosp while out of town in [place].  Likely secondary to ETOH.  GB/stone w/u NEG.   Allergy to cats    takes claritin   Asthma    childhood   Depression    wellbutrin helped in the past; pt cites failure to respond to prozac, zoloft, and celexa   Fear of flying    +insomnia; was on xanax regulary for long period and then abruptly stopped it and had w/drawal seizures.   Hypogonadism male 2012 and 2016   Managed by Dr. Loanne Drilling ([facility]) as of 04/2017, but this was only brief.  I restarted pt on testos IM at end of Oct 2019.   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 Era Bumpers)   Obesity, morbid, BMI 40.0-49.9 ([place])    Palpitations    24h Holter.: sinus tachycardia   Prediabetes 01/2017   A1c 6.3% ; 6.1% Dec 2018.   Syncope due to orthostatic hypotension    d/c'd from [employer] due to this.  Says if Wt 235 lbs or more  then this is not a problem    Past Surgical History:  Procedure Laterality Date   EYE SURGERY Bilateral    lasik    Family History  Problem Relation Age of Onset   Depression Mother        bipolar disorder   Parkinsonism Mother    Diabetes Father    Other Father        low testosterone    Social History   Socioeconomic History   Marital status: Married    Spouse name: Not on file   Number of children: Not on file   Years of education: Not on file   Highest education level: Not on file  Occupational History   Not on file  Tobacco Use   Smoking status: Never   Smokeless tobacco: Never  Substance and Sexual Activity   Alcohol use: Yes    Comment: has decreased since 01/04/12   Drug use: No   Sexual activity: Not on file  Other Topics Concern   Not on file  Social History Narrative   Married, second marriage.  No children.   Orig from [place], spent time in [place], college in [place].   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   No tobacco.  Alcohol: 3-4 bourbon drinks most days, cut back as  of 12/2011.   Exercise: sporadic (elliptical, walking).  No hx of drug use/abuse.      Social Determinants of Health   Financial Resource Strain: Not on file  Food Insecurity: Not on file  Transportation Needs: Not on file  Physical Activity: Not on file  Stress: Not on file  Social Connections: Not on file  Intimate Partner Violence: Not on file    Outpatient Medications Prior to Visit  Medication Sig Dispense Refill   albuterol (VENTOLIN HFA) 108 (90 Base) MCG/ACT inhaler Inhale 2 puffs into the lungs every 6 (six) hours as needed for wheezing or shortness of breath. 8 g 0   amphetamine-dextroamphetamine (ADDERALL XR) 30 MG 24 hr capsule TAKE 1 CAPSULE (30 MG TOTAL) BY MOUTH EVERY MORNING. 30 capsule 0   amphetamine-dextroamphetamine  (ADDERALL) 10 MG tablet TAKE 1 TO 2 TABLETS BY MOUTH IN THE AFTERNOON. 60 tablet 0   DULoxetine (CYMBALTA) 60 MG capsule TAKE 1 CAPSULE (60 MG TOTAL) BY MOUTH DAILY. 90 capsule 0   sildenafil (VIAGRA) 25 MG tablet Take 1 tablet (25 mg total) by mouth daily as needed for erectile dysfunction. 10 tablet 0   testosterone cypionate (DEPOTESTOSTERONE CYPIONATE) 200 MG/ML injection Inject 1 mL (200 mg total) into the muscle once a week. 10 mL 0   Semaglutide-Weight Management 0.25 MG/0.5ML SOAJ Inject 0.25 mg into the skin once a week. 2 mL 0   buPROPion (WELLBUTRIN XL) 150 MG 24 hr tablet TAKE 1 TABLET (150 MG TOTAL) BY MOUTH EVERY MORNING. 30 tablet 0   No facility-administered medications prior to visit.    No Known Allergies  ROS Review of Systems Review of Systems:  A fourteen system review of systems was performed and found to be positive as per HPI.   Objective:    Physical Exam General:  Well Developed, well nourished, appropriate for stated age.  Neuro:  Alert and oriented,  extra-ocular muscles intact  HEENT:  Normocephalic, atraumatic, frontal sinus tenderness, PERRL, normal TM's of both ears, slightly boggy turbinates, erythema of posterior oropharynx, no exudate, neck supple, no adenopathy  Skin:  no gross rash, warm, pink. Cardiac:  RRR, S1 S2 Respiratory: CTA B/L w/o wheezing, crackles or rales. Vascular:  Ext warm, no cyanosis apprec.; cap RF less 2 sec. Psych:  No HI/SI, judgement and insight good, Euthymic mood. Full Affect.  BP 122/85    Pulse (!) 106    Temp (!) 92.2 F (33.4 C)    Ht 6' 1"  (1.854 m)    Wt (!) 305 lb (138.3 kg)    SpO2 98%    BMI 40.24 kg/m  Wt Readings from Last 3 Encounters:  10/10/21 (!) 305 lb (138.3 kg)  07/13/21 (!) 307 lb 11.2 oz (139.6 kg)  02/20/21 (!) 318 lb 12.8 oz (144.6 kg)     Health Maintenance Due  Topic Date Due   Pneumococcal Vaccine 3-70 Years old (1 - PCV) Never done   Hepatitis C Screening  Never done   COVID-19 Vaccine  (3 - Booster for Pf[employer] series) 03/19/2020   COLONOSCOPY (Pts 45-4yr Insurance coverage will need to be confirmed)  Never done    There are no preventive care reminders to display for this patient.  Lab Results  Component Value Date   TSH 2.540 08/22/2020   Lab Results  Component Value Date   WBC 7.9 02/21/2021   HGB 16.1 02/21/2021   HCT 47.8 02/21/2021   MCV 85 02/21/2021   PLT 381 02/21/2021  Lab Results  Component Value Date   NA 139 02/21/2021   K 4.8 02/21/2021   CO2 25 02/21/2021   GLUCOSE 103 (H) 02/21/2021   BUN 10 02/21/2021   CREATININE 1.16 02/21/2021   BILITOT 0.5 02/21/2021   ALKPHOS 92 02/21/2021   AST 22 02/21/2021   ALT 25 02/21/2021   PROT 7.0 02/21/2021   ALBUMIN 4.6 02/21/2021   CALCIUM 10.0 02/21/2021   ANIONGAP 9 01/10/2021   EGFR 80 02/21/2021   GFR 101.42 10/01/2017   Lab Results  Component Value Date   CHOL 196 08/22/2020   Lab Results  Component Value Date   HDL 38 (L) 08/22/2020   Lab Results  Component Value Date   LDLCALC 132 (H) 08/22/2020   Lab Results  Component Value Date   TRIG 146 08/22/2020   Lab Results  Component Value Date   CHOLHDL 5.2 (H) 08/22/2020   Lab Results  Component Value Date   HGBA1C 6.1 (H) 08/22/2020   Depression screen PHQ 2/9 10/10/2021 07/13/2021 02/20/2021 11/22/2020 08/22/2020  Decreased Interest 1 0 0 0 1  Down, Depressed, Hopeless 0 0 0 0 0  PHQ - 2 Score 1 0 0 0 1  Altered sleeping 3 3 1 3 3   Tired, decreased energy 3 3 1 3 3   Change in appetite 3 1 0 1 0  Feeling bad or failure about yourself  0 0 0 0 0  Trouble concentrating 1 1 0 0 0  Moving slowly or fidgety/restless 0 0 0 0 0  Suicidal thoughts 0 0 0 0 0  PHQ-9 Score 11 8 2 7 7   Difficult doing work/chores Somewhat difficult Somewhat difficult - - Very difficult  Some recent data might be hidden   GAD 7 : Generalized Anxiety Score 10/10/2021 07/13/2021 03/11/2019  Nervous, Anxious, on Edge 0 0 0  Control/stop worrying 0 0 0   Worry too much - different things 0 0 0  Trouble relaxing 0 0 0  Restless 0 0 0  Easily annoyed or irritable 0 0 0  Afraid - awful might happen 0 0 0  Total GAD 7 Score 0 0 0  Anxiety Difficulty Not difficult at all Not difficult at all Not difficult at all        Assessment & Plan:   Problem List Items Addressed This Visit       Other   Situational anxiety   Relevant Medications   buPROPion (WELLBUTRIN XL) 300 MG 24 hr tablet   ALPRAZolam (XANAX) 0.5 MG tablet   Major depressive disorder, recurrent episode, moderate (HCC)    -PHQ-9 score of 11, mildly increased from prior. Discussed with patient treatment adjustments and agreeable to increase Wellbutrin XL to 300 mg. Continue Duloxetine 60 mg daily. Recommend to let me know if unable to tolerate increased dose or ineffective. -Will continue to monitor.      Relevant Medications   buPROPion (WELLBUTRIN XL) 300 MG 24 hr tablet   ALPRAZolam (XANAX) 0.5 MG tablet   Attention deficit disorder (ADD) without hyperactivity    -Stable. -Continue current medication regimen. -PDMP reviewed, no aberrancies noted. Not due for med refills yet. -Will continue to monitor.      Other Visit Diagnoses     Acute cough    -  Primary   Relevant Orders   POCT rapid strep A (Completed)   POCT Influenza A/B (Completed)   Novel Coronavirus, NAA ([facility])   Sleep disturbance  Relevant Medications   ALPRAZolam (XANAX) 0.5 MG tablet       Sleep disturbance, situational anxiety: -Reviewed prior medication hx, last rx for alprazolam was 07/25/2018. PDMP reviewed, no aberrancies noted. Discussed with patient controlled substance policy (benzodiazepines) and potential medication side effects. Patient made verbal contract for alprazolam 0.5 mg BID prn for sleep/anxiety Q: 30 tablets every 6 months as needed.   Acute cough: -Influenza and rapid strep resulted negative. Covid-19 test pending. Discussed with patient if Covid-19 test  results negative then will consider starting antibiotic therapy for bacterial sinusitis due to worsening symptoms and has signs associated with sinusitis. Recommend to continue home supportive care.   Meds ordered this encounter  Medications   buPROPion (WELLBUTRIN XL) 300 MG 24 hr tablet    Sig: Take 1 tablet (300 mg total) by mouth daily.    Dispense:  90 tablet    Refill:  0    Order Specific Question:   Supervising Provider    Answer:   Beatrice Lecher D [[id number]]   ALPRAZolam (XANAX) 0.5 MG tablet    Sig: Take 1 tablet (0.5 mg total) by mouth 2 (two) times daily as needed for anxiety or sleep.    Dispense:  30 tablet    Refill:  0    Order Specific Question:   Supervising Provider    Answer:   Beatrice Lecher D [[id number]]    Follow-up: Return in about 3 months (around 01/08/2022) for Mood- inc med, ADHD .    Lorrene Reid, PA-C

## 2021-10-10 NOTE — Assessment & Plan Note (Signed)
-  PHQ-9 score of 11, mildly increased from prior. Discussed with patient treatment adjustments and agreeable to increase Wellbutrin XL to 300 mg. Continue Duloxetine 60 mg daily. Recommend to let me know if unable to tolerate increased dose or ineffective. -Will continue to monitor.

## 2021-10-11 LAB — SARS-COV-2, NAA 2 DAY TAT

## 2021-10-11 LAB — NOVEL CORONAVIRUS, NAA: SARS-CoV-2, NAA: NOT DETECTED

## 2021-10-11 MED ORDER — AZITHROMYCIN 250 MG PO TABS: ORAL_TABLET | ORAL | 0 refills | Status: AC

## 2021-10-11 NOTE — Addendum Note (Signed)
Addended by: Lorrene Reid on: 10/11/2021 03:00 PM   Modules accepted: Orders

## 2021-10-13 LAB — BASIC METABOLIC PANEL
Alkaline Phosphatase: 92
BUN/Creatinine Ratio: 12
BUN: 13 (ref 4–21)
Bilirubin: 0.4
Calcium: 9.8
Carbon Dioxide, Total: 25
Chloride: 101 (ref 99–108)
Creatinine: 1.1 (ref 0.6–1.3)
GGT: 59
Glucose: 101
Iron: 89
Phosphorus: 3.1
Potassium: 4.6 (ref 3.4–5.3)
Sodium: 140 (ref 137–147)
Total Protein: 7.1 g/dL
Uric Acid: 6.4
eGFR: 83

## 2021-10-13 LAB — LIPID PANEL
Chol/HDL Ratio: 5.1
Cholesterol: 175 (ref 0–200)
HDL: 34 — AB (ref 35–70)
LDL Cholesterol: 118
Triglycerides: 127 (ref 40–160)

## 2021-10-13 LAB — HEPATIC FUNCTION PANEL
ALT: 60 — AB (ref 10–40)
AST: 36 (ref 14–40)
Alkaline Phosphatase: 92 (ref 25–125)
Bilirubin, Direct: 0.11 (ref 0.01–0.4)
Bilirubin, Total: 0.4

## 2021-10-13 LAB — CBC AND DIFFERENTIAL
HCT: 50 (ref 41–53)
Hemoglobin: 17.2 (ref 13.5–17.5)
MCH: 29.4
MCHC: 34.3
MCV: 86 (ref 76–111)
Platelets: 349 (ref 150–399)
RBC: 5.85 — AB (ref 3.87–5.11)
RDW: 12.7
WBC: 7.6

## 2021-10-13 LAB — COMPREHENSIVE METABOLIC PANEL
Albumin: 4.6 (ref 3.5–5.0)
Calcium: 9.8 (ref 8.7–10.7)
Globulin: 2.5

## 2021-10-13 LAB — CBC: RBC: 5.85 — AB (ref 3.87–5.11)

## 2021-10-18 ENCOUNTER — Encounter: Payer: Self-pay | Admitting: Physician Assistant

## 2021-10-26 ENCOUNTER — Encounter: Payer: Self-pay | Admitting: Physician Assistant

## 2021-11-02 ENCOUNTER — Ambulatory Visit (INDEPENDENT_AMBULATORY_CARE_PROVIDER_SITE_OTHER): Payer: BC Managed Care – PPO | Admitting: Physician Assistant

## 2021-11-02 ENCOUNTER — Encounter: Payer: Self-pay | Admitting: Physician Assistant

## 2021-11-02 ENCOUNTER — Other Ambulatory Visit: Payer: Self-pay

## 2021-11-02 VITALS — BP 134/84 | HR 88 | Ht 73.0 in | Wt 298.0 lb

## 2021-11-02 DIAGNOSIS — F331 Major depressive disorder, recurrent, moderate: Secondary | ICD-10-CM | POA: Diagnosis not present

## 2021-11-02 DIAGNOSIS — F411 Generalized anxiety disorder: Secondary | ICD-10-CM

## 2021-11-02 MED ORDER — HYDROXYZINE PAMOATE 25 MG PO CAPS: 25.0000 mg | ORAL_CAPSULE | Freq: Four times a day (QID) | ORAL | 0 refills | Status: DC | PRN

## 2021-11-02 MED ORDER — DULOXETINE HCL 20 MG PO CPEP: ORAL_CAPSULE | ORAL | 0 refills | Status: DC

## 2021-11-02 MED ORDER — PAROXETINE HCL 10 MG PO TABS: 10.0000 mg | ORAL_TABLET | Freq: Every day | ORAL | 1 refills | Status: DC

## 2021-11-02 NOTE — Progress Notes (Signed)
Established Patient Office Visit  Subjective:  Patient ID: Philip Daugherty, male    DOB: 12/27/1975  Age: 46 y.o. MRN: [id number]  CC:  Chief Complaint  Patient presents with   Acute Visit   Depression    HPI Philip Daugherty presents for discuss worsening depression and anxiety. Patient sent a mychart message 10/26/2021 describing feeling scared and suicidal thoughts with a plan to cut himself with a razor blade but was unable to follow through. Patient is accompanied by his wife today. Patient just came back from a personal trip yesterday. Patient states has traveled to [place] before but not to [place] which is a much longer flight. Patient reports accommodations during the plane towards [place] were not so great. Patient reports was anxious about the trip as it was approaching. Patient's wife reports lack of sleep did not help the situation either. States she noticed the he had no control over certain things such as checking plane status through his phone and at the airport which made his anxiety worse. Patient did not have enough of his Duloxetine 60 mg and took the medication every other day to help it last. Patient has two days of suicidal thoughts and did reach out to the [place] crisis line as advised by our office which helped get through those tough moments. Patient states has noticed when taking Xanax that his mood tends to spiral and becomes angry. In the past he had similar symptoms when his also took Xanax and Klonopin. States did not connect the dots until now about how the medication affected him negatively. Also reports mental fog. Patient's wife has also noticed he seems more depressed because not motivated to do things and decreased appetite. Patient states has reached out to his therapist and is waiting to hear back from them. In the past was referred to psychiatry but does not want to go back to the same location. Feels like Duloxetine isn't as effective as before and cannot really notice  a difference with medication. In the past was on sertraline which was ineffective.    Past Medical History:  Diagnosis Date   Acute pancreatitis 05/17/2016   Admitted to hosp while out of town in [place].  Likely secondary to ETOH.  GB/stone w/u NEG.   Allergy to cats    takes claritin   Asthma    childhood   Depression    wellbutrin helped in the past; pt cites failure to respond to prozac, zoloft, and celexa   Fear of flying    +insomnia; was on xanax regulary for long period and then abruptly stopped it and had w/drawal seizures.   Hypogonadism male 2012 and 2016   Managed by Dr. Loanne Drilling ([facility]) as of 04/2017, but this was only brief.  I restarted pt on testos IM at end of Oct 2019.   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 Era Bumpers)   Obesity, morbid, BMI 40.0-49.9 ([place])    Palpitations    24h Holter.: sinus tachycardia   Prediabetes 01/2017   A1c 6.3% ; 6.1% Dec 2018.   Syncope due to orthostatic hypotension    d/c'd from [employer] due to this.  Says if Wt 235 lbs or more then this is not a problem    Past Surgical History:  Procedure Laterality Date   EYE SURGERY Bilateral    lasik    Family History  Problem Relation Age of Onset   Depression Mother  bipolar disorder   Parkinsonism Mother    Diabetes Father    Other Father        low testosterone    Social History   Socioeconomic History   Marital status: Married    Spouse name: Not on file   Number of children: Not on file   Years of education: Not on file   Highest education level: Not on file  Occupational History   Not on file  Tobacco Use   Smoking status: Never   Smokeless tobacco: Never  Substance and Sexual Activity   Alcohol use: Yes    Comment: has decreased since 01/04/12   Drug use: No   Sexual activity: Not on file  Other Topics Concern   Not on file  Social History Narrative   Married, second marriage.  No children.   Orig from [place],  spent time in [place], college in [place].   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   No tobacco.  Alcohol: 3-4 bourbon drinks most days, cut back as of 12/2011.   Exercise: sporadic (elliptical, walking).  No hx of drug use/abuse.      Social Determinants of Health   Financial Resource Strain: Not on file  Food Insecurity: Not on file  Transportation Needs: Not on file  Physical Activity: Not on file  Stress: Not on file  Social Connections: Not on file  Intimate Partner Violence: Not on file    Outpatient Medications Prior to Visit  Medication Sig Dispense Refill   albuterol (VENTOLIN HFA) 108 (90 Base) MCG/ACT inhaler Inhale 2 puffs into the lungs every 6 (six) hours as needed for wheezing or shortness of breath. 8 g 0   amphetamine-dextroamphetamine (ADDERALL XR) 30 MG 24 hr capsule TAKE 1 CAPSULE (30 MG TOTAL) BY MOUTH EVERY MORNING. 30 capsule 0   amphetamine-dextroamphetamine (ADDERALL) 10 MG tablet TAKE 1 TO 2 TABLETS BY MOUTH IN THE AFTERNOON. 60 tablet 0   buPROPion (WELLBUTRIN XL) 300 MG 24 hr tablet Take 1 tablet (300 mg total) by mouth daily. 90 tablet 0   DULoxetine (CYMBALTA) 60 MG capsule TAKE 1 CAPSULE (60 MG TOTAL) BY MOUTH DAILY. 90 capsule 0   sildenafil (VIAGRA) 25 MG tablet Take 1 tablet (25 mg total) by mouth daily as needed for erectile dysfunction. 10 tablet 0   testosterone cypionate (DEPOTESTOSTERONE CYPIONATE) 200 MG/ML injection Inject 1 mL (200 mg total) into the muscle once a week. 10 mL 0   ALPRAZolam (XANAX) 0.5 MG tablet Take 1 tablet (0.5 mg total) by mouth 2 (two) times daily as needed for anxiety or sleep. 30 tablet 0   No facility-administered medications prior to visit.    No Known Allergies  ROS Review of Systems Review of Systems:  A fourteen system review of systems was performed and found to be positive as  per HPI.   Objective:    Physical Exam General:  Well Developed, well nourished, appropriate for stated age.  Neuro:  Alert and oriented,  extra-ocular muscles intact  HEENT:  Normocephalic, atraumatic, neck supple, no carotid bruits appreciated  Skin:  no gross rash, warm, pink. Cardiac:  RRR, S1 S2 Respiratory: CTA B/L Vascular:  Ext warm, no cyanosis apprec.; cap RF less 2 sec. Psych:  No HI/SI, judgement and insight good, Euthymic mood. Full Affect.  BP 134/84    Pulse 88    Ht  6' 1"  (1.854 m)    Wt 298 lb (135.2 kg)    SpO2 98%    BMI 39.32 kg/m  Wt Readings from Last 3 Encounters:  11/02/21 298 lb (135.2 kg)  10/10/21 (!) 305 lb (138.3 kg)  07/13/21 (!) 307 lb 11.2 oz (139.6 kg)     Health Maintenance Due  Topic Date Due   Hepatitis C Screening  Never done   COVID-19 Vaccine (3 - Booster for [place] series) 03/19/2020   COLONOSCOPY (Pts 45-39yr Insurance coverage will need to be confirmed)  Never done    There are no preventive care reminders to display for this patient.  Lab Results  Component Value Date   TSH 2.540 08/22/2020   Lab Results  Component Value Date   WBC 7.6 10/13/2021   HGB 17.2 10/13/2021   HCT 50 10/13/2021   MCV 86 10/13/2021   PLT 349 10/13/2021   Lab Results  Component Value Date   NA 140 10/13/2021   K 4.6 10/13/2021   CO2 25 10/13/2021   GLUCOSE 103 (H) 02/21/2021   BUN 13 10/13/2021   CREATININE 1.1 10/13/2021   BILITOT 0.5 02/21/2021   ALKPHOS 92 10/13/2021   ALKPHOS 92 10/13/2021   AST 36 10/13/2021   ALT 60 (A) 10/13/2021   PROT 7.1 10/13/2021   ALBUMIN 4.6 10/13/2021   CALCIUM 9.8 10/13/2021   CALCIUM 9.8 10/13/2021   ANIONGAP 9 01/10/2021   EGFR 83 10/13/2021   GFR 101.42 10/01/2017   Lab Results  Component Value Date   CHOL 175 10/13/2021   Lab Results  Component Value Date   HDL 34 (A) 10/13/2021   Lab Results  Component Value Date   LDLCALC 118 10/13/2021   Lab Results  Component Value Date   TRIG 127  10/13/2021   Lab Results  Component Value Date   CHOLHDL 5.1 10/13/2021   Lab Results  Component Value Date   HGBA1C 6.1 (H) 08/22/2020   Depression screen PHQ 2/9 11/02/2021 10/10/2021 07/13/2021 02/20/2021 11/22/2020  Decreased Interest 2 1 0 0 0  Down, Depressed, Hopeless 2 0 0 0 0  PHQ - 2 Score 4 1 0 0 0  Altered sleeping 3 3 3 1 3   Tired, decreased energy 2 3 3 1 3   Change in appetite 2 3 1  0 1  Feeling bad or failure about yourself  3 0 0 0 0  Trouble concentrating 1 1 1  0 0  Moving slowly or fidgety/restless 1 0 0 0 0  Suicidal thoughts 2 0 0 0 0  PHQ-9 Score 18 11 8 2 7   Difficult doing work/chores Very difficult Somewhat difficult Somewhat difficult - -  Some recent data might be hidden   GAD 7 : Generalized Anxiety Score 11/02/2021 10/10/2021 07/13/2021 03/11/2019  Nervous, Anxious, on Edge 2 0 0 0  Control/stop worrying 2 0 0 0  Worry too much - different things 2 0 0 0  Trouble relaxing 3 0 0 0  Restless 2 0 0 0  Easily annoyed or irritable 1 0 0 0  Afraid - awful might happen 1 0 0 0  Total GAD 7 Score 13 0 0 0  Anxiety Difficulty Very difficult Not difficult at all Not difficult at all Not difficult at all       Assessment & Plan:   Problem List Items Addressed This Visit       Other   Major depressive disorder, recurrent episode, moderate ([facility]  Relevant Medications   DULoxetine (CYMBALTA) 20 MG capsule   PARoxetine (PAXIL) 10 MG tablet   hydrOXYzine (VISTARIL) 25 MG capsule   Other Relevant Orders   Ambulatory referral to Psychiatry   GAD (generalized anxiety disorder)   Relevant Medications   DULoxetine (CYMBALTA) 20 MG capsule   PARoxetine (PAXIL) 10 MG tablet   hydrOXYzine (VISTARIL) 25 MG capsule   Other Relevant Orders   Ambulatory referral to Psychiatry   Major depressive disorder, recurrent episode, moderate: -No current SI/HI, pt able to contract safety. Discussed with patient treatment adjustments. Wellbutrin XL recently  increased to 300 mg and recommend to continue as is. Discussed changing Duloxetine to SSRI and patient is agreeable. Discussed tapering instructions for Duloxetine and will start paroxetine 10 mg. Advised patient if when he starts taking paroxetine is unable to tolerate medication or mood worsens to stop medication and notify the office. Patient verbalized understanding. Will place external psychiatry referral to [facility] or [facility]. Encourage to resume therapy sessions with counselor/psychologist. Use good support system at home. -Will reassess mood and medication therapy in 6 weeks.  GAD: -Discussed with patient recommend to avoid benzodiazepine therapy and will change Duloxetine to paroxetine. Discussed with patient hydroxyzine to take as needed for severe anxiety and agreeable to trial medication. Discussed potential side effects. Will start hydroxyzine 25 mg and advised can take 50 mg if lower dose ineffective. Patient verbalized understanding.  -Recommend to resume [place] therapy and will place psychiatry referral. -Will reassess symptoms and medication therapy in 6 weeks.   Of note, patient forgot to mention during visit experiencing constant ear ringing. Will further discuss at follow up visit if symptoms still bothersome or have not improved.    Meds ordered this encounter  Medications   DULoxetine (CYMBALTA) 20 MG capsule    Sig: Take 3 capsules by mouth daily x 7 days. Take 2 capsules by mouth daily x 7 days. Take 1 capsule by mouth daily x 7 days and then discontinue.    Dispense:  42 capsule    Refill:  0    Order Specific Question:   Supervising Provider    Answer:   Beatrice Lecher D [[id number]]   PARoxetine (PAXIL) 10 MG tablet    Sig: Take 1 tablet (10 mg total) by mouth daily.    Dispense:  30 tablet    Refill:  1    Order Specific Question:   Supervising Provider    Answer:   Beatrice Lecher D [[id number]]   hydrOXYzine  (VISTARIL) 25 MG capsule    Sig: Take 1 capsule (25 mg total) by mouth every 6 (six) hours as needed for anxiety.    Dispense:  60 capsule    Refill:  0    Order Specific Question:   Supervising Provider    Answer:   Beatrice Lecher D [[id number]]    Follow-up: Return in about 6 weeks (around 12/14/2021) for MOOD.    Lorrene Reid, PA-C

## 2021-11-02 NOTE — Patient Instructions (Signed)
Managing Anxiety, Adult After being diagnosed with anxiety, you may be relieved to know why you have felt or behaved a certain way. You may also feel overwhelmed about the treatment ahead and what it will mean for your life. With care and support, you can manage this condition. How to manage lifestyle changes Managing stress and anxiety Stress is your body's reaction to life changes and events, both good and bad. Most stress will last just a few hours, but stress can be ongoing and can lead to more than just stress. Although stress can play a major role in anxiety, it is not the same as anxiety. Stress is usually caused by something external, such as a deadline, test, or competition. Stress normally passes after the triggering event has ended.  Anxiety is caused by something internal, such as imagining a terrible outcome or worrying that something will go wrong that will devastate you. Anxiety often does not go away even after the triggering event is over, and it can become long-term (chronic) worry. It is important to understand the differences between stress and anxiety and to manage your stress effectively so that it does not lead to an anxious response. Talk with your health care provider or a counselor to learn more about reducing anxiety and stress. He or she may suggest tension reduction techniques, such as: Music therapy. Spend time creating or listening to music that you enjoy and that inspires you. Mindfulness-based meditation. Practice being aware of your normal breaths while not trying to control your breathing. It can be done while sitting or walking. Centering prayer. This involves focusing on a word, phrase, or sacred image that means something to you and brings you peace. Deep breathing. To do this, expand your stomach and inhale slowly through your nose. Hold your breath for 3-5 seconds. Then exhale slowly, letting your stomach muscles relax. Self-talk. Learn to notice and identify  thought patterns that lead to anxiety reactions and change those patterns to thoughts that feel peaceful. Muscle relaxation. Taking time to tense muscles and then relax them. Choose a tension reduction technique that fits your lifestyle and personality. These techniques take time and practice. Set aside 5-15 minutes a day to do them. Therapists can offer counseling and training in these techniques. The training to help with anxiety may be covered by some insurance plans. Other things you can do to manage stress and anxiety include: Keeping a stress diary. This can help you learn what triggers your reaction and then learn ways to manage your response. Thinking about how you react to certain situations. You may not be able to control everything, but you can control your response. Making time for activities that help you relax and not feeling guilty about spending your time in this way. Doing visual imagery. This involves imagining or creating mental pictures to help you relax. Practicing yoga. Through yoga poses, you can lower tension and promote relaxation.  Medicines Medicines can help ease symptoms. Medicines for anxiety include: Antidepressant medicines. These are usually prescribed for long-term daily control. Anti-anxiety medicines. These may be added in severe cases, especially when panic attacks occur. Medicines will be prescribed by a health care provider. When used together, medicines, psychotherapy, and tension reduction techniques may be the most effective treatment. Relationships Relationships can play a big part in helping you recover. Try to spend more time connecting with trusted friends and family members. Consider going to couples counseling if you have a partner, taking family education classes, or going to family  therapy. Therapy can help you and others better understand your condition. How to recognize changes in your anxiety Everyone responds differently to treatment for  anxiety. Recovery from anxiety happens when symptoms decrease and stop interfering with your daily activities at home or work. This may mean that you will start to: Have better concentration and focus. Worry will interfere less in your daily thinking. Sleep better. Be less irritable. Have more energy. Have improved memory. It is also important to recognize when your condition is getting worse. Contact your health care provider if your symptoms interfere with home or work and you feel like your condition is not improving. Follow these instructions at home: Activity Exercise. Adults should do the following: Exercise for at least 150 minutes each week. The exercise should increase your heart rate and make you sweat (moderate-intensity exercise). Strengthening exercises at least twice a week. Get the right amount and quality of sleep. Most adults need 7-9 hours of sleep each night. Lifestyle  Eat a healthy diet that includes plenty of vegetables, fruits, whole grains, low-fat dairy products, and lean protein. Do not eat a lot of foods that are high in fats, added sugars, or salt (sodium). Make choices that simplify your life. Do not use any products that contain nicotine or tobacco. These products include cigarettes, chewing tobacco, and vaping devices, such as e-cigarettes. If you need help quitting, ask your health care provider. Avoid caffeine, alcohol, and certain over-the-counter cold medicines. These may make you feel worse. Ask your pharmacist which medicines to avoid. General instructions Take over-the-counter and prescription medicines only as told by your health care provider. Keep all follow-up visits. This is important. Where to find support You can get help and support from these sources: Self-help groups. Online and [employer]. A trusted spiritual leader. Couples counseling. Family education classes. Family therapy. Where to find more information You may find  that joining a support group helps you deal with your anxiety. The following sources can help you locate counselors or support groups near you: [place]: www.mentalhealthamerica.net Anxiety and Depression Association of [place] (ADAA): https://www.clark.net/ National Alliance on Mental Illness (NAMI): www.nami.org Contact a health care provider if: You have a hard time staying focused or finishing daily tasks. You spend many hours a day feeling worried about everyday life. You become exhausted by worry. You start to have headaches or frequently feel tense. You develop chronic nausea or diarrhea. Get help right away if: You have a racing heart and shortness of breath. You have thoughts of hurting yourself or others. If you ever feel like you may hurt yourself or others, or have thoughts about taking your own life, get help right away. Go to your nearest emergency department or: Call your local emergency services (911 in the [place] Call a suicide crisis helpline, such as the [place] at (567)351-2375 or 988 in the [place]. This is open 24 hours a day in the [place] Text the Crisis Text Line at 208-517-4183 (in the [place].). Summary Taking steps to learn and use tension reduction techniques can help calm you and help prevent triggering an anxiety reaction. When used together, medicines, psychotherapy, and tension reduction techniques may be the most effective treatment. Family, friends, and partners can play a big part in supporting you. This information is not intended to replace advice given to you by your health care provider. Make sure you discuss any questions you have with your health care provider. Document Revised: 04/26/2021 Document Reviewed: 01/22/2021 Elsevier Patient  Education  2022 [place].   Managing Depression, Adult Depression is a mental health condition that affects your thoughts, feelings, and actions. Being diagnosed with depression can bring you  relief if you did not know why you have felt or behaved a certain way. It could also leave you feeling overwhelmed with uncertainty about your future. Preparing yourself to manage your symptoms can help you feel more positive about your future. How to manage lifestyle changes Managing stress Stress is your body's reaction to life changes and events, both good and bad. Stress can add to your feelings of depression. Learning to manage your stress can help lessen your feelings of depression. Try some of the following approaches to reducing your stress (stress reduction techniques): Listen to music that you enjoy and that inspires you. Try using a meditation app or take a meditation class. Develop a practice that helps you connect with your spiritual self. Walk in nature, pray, or go to a place of worship. Do some deep breathing. To do this, inhale slowly through your nose. Pause at the top of your inhale for a few seconds and then exhale slowly, letting your muscles relax. Practice yoga to help relax and work your muscles. Choose a stress reduction technique that suits your lifestyle and personality. These techniques take time and practice to develop. Set aside 5-15 minutes a day to do them. Therapists can offer training in these techniques. Other things you can do to manage stress include: Keeping a stress diary. Knowing your limits and saying no when you think something is too much. Paying attention to how you react to certain situations. You may not be able to control everything, but you can change your reaction. Adding humor to your life by watching funny films or TV shows. Making time for activities that you enjoy and that relax you.  Medicines Medicines, such as antidepressants, are often a part of treatment for depression. Talk with your pharmacist or health care provider about all the medicines, supplements, and herbal products that you take, their possible side effects, and what medicines  and other products are safe to take together. Make sure to report any side effects you may have to your health care provider. Relationships Your health care provider may suggest family therapy, couples therapy, or individual therapy as part of your treatment. How to recognize changes Everyone responds differently to treatment for depression. As you recover from depression, you may start to: Have more interest in doing activities. Feel less hopeless. Have more energy. Overeat less often, or have a better appetite. Have better mental focus. It is important to recognize if your depression is not getting better or is getting worse. The symptoms you had in the beginning may return, such as: Tiredness (fatigue) or low energy. Eating too much or too little. Sleeping too much or too little. Feeling restless, agitated, or hopeless. Trouble focusing or making decisions. Unexplained physical complaints. Feeling irritable, angry, or aggressive. If you or your family members notice these symptoms coming back, let your health care provider know right away. Follow these instructions at home: Activity  Try to get some form of exercise each day, such as walking, biking, swimming, or lifting weights. Practice stress reduction techniques. Engage your mind by taking a class or doing some volunteer work. Lifestyle Get the right amount and quality of sleep. Cut down on using caffeine, tobacco, alcohol, and other potentially harmful substances. Eat a healthy diet that includes plenty of vegetables, fruits, whole grains, low-fat dairy products,  and lean protein. Do not eat a lot of foods that are high in solid fats, added sugars, or salt (sodium). General instructions Take over-the-counter and prescription medicines only as told by your health care provider. Keep all follow-up visits as told by your health care provider. This is important. Where to find support Talking to others Friends and family members  can be sources of support and guidance. Talk to trusted friends or family members about your condition. Explain your symptoms to them, and let them know that you are working with a health care provider to treat your depression. Tell friends and family members how they also can be helpful. Finances Find appropriate mental health providers that fit with your financial situation. Talk with your health care provider about options to get reduced prices on your medicines. Where to find more information You can find support in your area from: Anxiety and Depression Association of America (ADAA): www.adaa.org Mental Health America: www.mentalhealthamerica.net [employer] on Mental Illness: www.nami.org Contact a health care provider if: You stop taking your antidepressant medicines, and you have any of these symptoms: Nausea. Headache. Light-headedness. Chills and body aches. Not being able to sleep (insomnia). You or your friends and family think your depression is getting worse. Get help right away if: You have thoughts of hurting yourself or others. If you ever feel like you may hurt yourself or others, or have thoughts about taking your own life, get help right away. Go to your nearest emergency department or: Call your local emergency services (911 in the [place] Call a suicide crisis helpline, such as the [place] at 208-155-5449 or 988 in the [place]. This is open 24 hours a day in the [place] Text the Crisis Text Line at (773)194-9769 (in the [place].). Summary If you are diagnosed with depression, preparing yourself to manage your symptoms is a good way to feel positive about your future. Work with your health care provider on a management plan that includes stress reduction techniques, medicines (if applicable), therapy, and healthy lifestyle habits. Keep talking with your health care provider about how your treatment is working. If you have thoughts about taking your  own life, call a suicide crisis helpline or text a crisis text line. This information is not intended to replace advice given to you by your health care provider. Make sure you discuss any questions you have with your health care provider. Document Revised: 04/26/2021 Document Reviewed: 08/12/2019 Elsevier Patient Education  2022 [employer].

## 2021-11-06 ENCOUNTER — Other Ambulatory Visit: Payer: Self-pay | Admitting: Physician Assistant

## 2021-11-06 DIAGNOSIS — E291 Testicular hypofunction: Secondary | ICD-10-CM

## 2021-11-06 DIAGNOSIS — F988 Other specified behavioral and emotional disorders with onset usually occurring in childhood and adolescence: Secondary | ICD-10-CM

## 2021-11-15 ENCOUNTER — Encounter: Payer: Self-pay | Admitting: Physician Assistant

## 2021-11-24 ENCOUNTER — Encounter: Payer: Self-pay | Admitting: Physician Assistant

## 2021-11-24 ENCOUNTER — Other Ambulatory Visit: Payer: Self-pay

## 2021-11-24 DIAGNOSIS — E291 Testicular hypofunction: Secondary | ICD-10-CM

## 2021-11-28 DIAGNOSIS — F332 Major depressive disorder, recurrent severe without psychotic features: Secondary | ICD-10-CM | POA: Diagnosis not present

## 2021-12-04 ENCOUNTER — Other Ambulatory Visit: Payer: Self-pay | Admitting: Physician Assistant

## 2021-12-04 DIAGNOSIS — F988 Other specified behavioral and emotional disorders with onset usually occurring in childhood and adolescence: Secondary | ICD-10-CM

## 2021-12-14 ENCOUNTER — Other Ambulatory Visit: Payer: BC Managed Care – PPO

## 2021-12-14 ENCOUNTER — Ambulatory Visit (INDEPENDENT_AMBULATORY_CARE_PROVIDER_SITE_OTHER): Payer: BC Managed Care – PPO | Admitting: Physician Assistant

## 2021-12-14 ENCOUNTER — Other Ambulatory Visit: Payer: Self-pay

## 2021-12-14 ENCOUNTER — Encounter: Payer: Self-pay | Admitting: Physician Assistant

## 2021-12-14 VITALS — Ht 73.0 in | Wt 298.0 lb

## 2021-12-14 DIAGNOSIS — F411 Generalized anxiety disorder: Secondary | ICD-10-CM | POA: Diagnosis not present

## 2021-12-14 DIAGNOSIS — E291 Testicular hypofunction: Secondary | ICD-10-CM | POA: Diagnosis not present

## 2021-12-14 DIAGNOSIS — F988 Other specified behavioral and emotional disorders with onset usually occurring in childhood and adolescence: Secondary | ICD-10-CM

## 2021-12-14 DIAGNOSIS — F331 Major depressive disorder, recurrent, moderate: Secondary | ICD-10-CM | POA: Diagnosis not present

## 2021-12-14 MED ORDER — METHYLPHENIDATE HCL ER (CD) 10 MG PO CPCR
10.0000 mg | ORAL_CAPSULE | ORAL | 0 refills | Status: DC
Start: 2021-12-14 — End: 2021-12-14

## 2021-12-14 MED ORDER — LISDEXAMFETAMINE DIMESYLATE 10 MG PO CAPS: 10.0000 mg | ORAL_CAPSULE | Freq: Every day | ORAL | 0 refills | Status: DC

## 2021-12-14 MED ORDER — PAROXETINE HCL 20 MG PO TABS: 20.0000 mg | ORAL_TABLET | Freq: Every day | ORAL | 0 refills | Status: DC

## 2021-12-14 NOTE — Patient Instructions (Signed)

## 2021-12-14 NOTE — Progress Notes (Signed)
Telehealth office visit note for Philip Masker, PA-C- at Primary Care at [facility]   I connected with current patient today by telephone and verified that I am speaking with the correct person    Location of the patient: Home  Location of the provider: Office - This visit type was conducted due to national recommendations for restrictions regarding the COVID-19 Pandemic (e.g. social distancing) in an effort to limit this patient's exposure and mitigate transmission in our community.    - No physical exam could be performed with this format, beyond that communicated to [place] by the patient/ family members as noted.   - Additionally my office staff/ schedulers were to discuss with the patient that there may be a monetary charge related to this service, depending on their medical insurance.  My understanding is that patient understood and consented to proceed.     _________________________________________________________________________________   History of Present Illness: Patient calls in to follow up on mood. Patient reports had an anxiety episode last week which was not as severe as before. Reports tolerating Paxil 10 mg without issues. States hydroxyzine makes him sleepy. Re-established with previous therapist, has sessions once every 2-3 weeks. Sessions have been helpful. Reports has not re-contacted Psychiatry to get an appointment. Denies passive suicidal ideations.   ADHD: Requesting alternative for Adderall XR 30 mg which is currently on back-order. Previously has been on Vyvanse which was not as effective as Adderall XR. Has not tried methylphenidate. Continues with Adderall 10-20 mg in the afternoons. No chest pain, palpitations, or sleep disturbance.   GAD 7 : Generalized Anxiety Score 12/14/2021 11/02/2021 10/10/2021 07/13/2021  Nervous, Anxious, on Edge 0 2 0 0  Control/stop worrying 0 2 0 0  Worry too much - different things 1 2 0 0  Trouble relaxing 2 3 0 0  Restless 0 2 0  0  Easily annoyed or irritable 1 1 0 0  Afraid - awful might happen 0 1 0 0  Total GAD 7 Score 4 13 0 0  Anxiety Difficulty Somewhat difficult Very difficult Not difficult at all Not difficult at all    Depression screen [facility] 2/9 12/14/2021 11/02/2021 10/10/2021 07/13/2021 02/20/2021  Decreased Interest 1 2 1  0 0  Down, Depressed, Hopeless 1 2 0 0 0  PHQ - 2 Score 2 4 1  0 0  Altered sleeping 2 3 3 3 1   Tired, decreased energy 1 2 3 3 1   Change in appetite 0 2 3 1  0  Feeling bad or failure about yourself  1 3 0 0 0  Trouble concentrating 0 1 1 1  0  Moving slowly or fidgety/restless 0 1 0 0 0  Suicidal thoughts 0 2 0 0 0  PHQ-9 Score 6 18 11 8 2   Difficult doing work/chores Somewhat difficult Very difficult Somewhat difficult Somewhat difficult -  Some recent data might be hidden      Impression and Recommendations:     1. Major depressive disorder, recurrent episode, moderate (HCC)   2. GAD (generalized anxiety disorder)   3. Hypogonadism, male   4. Attention deficit disorder (ADD) without hyperactivity     Major depressive disorder, recurrent episode, moderate: -PHQ-9 has improved from 18 to 6, will increase Paroxetine to 20 mg to continue to help improve depression and anxiety. Recommend to continue with [facility] therapy and reach out to psychiatry. Advised to let us know if new referral needs to be placed in case current one was  closed. Will continue to monitor.  GAD: -GAD-7 score has improved from score of 13 to 4. Will increase Paroxetine to 20 mg to continue to improve depression and anxiety. Continue with [facility] therapy sessions. Discussed trial of Hydroxyzine 10 mg, patient prefers to continue with 25 mg as needed. Will continue to monitor.  ADD without hyperactivity: -Stable. -Will change Adderall XR 30 mg to Methylphenidate 10 mg CR, advised can increased to 20 mg if 10 mg ineffective and to let me know so a new rx is sent. Once Adderall XR 30 mg on stock recommend to resume.  Continue Adderall 10-20 mg for afternoon dose as needed. Follow-up in 3 months for medication management.  Hypogonadism, male: -Patient had repeat testosterone labs this morning. Pending results will make treatment adjustments if indicated. Currently on testosterone cypionate 200 mg once a week.   Addendum: See mychart message, changed methylphenidate to Vyvanse.  - As part of my medical decision making, I reviewed the following data within the electronic MEDICAL RECORD NUMBER History obtained from pt /family, CMA notes reviewed and incorporated if applicable, Labs reviewed, Radiograph/ tests reviewed if applicable and OV notes from prior OV's with me, as well as any other specialists she/he has seen since seeing me last, were all reviewed and used in my medical decision making process today.    - Additionally, when appropriate, discussion had with patient regarding our treatment plan, and their biases/concerns about that plan were used in my medical decision making today.    - The patient agreed with the plan and demonstrated an understanding of the instructions.   No barriers to understanding were identified.     - The patient was advised to call back or seek an in-person evaluation if the symptoms worsen or if the condition fails to improve as anticipated.   Return in about 3 months (around 03/16/2022) for Mood, ADD, TRT.    No orders of the defined types were placed in this encounter.   Meds ordered this encounter  Medications   DISCONTD: methylphenidate (METADATE CD) 10 MG CR capsule    Sig: Take 1 capsule (10 mg total) by mouth every morning.    Dispense:  30 capsule    Refill:  0    Order Specific Question:   Supervising Provider    Answer:   Nani Gasser D [[id number]]   PARoxetine (PAXIL) 20 MG tablet    Sig: Take 1 tablet (20 mg total) by mouth daily.    Dispense:  90 tablet    Refill:  0    Order Specific Question:   Supervising Provider    Answer:   Nani Gasser D  [[id number]]    Medications Discontinued During This Encounter  Medication Reason   DULoxetine (CYMBALTA) 20 MG capsule    DULoxetine (CYMBALTA) 60 MG capsule    PARoxetine (PAXIL) 10 MG tablet Change in therapy   amphetamine-dextroamphetamine (ADDERALL XR) 30 MG 24 hr capsule Change in therapy       Time spent on telephone encounter was 11 minutes.      The 21st Century Cures Act was signed into law in 2016 which includes the topic of electronic health records.  This provides immediate access to information in MyChart.  This includes consultation notes, operative notes, office notes, lab results and pathology reports.  If you have any questions about what you read please let us know at your next visit or call us at the office.  We are right here with you.  __________________________________________________________________________________     Patient Care Team    Relationship Specialty Notifications Start End  Philip Daugherty, [place] PCP - General   02/14/20   Jethro Bolus, MD (Inactive) Consulting Physician Urology  02/19/12    Comment: for male infertility  Delton See, MD Consulting Physician Sports Medicine  11/11/13   Romero Belling, MD Consulting Physician Endocrinology  05/08/17      -Vitals obtained; medications/ allergies reconciled;  personal medical, social, Sx etc.histories were updated by CMA, reviewed by me and are reflected in chart   Patient Active Problem List   Diagnosis Date Noted   Dermatitis of external ear 08/01/2021   Itching of ear 08/01/2021   Myopia of both eyes 08/17/2019   Regular astigmatism of left eye 08/17/2019   Morbid obesity (HCC) 07/25/2019   Alcohol dependence with other alcohol-induced disorder (HCC) 07/25/2019   Psychophysiological insomnia 07/25/2019   Depression, major, single episode, severe (HCC) 07/25/2019   Suicidal ideations 07/25/2019   Attention deficit disorder (ADD) without hyperactivity 07/25/2019   GAD (generalized  anxiety disorder) 07/25/2019   Major depressive disorder, recurrent episode, moderate (HCC) 05/28/2014   Situational anxiety 03/19/2014   Asthmatic bronchitis 08/06/2012   Hypogonadism, male 01/28/2012   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   amphetamine-dextroamphetamine (ADDERALL) 10 MG tablet TAKE 1 TO 2 TABLETS BY MOUTH IN THE AFTERNOON.   buPROPion (WELLBUTRIN XL) 300 MG 24 hr tablet Take 1 tablet (300 mg total) by mouth daily.   hydrOXYzine (VISTARIL) 25 MG capsule Take 1 capsule (25 mg total) by mouth every 6 (six) hours as needed for anxiety.   PARoxetine (PAXIL) 20 MG tablet Take 1 tablet (20 mg total) by mouth daily.   sildenafil (VIAGRA) 25 MG tablet Take 1 tablet (25 mg total) by mouth daily as needed for erectile dysfunction.   testosterone cypionate (DEPOTESTOSTERONE CYPIONATE) 200 MG/ML injection INJECT 1 ML (200 MG TOTAL) INTO THE MUSCLE ONCE A WEEK.   [DISCONTINUED] methylphenidate (METADATE CD) 10 MG CR capsule Take 1 capsule (10 mg total) by mouth every morning.   [DISCONTINUED] PARoxetine (PAXIL) 10 MG tablet Take 1 tablet (10 mg total) by mouth daily.     Allergies:  No Known Allergies   ROS:  See above HPI for pertinent positives and negatives   Objective:   Height 6\' 1"  (1.854 m), weight 298 lb (135.2 kg).  (if some vitals are omitted, this means that patient was UNABLE to obtain them. ) General: A & O * 3; sounds in no acute distress Respiratory: speaking in full sentences, no conversational dyspnea Psych: insight appears good, mood- appears appropriate

## 2021-12-19 ENCOUNTER — Encounter: Payer: Self-pay | Admitting: Physician Assistant

## 2021-12-20 ENCOUNTER — Other Ambulatory Visit: Payer: Self-pay | Admitting: Nurse Practitioner

## 2021-12-20 DIAGNOSIS — E291 Testicular hypofunction: Secondary | ICD-10-CM

## 2021-12-20 LAB — TESTOSTERONE,FREE AND TOTAL
Testosterone, Free: 4.2 pg/mL — ABNORMAL LOW (ref 6.8–21.5)
Testosterone: 98 ng/dL — ABNORMAL LOW (ref 264–916)

## 2021-12-20 MED ORDER — TESTOSTERONE CYPIONATE 200 MG/ML IM SOLN: 300.0000 mg | INTRAMUSCULAR | 0 refills | Status: DC

## 2021-12-20 NOTE — Progress Notes (Signed)
Testosterone level drawn 12/14/2021 with result of 98. Currently on '200mg'$  weekly. Increase to '300mg'$  weekly. Needs to check testosterone in 4 weeks to reassess.  ?

## 2021-12-29 ENCOUNTER — Encounter: Payer: Self-pay | Admitting: Physician Assistant

## 2021-12-29 DIAGNOSIS — F988 Other specified behavioral and emotional disorders with onset usually occurring in childhood and adolescence: Secondary | ICD-10-CM

## 2022-01-01 MED ORDER — LISDEXAMFETAMINE DIMESYLATE 20 MG PO CAPS: 20.0000 mg | ORAL_CAPSULE | Freq: Every day | ORAL | 0 refills | Status: DC

## 2022-01-02 ENCOUNTER — Encounter: Payer: Self-pay | Admitting: Nurse Practitioner

## 2022-01-02 ENCOUNTER — Ambulatory Visit: Payer: BC Managed Care – PPO | Admitting: Nurse Practitioner

## 2022-01-02 ENCOUNTER — Other Ambulatory Visit: Payer: Self-pay

## 2022-01-02 VITALS — BP 117/73 | HR 82 | Temp 98.0°F | Ht 73.0 in | Wt 299.1 lb

## 2022-01-02 DIAGNOSIS — M25511 Pain in right shoulder: Secondary | ICD-10-CM | POA: Insufficient documentation

## 2022-01-02 MED ORDER — PREDNISONE 10 MG (21) PO TBPK: ORAL_TABLET | ORAL | 0 refills | Status: DC

## 2022-01-02 NOTE — Progress Notes (Signed)
Established patient visit ? ? ?Patient: Philip Daugherty   DOB: Sep 03, 1976   46 y.o. Male  MRN: [id number] ?Visit Date: 01/02/2022 ? ?Chief Complaint  ?Patient presents with  ? Shoulder Pain  ? ?Subjective  ?  ?Shoulder Pain  ?The pain is present in the right shoulder. This is a new problem. The current episode started 1 to 4 weeks ago. There has been no history of extremity trauma. The problem occurs intermittently. The problem has been unchanged. The quality of the pain is described as aching (can become sharp if adding weight to the arm). Associated symptoms include an inability to bear weight, a limited range of motion and stiffness. Pertinent negatives include no fever. The symptoms are aggravated by activity. He has tried heat for the symptoms. The treatment provided no relief.   ? ? ?Medications: ?Outpatient Medications Prior to Visit  ?Medication Sig  ? albuterol (VENTOLIN HFA) 108 (90 Base) MCG/ACT inhaler Inhale 2 puffs into the lungs every 6 (six) hours as needed for wheezing or shortness of breath.  ? amphetamine-dextroamphetamine (ADDERALL) 10 MG tablet TAKE 1 TO 2 TABLETS BY MOUTH IN THE AFTERNOON.  ? buPROPion (WELLBUTRIN XL) 300 MG 24 hr tablet Take 1 tablet (300 mg total) by mouth daily.  ? hydrOXYzine (VISTARIL) 25 MG capsule Take 1 capsule (25 mg total) by mouth every 6 (six) hours as needed for anxiety.  ? [START ON 01/11/2022] lisdexamfetamine (VYVANSE) 20 MG capsule Take 1 capsule (20 mg total) by mouth daily.  ? PARoxetine (PAXIL) 20 MG tablet Take 1 tablet (20 mg total) by mouth daily.  ? sildenafil (VIAGRA) 25 MG tablet Take 1 tablet (25 mg total) by mouth daily as needed for erectile dysfunction.  ? testosterone cypionate (DEPOTESTOSTERONE CYPIONATE) 200 MG/ML injection Inject 1.5 mLs (300 mg total) into the muscle once a week.  ? ?No facility-administered medications prior to visit.  ? ? ?Review of Systems  ?Constitutional:  Negative for activity change, chills, fatigue and fever.  ?HENT:   Negative for congestion, postnasal drip, rhinorrhea, sinus pressure, sinus pain, sneezing and sore throat.   ?Eyes: Negative.   ?Respiratory:  Negative for cough, shortness of breath and wheezing.   ?Cardiovascular:  Negative for chest pain and palpitations.  ?Gastrointestinal:  Negative for constipation, diarrhea, nausea and vomiting.  ?Endocrine: Negative for cold intolerance, heat intolerance, polydipsia and polyuria.  ?Genitourinary:  Negative for dysuria, frequency and urgency.  ?Musculoskeletal:  Positive for arthralgias, myalgias and stiffness. Negative for back pain.  ?     Right shoulder pain. no recent trauma. Feels weak and starts to hurt if lifting arm past horizontal. Worse if there is weight added. Able to move arm In all directions, but not without the weakness and pain.   ?Skin:  Negative for rash.  ?Allergic/Immunologic: Negative for environmental allergies.  ?Neurological:  Negative for dizziness, weakness and headaches.  ?Psychiatric/Behavioral:  The patient is not nervous/anxious.   ? ? Objective  ?  ? ?Today's Vitals  ? 01/02/22 1103  ?BP: 117/73  ?Pulse: 82  ?Temp: 98 ?F (36.7 ?C)  ?SpO2: 97%  ?Weight: 299 lb 1.6 oz (135.7 kg)  ?Height: '6\' 1"'$  (1.854 m)  ? ?Body mass index is 39.46 kg/m?.  ? ?Physical Exam ?Vitals and nursing note reviewed.  ?Constitutional:   ?   Appearance: Normal appearance. He is well-developed.  ?HENT:  ?   Head: Normocephalic.  ?Eyes:  ?   Pupils: Pupils are equal, round, and reactive to light.  ?Cardiovascular:  ?  Rate and Rhythm: Normal rate and regular rhythm.  ?   Pulses: Normal pulses.  ?   Heart sounds: Normal heart sounds.  ?Pulmonary:  ?   Effort: Pulmonary effort is normal.  ?   Breath sounds: Normal breath sounds.  ?Abdominal:  ?   Palpations: Abdomen is soft.  ?Musculoskeletal:  ?   Right shoulder: Tenderness and crepitus present. Decreased range of motion. Decreased strength.  ?     Arms: ? ?   Cervical back: Normal range of motion and neck supple.  ?    Comments: No bony deformities or abnormalities are noted at this time.   ?Lymphadenopathy:  ?   Cervical: No cervical adenopathy.  ?Skin: ?   General: Skin is warm and dry.  ?   Capillary Refill: Capillary refill takes less than 2 seconds.  ?Neurological:  ?   General: No focal deficit present.  ?   Mental Status: He is alert and oriented to person, place, and time.  ?Psychiatric:     ?   Mood and Affect: Mood normal.     ?   Behavior: Behavior normal.     ?   Thought Content: Thought content normal.     ?   Judgment: Judgment normal.  ?  ? ? Assessment & Plan  ?  ? ?1. Acute pain of right shoulder ?Active ROM slightly decreased. Will start prednisone taper. Take a directed for 6 days. Recommended use of OTC naproxen twice daiy as needed for pain. Consider trial of biofreeze as needed. Will get x-ray of right shoulder for further evaluation. Refer to orhtopedics as indicated  ?- DG Shoulder Right; Future ?- predniSONE (STERAPRED UNI-PAK 21 TAB) 10 MG (21) TBPK tablet; 6 day taper - take by mouth as directed for 6 days  Dispense: 21 tablet; Refill: 0  ? ?Return for prn worsening or persistent symptoms.  ?   ? ? ? ?Ronnell Freshwater, NP  ?[facility] ?[phone] (phone) ?[phone] (fax) ? ?[facility] ?

## 2022-01-03 ENCOUNTER — Ambulatory Visit
Admission: RE | Admit: 2022-01-03 | Discharge: 2022-01-03 | Disposition: A | Discharge status: Home / Self Care | Payer: BC Managed Care – PPO | Source: Ambulatory Visit | Attending: Nurse Practitioner | Admitting: Nurse Practitioner

## 2022-01-03 DIAGNOSIS — M25511 Pain in right shoulder: Secondary | ICD-10-CM

## 2022-01-04 ENCOUNTER — Encounter: Payer: Self-pay | Admitting: Nurse Practitioner

## 2022-01-08 NOTE — Telephone Encounter (Signed)
Can you let him know that his shoulder x-ray is negative for any acote abnormalities or deformities. I ca ntry ordering MRI or I can refer him to orthopedics for further evaluation. Is there someone in particular he would liek to see?

## 2022-01-08 NOTE — Progress Notes (Signed)
Negative x-ray. Patient to be notified.. will consider MRI or referral to orthopedics for further evaluation.

## 2022-01-12 ENCOUNTER — Other Ambulatory Visit: Payer: Self-pay | Admitting: Physician Assistant

## 2022-01-12 ENCOUNTER — Encounter: Payer: Self-pay | Admitting: Physician Assistant

## 2022-01-12 DIAGNOSIS — F988 Other specified behavioral and emotional disorders with onset usually occurring in childhood and adolescence: Secondary | ICD-10-CM

## 2022-01-12 DIAGNOSIS — N529 Male erectile dysfunction, unspecified: Secondary | ICD-10-CM

## 2022-01-12 DIAGNOSIS — F331 Major depressive disorder, recurrent, moderate: Secondary | ICD-10-CM

## 2022-01-12 MED ORDER — TADALAFIL 10 MG PO TABS: 10.0000 mg | ORAL_TABLET | Freq: Every day | ORAL | 0 refills | Status: DC | PRN

## 2022-01-15 ENCOUNTER — Telehealth: Payer: BC Managed Care – PPO | Admitting: Physician Assistant

## 2022-01-15 DIAGNOSIS — J208 Acute bronchitis due to other specified organisms: Secondary | ICD-10-CM | POA: Diagnosis not present

## 2022-01-15 DIAGNOSIS — B9689 Other specified bacterial agents as the cause of diseases classified elsewhere: Secondary | ICD-10-CM | POA: Diagnosis not present

## 2022-01-15 MED ORDER — AZITHROMYCIN 250 MG PO TABS: ORAL_TABLET | ORAL | 0 refills | Status: AC

## 2022-01-15 NOTE — Progress Notes (Signed)
?Virtual Visit Consent  ? ?Philip Daugherty, you are scheduled for a virtual visit with a [facility] provider today.   ?  ?Just as with appointments in the office, your consent must be obtained to participate.  Your consent will be active for this visit and any virtual visit you may have with one of our providers in the next 365 days.   ?  ?If you have a MyChart account, a copy of this consent can be sent to you electronically.  All virtual visits are billed to your insurance company just like a traditional visit in the office.   ? ?As this is a virtual visit, video technology does not allow for your provider to perform a traditional examination.  This may limit your provider's ability to fully assess your condition.  If your provider identifies any concerns that need to be evaluated in person or the need to arrange testing (such as labs, EKG, etc.), we will make arrangements to do so.   ?  ?Although advances in technology are sophisticated, we cannot ensure that it will always work on either your end or our end.  If the connection with a video visit is poor, the visit may have to be switched to a telephone visit.  With either a video or telephone visit, we are not always able to ensure that we have a secure connection.    ? ?I need to obtain your verbal consent now.   Are you willing to proceed with your visit today?  ?  ?Philip Daugherty has provided verbal consent on 01/15/2022 for a virtual visit (video or telephone). ?  ?Philip Daring, PA-C  ? ?Date: 01/15/2022 9:21 AM ? ? ?Virtual Visit via Video Note  ? ?IMar Daugherty, connected with  Philip Daugherty  ([id number], 1976/07/02) on 01/15/22 at  9:15 AM EDT by a video-enabled telemedicine application and verified that I am speaking with the correct person using two identifiers. ? ?Location: ?Patient: Virtual Visit Location Patient: Home ?Provider: Virtual Visit Location Provider: Home Office ?  ?I discussed the limitations of evaluation and management  by telemedicine and the availability of in person appointments. The patient expressed understanding and agreed to proceed.   ? ?History of Present Illness: ?Philip Daugherty is a 46 y.o. who identifies as a male who was assigned male at birth, and is being seen today for cough and sore throat. ? ?HPI: Sore Throat  ?This is a new problem. The current episode started in the past 7 days. The problem has been gradually worsening. There has been no fever. Associated symptoms include congestion, coughing ("rough cough with occasional phlegm"), headaches ("a little bit") and trouble swallowing. Pertinent negatives include no diarrhea, ear discharge, ear pain, hoarse voice, plugged ear sensation or swollen glands. Associated symptoms comments: Body aches, fatigue, feels chest rattle. Exposure to: kids have been sick. He has tried NSAIDs (zinc, vit c , elderberry, robitussin) for the symptoms. The treatment provided no relief.   ? ? ?Problems:  ?Patient Active Problem List  ? Diagnosis Date Noted  ? Acute pain of right shoulder 01/02/2022  ? Dermatitis of external ear 08/01/2021  ? Itching of ear 08/01/2021  ? Myopia of both eyes 08/17/2019  ? Regular astigmatism of left eye 08/17/2019  ? Morbid obesity ([place]) 07/25/2019  ? Alcohol dependence with other alcohol-induced disorder ([place]) 07/25/2019  ? Psychophysiological insomnia 07/25/2019  ? Depression, major, single episode, severe ([place]) 07/25/2019  ? Suicidal ideations 07/25/2019  ?  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 GAD (generalized anxiety disorder) 07/25/2019  ? Major depressive disorder, recurrent episode, moderate ([place]) 05/28/2014  ? Situational anxiety 03/19/2014  ? Asthmatic bronchitis 08/06/2012  ? Hypogonadism, male 01/28/2012  ? 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 Preventative health care 01/28/2012  ?  ?Allergies: No Known Allergies ?Medications:  ?Current Outpatient Medications:  ?  azithromycin (ZITHROMAX) 250 MG tablet, Take 2  tablets on day 1, then 1 tablet daily on days 2 through 5, Disp: 6 tablet, Rfl: 0 ?  albuterol (VENTOLIN HFA) 108 (90 Base) MCG/ACT inhaler, Inhale 2 puffs into the lungs every 6 (six) hours as needed for wheezing or shortness of breath., Disp: 8 g, Rfl: 0 ?  amphetamine-dextroamphetamine (ADDERALL) 10 MG tablet, TAKE 1 TO 2 TABLETS BY MOUTH IN THE AFTERNOON., Disp: 60 tablet, Rfl: 0 ?  buPROPion (WELLBUTRIN XL) 300 MG 24 hr tablet, TAKE 1 TABLET (300 MG TOTAL) BY MOUTH DAILY., Disp: 90 tablet, Rfl: 0 ?  hydrOXYzine (VISTARIL) 25 MG capsule, Take 1 capsule (25 mg total) by mouth every 6 (six) hours as needed for anxiety., Disp: 60 capsule, Rfl: 0 ?  lisdexamfetamine (VYVANSE) 20 MG capsule, Take 1 capsule (20 mg total) by mouth daily., Disp: 30 capsule, Rfl: 0 ?  PARoxetine (PAXIL) 20 MG tablet, Take 1 tablet (20 mg total) by mouth daily., Disp: 90 tablet, Rfl: 0 ?  predniSONE (STERAPRED UNI-PAK 21 TAB) 10 MG (21) TBPK tablet, 6 day taper - take by mouth as directed for 6 days, Disp: 21 tablet, Rfl: 0 ?  tadalafil (CIALIS) 10 MG tablet, Take 1 tablet (10 mg total) by mouth daily as needed for erectile dysfunction., Disp: 10 tablet, Rfl: 0 ?  testosterone cypionate (DEPOTESTOSTERONE CYPIONATE) 200 MG/ML injection, Inject 1.5 mLs (300 mg total) into the muscle once a week., Disp: 10 mL, Rfl: 0 ? ?Observations/Objective: ?Patient is well-developed, well-nourished in no acute distress.  ?Resting comfortably at home.  ?Head is normocephalic, atraumatic.  ?No labored breathing.  ?Speech is clear and coherent with logical content.  ?Patient is alert and oriented at baseline.  ? ? ?Assessment and Plan: ?1. Acute bacterial bronchitis ?- azithromycin (ZITHROMAX) 250 MG tablet; Take 2 tablets on day 1, then 1 tablet daily on days 2 through 5  Dispense: 6 tablet; Refill: 0 ? ?- Suspect bacterial bronchitis ?- Zpack prescribed ?- Mucinex ?- Tylenol and Ibuprofen as needed ?- Steam and humidifier can help ?- Push fluids ?- Seek  in person evaluation if not improving or if symptoms worsen ? ?Follow Up Instructions: ?I discussed the assessment and treatment plan with the patient. The patient was provided an opportunity to ask questions and all were answered. The patient agreed with the plan and demonstrated an understanding of the instructions.  A copy of instructions were sent to the patient via MyChart unless otherwise noted below.  ? ? ?The patient was advised to call back or seek an in-person evaluation if the symptoms worsen or if the condition fails to improve as anticipated. ? ?Time:  ?I spent 10 minutes with the patient via telehealth technology discussing the above problems/concerns.   ? ?Philip Daring, PA-C ?

## 2022-01-15 NOTE — Patient Instructions (Signed)
?Philip Daugherty, thank you for joining Philip Daring, PA-C for today's virtual visit.  While this provider is not your primary care provider (PCP), if your PCP is located in our provider database this encounter information will be shared with them immediately following your visit. ? ?Consent: ?(Patient) Philip Daugherty provided verbal consent for this virtual visit at the beginning of the encounter. ? ?Current Medications: ? ?Current Outpatient Medications:  ?  azithromycin (ZITHROMAX) 250 MG tablet, Take 2 tablets on day 1, then 1 tablet daily on days 2 through 5, Disp: 6 tablet, Rfl: 0 ?  albuterol (VENTOLIN HFA) 108 (90 Base) MCG/ACT inhaler, Inhale 2 puffs into the lungs every 6 (six) hours as needed for wheezing or shortness of breath., Disp: 8 g, Rfl: 0 ?  amphetamine-dextroamphetamine (ADDERALL) 10 MG tablet, TAKE 1 TO 2 TABLETS BY MOUTH IN THE AFTERNOON., Disp: 60 tablet, Rfl: 0 ?  buPROPion (WELLBUTRIN XL) 300 MG 24 hr tablet, TAKE 1 TABLET (300 MG TOTAL) BY MOUTH DAILY., Disp: 90 tablet, Rfl: 0 ?  hydrOXYzine (VISTARIL) 25 MG capsule, Take 1 capsule (25 mg total) by mouth every 6 (six) hours as needed for anxiety., Disp: 60 capsule, Rfl: 0 ?  lisdexamfetamine (VYVANSE) 20 MG capsule, Take 1 capsule (20 mg total) by mouth daily., Disp: 30 capsule, Rfl: 0 ?  PARoxetine (PAXIL) 20 MG tablet, Take 1 tablet (20 mg total) by mouth daily., Disp: 90 tablet, Rfl: 0 ?  predniSONE (STERAPRED UNI-PAK 21 TAB) 10 MG (21) TBPK tablet, 6 day taper - take by mouth as directed for 6 days, Disp: 21 tablet, Rfl: 0 ?  tadalafil (CIALIS) 10 MG tablet, Take 1 tablet (10 mg total) by mouth daily as needed for erectile dysfunction., Disp: 10 tablet, Rfl: 0 ?  testosterone cypionate (DEPOTESTOSTERONE CYPIONATE) 200 MG/ML injection, Inject 1.5 mLs (300 mg total) into the muscle once a week., Disp: 10 mL, Rfl: 0  ? ?Medications ordered in this encounter:  ?Meds ordered this encounter  ?Medications  ? azithromycin  (ZITHROMAX) 250 MG tablet  ?  Sig: Take 2 tablets on day 1, then 1 tablet daily on days 2 through 5  ?  Dispense:  6 tablet  ?  Refill:  0  ?  Order Specific Question:   Supervising Provider  ?  Answer:   Noemi Chapel [[id number]]  ?  ? ?*If you need refills on other medications prior to your next appointment, please contact your pharmacy* ? ?Follow-Up: ?Call back or seek an in-person evaluation if the symptoms worsen or if the condition fails to improve as anticipated. ? ?Other Instructions ?Acute Bronchitis, Adult ?Acute bronchitis is sudden inflammation of the main airways (bronchi) that come off the windpipe (trachea) in the lungs. The swelling causes the airways to get smaller and make more mucus than normal. This can make it hard to breathe and can cause coughing or noisy breathing (wheezing). ?Acute bronchitis may last several weeks. The cough may last longer. Allergies, asthma, and exposure to smoke may make the condition worse. ?What are the causes? ?This condition can be caused by germs and by substances that irritate the lungs, including: ?Cold and flu viruses. The most common cause of this condition is the virus that causes the common cold. ?Bacteria. This is less common. ?Breathing in substances that irritate the lungs, including: ?Smoke from cigarettes and other forms of tobacco. ?Dust and pollen. ?Fumes from household cleaning products, gases, or burned fuel. ?Indoor or outdoor air pollution. ?What increases  the risk? ?The following factors may make you more likely to develop this condition: ?A weak body's defense system, also called the immune system. ?A condition that affects your lungs and breathing, such as asthma. ?What are the signs or symptoms? ?Common symptoms of this condition include: ?Coughing. This may bring up clear, yellow, or green mucus from your lungs (sputum). ?Wheezing. ?Runny or stuffy nose. ?Having too much mucus in your lungs (chest congestion). ?Shortness of breath. ?Aches and pains,  including sore throat or chest. ?How is this diagnosed? ?This condition is usually diagnosed based on: ?Your symptoms and medical history. ?A physical exam. ?You may also have other tests, including tests to rule out other conditions, such as pneumonia. These tests include: ?A test of lung function. ?Test of a mucus sample to look for the presence of bacteria. ?Tests to check the oxygen level in your blood. ?Blood tests. ?Chest X-ray. ?How is this treated? ?Most cases of acute bronchitis clear up over time without treatment. Your health care provider may recommend: ?Drinking more fluids to help thin your mucus so it is easier to cough up. ?Taking inhaled medicine (inhaler) to improve air flow in and out of your lungs. ?Using a vaporizer or a humidifier. These are machines that add water to the air to help you breathe better. ?Taking a medicine that thins mucus and clears congestion (expectorant). ?Taking a medicine that prevents or stops coughing (cough suppressant). ?It is notcommon to take an antibiotic medicine for this condition. ?Follow these instructions at home: ? ?Take over-the-counter and prescription medicines only as told by your health care provider. ?Use an inhaler, vaporizer, or humidifier as told by your health care provider. ?Take two teaspoons (10 mL) of honey at bedtime to lessen coughing at night. ?Drink enough fluid to keep your urine pale yellow. ?Do not use any products that contain nicotine or tobacco. These products include cigarettes, chewing tobacco, and vaping devices, such as e-cigarettes. If you need help quitting, ask your health care provider. ?Get plenty of rest. ?Return to your normal activities as told by your health care provider. Ask your health care provider what activities are safe for you. ?Keep all follow-up visits. This is important. ?How is this prevented? ?To lower your risk of getting this condition again: ?Wash your hands often with soap and water for at least 20 seconds.  If soap and water are not available, use hand sanitizer. ?Avoid contact with people who have cold symptoms. ?Try not to touch your mouth, nose, or eyes with your hands. ?Avoid breathing in smoke or chemical fumes. Breathing smoke or chemical fumes will make your condition worse. ?Get the flu shot every year. ?Contact a health care provider if: ?Your symptoms do not improve after 2 weeks. ?You have trouble coughing up the mucus. ?Your cough keeps you awake at night. ?You have a fever. ?Get help right away if you: ?Cough up blood. ?Feel pain in your chest. ?Have severe shortness of breath. ?Faint or keep feeling like you are going to faint. ?Have a severe headache. ?Have a fever or chills that get worse. ?These symptoms may represent a serious problem that is an emergency. Do not wait to see if the symptoms will go away. Get medical help right away. Call your local emergency services (911 in the [place] Do not drive yourself to the hospital. ?Summary ?Acute bronchitis is inflammation of the main airways (bronchi) that come off the windpipe (trachea) in the lungs. The swelling causes the airways to get smaller  and make more mucus than normal. ?Drinking more fluids can help thin your mucus so it is easier to cough up. ?Take over-the-counter and prescription medicines only as told by your health care provider. ?Do not use any products that contain nicotine or tobacco. These products include cigarettes, chewing tobacco, and vaping devices, such as e-cigarettes. If you need help quitting, ask your health care provider. ?Contact a health care provider if your symptoms do not improve after 2 weeks. ?This information is not intended to replace advice given to you by your health care provider. Make sure you discuss any questions you have with your health care provider. ?Document Revised: 02/01/2021 Document Reviewed: 02/01/2021 ?Elsevier Patient Education ? 2022 [place]. ? ? ? ?If you have been instructed to have an  in-person evaluation today at a local Urgent Care facility, please use the link below. It will take you to a list of all of our available [facility], including address, phone number and hours of opera

## 2022-01-17 ENCOUNTER — Telehealth: Payer: BC Managed Care – PPO | Admitting: Physician Assistant

## 2022-01-17 ENCOUNTER — Other Ambulatory Visit: Payer: Self-pay | Admitting: Physician Assistant

## 2022-01-17 ENCOUNTER — Encounter: Payer: Self-pay | Admitting: Physician Assistant

## 2022-01-17 DIAGNOSIS — J069 Acute upper respiratory infection, unspecified: Secondary | ICD-10-CM

## 2022-01-17 MED ORDER — AMOXICILLIN-POT CLAVULANATE 875-125 MG PO TABS: 1.0000 | ORAL_TABLET | Freq: Two times a day (BID) | ORAL | 0 refills | Status: DC

## 2022-01-17 MED ORDER — BENZONATATE 100 MG PO CAPS: 100.0000 mg | ORAL_CAPSULE | Freq: Three times a day (TID) | ORAL | 0 refills | Status: DC | PRN

## 2022-01-17 NOTE — Progress Notes (Addendum)
?Virtual Visit Consent  ? ?[name], you are scheduled for a virtual visit with a [facility] provider today.   ?  ?Just as with appointments in the office, your consent must be obtained to participate.  Your consent will be active for this visit and any virtual visit you may have with one of our providers in the next 365 days.   ?  ?If you have a MyChart account, a copy of this consent can be sent to you electronically.  All virtual visits are billed to your insurance company just like a traditional visit in the office.   ? ?As this is a virtual visit, video technology does not allow for your provider to perform a traditional examination.  This may limit your provider's ability to fully assess your condition.  If your provider identifies any concerns that need to be evaluated in person or the need to arrange testing (such as labs, EKG, etc.), we will make arrangements to do so.   ?  ?Although advances in technology are sophisticated, we cannot ensure that it will always work on either your end or our end.  If the connection with a video visit is poor, the visit may have to be switched to a telephone visit.  With either a video or telephone visit, we are not always able to ensure that we have a secure connection.    ? ?I need to obtain your verbal consent now.   Are you willing to proceed with your visit today?  ?  ?Philip Daugherty has provided verbal consent on 01/17/2022 for a virtual visit (video or telephone). ?  ?Leeanne Rio, PA-C  ? ?Date: 01/17/2022 1:09 PM ? ? ?Virtual Visit via Video Note  ? ?I, Leeanne Rio, connected with  Philip Daugherty  ([id number], 1976-01-16) on 01/17/22 at  1:00 PM EDT by a video-enabled telemedicine application and verified that I am speaking with the correct person using two identifiers. ? ?Location: ?Patient: Virtual Visit Location Patient: Home ?Provider: Virtual Visit Location Provider: Home Office ?  ?I discussed the limitations of evaluation and management  by telemedicine and the availability of in person appointments. The patient expressed understanding and agreed to proceed.   ? ?History of Present Illness: ?Philip Daugherty is a 46 y.o. who identifies as a male who was assigned male at birth, and is being seen today for worsening of his URI symptoms. Patient was seen via video visit two days ago (4/3) and diagnosed with suspected bacterial bronchitis. Supportive measures and OTC medications were reviewed with patient at that time. Was started on a z-pack which he endorses taking as directed. Notes despite this the cough is getting worse, especially at nighttime and affecting sleep. Now with episodes of feeling hot/flushed but temperature remains normal. Some body aches and now noting some mental fogginess at time without any true AMS. Denies vision change, LH/dizziness. Notes symptoms initially started over the weekend when asked during this interview. Has not taken a home COVID test but has them at home.  ? ? ? ?HPI: HPI  ?Problems:  ?Patient Active Problem List  ? Diagnosis Date Noted  ? Acute pain of right shoulder 01/02/2022  ? Dermatitis of external ear 08/01/2021  ? Itching of ear 08/01/2021  ? Myopia of both eyes 08/17/2019  ? Regular astigmatism of left eye 08/17/2019  ? Morbid obesity ([place]) 07/25/2019  ? Alcohol dependence with other alcohol-induced disorder ([place]) 07/25/2019  ? Psychophysiological insomnia 07/25/2019  ?  Depression, major, single episode, severe ([place]) 07/25/2019  ? Suicidal ideations 07/25/2019  ? 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 GAD (generalized anxiety disorder) 07/25/2019  ? Major depressive disorder, recurrent episode, moderate ([place]) 05/28/2014  ? Situational anxiety 03/19/2014  ? Asthmatic bronchitis 08/06/2012  ? Hypogonadism, male 01/28/2012  ? 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 Preventative health care 01/28/2012  ?  ?Allergies: No Known Allergies ?Medications:  ?Current Outpatient Medications:  ?   benzonatate (TESSALON) 100 MG capsule, Take 1 capsule (100 mg total) by mouth 3 (three) times daily as needed for cough., Disp: 30 capsule, Rfl: 0 ?  albuterol (VENTOLIN HFA) 108 (90 Base) MCG/ACT inhaler, Inhale 2 puffs into the lungs every 6 (six) hours as needed for wheezing or shortness of breath., Disp: 8 g, Rfl: 0 ?  amphetamine-dextroamphetamine (ADDERALL) 10 MG tablet, TAKE 1 TO 2 TABLETS BY MOUTH IN THE AFTERNOON., Disp: 60 tablet, Rfl: 0 ?  azithromycin (ZITHROMAX) 250 MG tablet, Take 2 tablets on day 1, then 1 tablet daily on days 2 through 5, Disp: 6 tablet, Rfl: 0 ?  buPROPion (WELLBUTRIN XL) 300 MG 24 hr tablet, TAKE 1 TABLET (300 MG TOTAL) BY MOUTH DAILY., Disp: 90 tablet, Rfl: 0 ?  hydrOXYzine (VISTARIL) 25 MG capsule, Take 1 capsule (25 mg total) by mouth every 6 (six) hours as needed for anxiety., Disp: 60 capsule, Rfl: 0 ?  lisdexamfetamine (VYVANSE) 20 MG capsule, Take 1 capsule (20 mg total) by mouth daily., Disp: 30 capsule, Rfl: 0 ?  PARoxetine (PAXIL) 20 MG tablet, Take 1 tablet (20 mg total) by mouth daily., Disp: 90 tablet, Rfl: 0 ?  predniSONE (STERAPRED UNI-PAK 21 TAB) 10 MG (21) TBPK tablet, 6 day taper - take by mouth as directed for 6 days, Disp: 21 tablet, Rfl: 0 ?  tadalafil (CIALIS) 10 MG tablet, Take 1 tablet (10 mg total) by mouth daily as needed for erectile dysfunction., Disp: 10 tablet, Rfl: 0 ?  testosterone cypionate (DEPOTESTOSTERONE CYPIONATE) 200 MG/ML injection, Inject 1.5 mLs (300 mg total) into the muscle once a week., Disp: 10 mL, Rfl: 0 ? ?Observations/Objective: ?Patient is well-developed, well-nourished in no acute distress.  ?Resting comfortably at home.  ?Head is normocephalic, atraumatic.  ?No labored breathing. ?Speech is clear and coherent with logical content.  ?Patient is alert and oriented at baseline.  ? ?Assessment and Plan: ?1. Upper respiratory tract infection, unspecified type ? ?Diagnosed with bacterial bronchitis 2 days ago and taking antibiotic with  progressing symptoms. Symptoms only present for the past 4 days. Now with more systemic symptoms of aches, chills, flushing -- even in the absence of fever. Want him to take a home COVID taste to make sure this is not the cause. Does not seem flu-like truly and even if the flu, he is outside the window for those antiviral therapies. Supportive measures and OTC medications reviewed. Tessalon per orders.  Will await his result to make further changes.  ? ?Update --  COVID test negative. Will have him continue supportive measures and OTC medications. Continue Tessalon sent in. Stop Azithromycin cause think is making his foggy-headed. Start Augmentin as directed. Rx sent to pharmacy. Strict in-person follow-up discussed.  ? ?Follow Up Instructions: ?I discussed the assessment and treatment plan with the patient. The patient was provided an opportunity to ask questions and all were answered. The patient agreed with the plan and demonstrated an understanding of the instructions.  A copy of instructions were sent to the patient via  MyChart unless otherwise noted below. ? ?The patient was advised to call back or seek an in-person evaluation if the symptoms worsen or if the condition fails to improve as anticipated. ? ?Time:  ?I spent 10 minutes with the patient via telehealth technology discussing the above problems/concerns.   ? ?Leeanne Rio, PA-C ?

## 2022-01-17 NOTE — Addendum Note (Signed)
Addended by: Brunetta Jeans on: 01/17/2022 02:17 PM ? ? Modules accepted: Orders ? ?

## 2022-01-24 ENCOUNTER — Other Ambulatory Visit: Payer: Self-pay

## 2022-01-24 ENCOUNTER — Encounter: Payer: Self-pay | Admitting: Physician Assistant

## 2022-01-24 DIAGNOSIS — Z3009 Encounter for other general counseling and advice on contraception: Secondary | ICD-10-CM

## 2022-01-29 ENCOUNTER — Encounter: Payer: Self-pay | Admitting: *Deleted

## 2022-02-12 ENCOUNTER — Other Ambulatory Visit: Payer: Self-pay | Admitting: Nurse Practitioner

## 2022-02-12 ENCOUNTER — Other Ambulatory Visit: Payer: Self-pay | Admitting: Physician Assistant

## 2022-02-12 DIAGNOSIS — N529 Male erectile dysfunction, unspecified: Secondary | ICD-10-CM

## 2022-02-12 DIAGNOSIS — E291 Testicular hypofunction: Secondary | ICD-10-CM

## 2022-02-13 ENCOUNTER — Telehealth: Payer: Self-pay | Admitting: Physician Assistant

## 2022-02-13 NOTE — Telephone Encounter (Signed)
Patient is asking when does he need to come in for his testosterone labs and CMP? ?

## 2022-02-13 NOTE — Telephone Encounter (Signed)
Patient is supposed to follow up with [facility] in June. I do not see an order for labs at this time. Last testosterone lab was checked in March. ?

## 2022-02-13 NOTE — Telephone Encounter (Signed)
Patient is aware 

## 2022-02-19 NOTE — Progress Notes (Signed)
? ?02/20/2022 ?12:22 PM  ? ?Philip Daugherty ?1976-01-02 ?[id number] ? ?Referring provider: Lorrene Reid, PA-C ?[facility] ?[address] ?[place],  [place] [address] ? ?Chief Complaint  ?Patient presents with  ? VAS Consult  ? ? ?HPI: ?Philip Daugherty is a 46 y.o. male who presents today for further evaluation of vasectomy.  ? ?He denies a history of testicular trauma or pain.  No urinary issues.  No previous scrotal surgeries. ? ?He has 2 children.  ? ?PMH: ?Past Medical History:  ?Diagnosis Date  ? Acute pancreatitis 05/17/2016  ? Admitted to hosp while out of town in [place].  Likely secondary to ETOH.  GB/stone w/u NEG.  ? Allergy to cats   ? takes claritin  ? Asthma   ? childhood  ? Depression   ? wellbutrin helped in the past; pt cites failure to respond to prozac, zoloft, and celexa  ? Fear of flying   ? +insomnia; was on xanax regulary for long period and then abruptly stopped it and had w/drawal seizures.  ? Hypogonadism male 2012 and 2016  ? Managed by Dr. Loanne Drilling ([facility]) as of 04/2017, but this was only brief.  I restarted pt on testos IM at end of Oct 2019.  ? 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 testosterone came up into normal range with clomiphene (Dr. Era Bumpers)  ? Obesity, morbid, BMI 40.0-49.9 ([place])   ? Palpitations   ? 24h Holter.: sinus tachycardia  ? Prediabetes 01/2017  ? A1c 6.3% ; 6.1% Dec 2018.  ? Syncope due to orthostatic hypotension   ? d/c'd from [employer] due to this.  Says if Wt 235 lbs or more then this is not a problem  ? ? ?Surgical History: ?Past Surgical History:  ?Procedure Laterality Date  ? EYE SURGERY Bilateral   ? lasik  ? ? ?Home Medications:  ?Allergies as of 02/20/2022   ?No Known Allergies ?  ? ?  ?Medication List  ?  ? ?  ? Accurate as of Feb 20, 2022 12:22 PM. If you have any questions, ask your nurse or doctor.  ?  ?  ? ?  ? ?STOP taking these medications   ? ?albuterol 108 (90 Base) MCG/ACT inhaler ?Commonly known as: VENTOLIN HFA ?Stopped by: Hollice Espy, MD ?   ?amoxicillin-clavulanate 875-125 MG tablet ?Commonly known as: Augmentin ?Stopped by: Hollice Espy, MD ?  ?benzonatate 100 MG capsule ?Commonly known as: TESSALON ?Stopped by: Hollice Espy, MD ?  ?predniSONE 10 MG (21) Tbpk tablet ?Commonly known as: STERAPRED UNI-PAK 21 TAB ?Stopped by: Hollice Espy, MD ?  ? ?  ? ?TAKE these medications   ? ?amphetamine-dextroamphetamine 10 MG tablet ?Commonly known as: ADDERALL ?TAKE 1 TO 2 TABLETS BY MOUTH IN THE AFTERNOON. ?  ?buPROPion 300 MG 24 hr tablet ?Commonly known as: WELLBUTRIN XL ?TAKE 1 TABLET (300 MG TOTAL) BY MOUTH DAILY. ?  ?hydrOXYzine 25 MG capsule ?Commonly known as: VISTARIL ?Take 1 capsule (25 mg total) by mouth every 6 (six) hours as needed for anxiety. ?  ?lisdexamfetamine 20 MG capsule ?Commonly known as: Vyvanse ?Take 1 capsule (20 mg total) by mouth daily. ?  ?PARoxetine 20 MG tablet ?Commonly known as: Paxil ?Take 1 tablet (20 mg total) by mouth daily. ?  ?tadalafil 10 MG tablet ?Commonly known as: CIALIS ?TAKE 1 TABLET (10 MG TOTAL) BY MOUTH DAILY AS NEEDED FOR ERECTILE DYSFUNCTION. ?  ?testosterone cypionate 200 MG/ML injection ?Commonly known as: DEPOTESTOSTERONE CYPIONATE ?INJECT 1.5 MLS INTO THE MUSCLE  ONCE A WEEK. ?  ? ?  ? ? ?Allergies: No Known Allergies ? ?Family History: ?Family History  ?Problem Relation Age of Onset  ? Depression Mother   ?     bipolar disorder  ? Parkinsonism Mother   ? Diabetes Father   ? Other Father   ?     low testosterone  ? ? ?Social History:  reports that he has never smoked. He has never used smokeless tobacco. He reports current alcohol use. He reports that he does not use drugs. ? ? ?Physical Exam: ?BP (!) 150/102   Pulse (!) 102   Ht '6\' 1"'$  (1.854 m)   Wt 285 lb (129.3 kg)   BMI 37.60 kg/m?   ?Constitutional:  Alert and oriented, No acute distress. ?HEENT: [place] AT, moist mucus membranes.  Trachea midline, no masses. ?Cardiovascular: No clubbing, cyanosis, or edema. ?Respiratory: Normal respiratory effort,  no increased work of breathing. ?GI: Abdomen is soft, nontender, nondistended, no abdominal masses ?GU: Normal phallus.  atrophic testicles very superior tight scrotal ultimately able to palpate his vasa be a difficult exam ?Skin: No rashes, bruises or suspicious lesions. ?Neurologic: Grossly intact, no focal deficits, moving all 4 extremities. ?Psychiatric: Normal mood and affect. ? ? ?Assessment & Plan:   ?1. Vasectomy evaluation ?Today, we discussed what the vas deferens is, where it is located, and its function. We reviewed the procedure for vasectomy, it's risks, benefits, alternatives, and likelihood of achieving his goals. We discussed in detail the procedure, complications, and recovery as well as the need for clearance prior to unprotected intercourse. We discussed that vasectomy does not protect against sexually transmitted diseases. We discussed that this procedure does not result in immediate sterility and that they would need to use other forms of birth control until he has been cleared with negative postvasectomy semen analyses. I explained that the procedure is considered to be permanent and that attempts at reversal have varying degrees of success. These options include vasectomy reversal, sperm retrieval, and in vitro fertilization; these can be very expensive. We discussed the chance of postvasectomy pain syndrome which occurs in less than 5% of patients. I explained to the patient that there is no treatment to resolve this chronic pain, and that if it developed I would not be able to help resolve the issue, but that surgery is generally not needed for correction. I explained there have even been reports of systemic like illness associated with this chronic pain, and that there was no good cure. I explained that vasectomy it is not a 100% reliable form of birth control, and the risk of pregnancy after vasectomy is approximately 1 in 2000 men who had a negative postvasectomy semen analysis or rare  non-motile sperm. I explained that repeat vasectomy was necessary in less than 1% of vasectomy procedures when employing the type of technique that I use. I explained that he should refrain from ejaculation for approximately one week following vasectomy. I explained that there are other options for birth control which are permanent and non-permanent; we discussed these. I explained the rates of surgical complications, such as symptomatic hematoma or infection, are low (1-2%) and vary with the surgeon's experience and criteria used to diagnose the complication.  ? ?- He has challenging anatomy atrophic testicles very superior/ tight scrotal ultimately able to palpate his vaso but exam was difficult examined in a supine and standing position. Discussed risk and benefits of OR versus in office based  procedure; better served doing this under  anesthetics.  ? ?The patient had the opportunity to ask questions to his stated satisfaction. He voiced understanding of the above factors and stated that he has read all the information provided to him and the packets and informed consent. ? ? ?Conley Rolls as a scribe for Hollice Espy, MD.,have documented all relevant documentation on the behalf of Hollice Espy, MD,as directed by  Hollice Espy, MD while in the presence of Hollice Espy, MD. ? ?[facility] ?[address] ?[place], [place] [address] ?[phone] ?

## 2022-02-19 NOTE — H&P (View-Only) (Signed)
02/20/2022 12:22 PM   Philip Daugherty 16-Jun-1976 [id number]  Referring provider: Lorrene Reid, PA-C [place],  [place] [address]  Chief Complaint  Patient presents with   VAS Consult    HPI: Philip Daugherty is a 46 y.o. male who presents today for further evaluation of vasectomy.   He denies a history of testicular trauma or pain.  No urinary issues.  No previous scrotal surgeries.  He has 2 children.   PMH: Past Medical History:  Diagnosis Date   Acute pancreatitis 05/17/2016   Admitted to hosp while out of town in [place].  Likely secondary to ETOH.  GB/stone w/u NEG.   Allergy to cats    takes claritin   Asthma    childhood   Depression    wellbutrin helped in the past; pt cites failure to respond to prozac, zoloft, and celexa   Fear of flying    +insomnia; was on xanax regulary for long period and then abruptly stopped it and had w/drawal seizures.   Hypogonadism male 2012 and 2016   Managed by Dr. Loanne Drilling ([facility]) as of 04/2017, but this was only brief.  I restarted pt on testos IM at end of Oct 2019.   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 Era Bumpers)   Obesity, morbid, BMI 40.0-49.9 ([place])    Palpitations    24h Holter.: sinus tachycardia   Prediabetes 01/2017   A1c 6.3% ; 6.1% Dec 2018.   Syncope due to orthostatic hypotension    d/c'd from [employer] due to this.  Says if Wt 235 lbs or more then this is not a problem    Surgical History: Past Surgical History:  Procedure Laterality Date   EYE SURGERY Bilateral    lasik    Home Medications:  Allergies as of 02/20/2022   No Known Allergies      Medication List        Accurate as of Feb 20, 2022 12:22 PM. If you have any questions, ask your nurse or doctor.          STOP taking these medications    albuterol 108 (90 Base) MCG/ACT inhaler Commonly known as: VENTOLIN HFA Stopped by: Hollice Espy, MD    amoxicillin-clavulanate 875-125 MG tablet Commonly known as: Augmentin Stopped by: Hollice Espy, MD   benzonatate 100 MG capsule Commonly known as: TESSALON Stopped by: Hollice Espy, MD   predniSONE 10 MG (21) Tbpk tablet Commonly known as: STERAPRED UNI-PAK 21 TAB Stopped by: Hollice Espy, MD       TAKE these medications    amphetamine-dextroamphetamine 10 MG tablet Commonly known as: ADDERALL TAKE 1 TO 2 TABLETS BY MOUTH IN THE AFTERNOON.   buPROPion 300 MG 24 hr tablet Commonly known as: WELLBUTRIN XL TAKE 1 TABLET (300 MG TOTAL) BY MOUTH DAILY.   hydrOXYzine 25 MG capsule Commonly known as: VISTARIL Take 1 capsule (25 mg total) by mouth every 6 (six) hours as needed for anxiety.   lisdexamfetamine 20 MG capsule Commonly known as: Vyvanse Take 1 capsule (20 mg total) by mouth daily.   PARoxetine 20 MG tablet Commonly known as: Paxil Take 1 tablet (20 mg total) by mouth daily.   tadalafil 10 MG tablet Commonly known as: CIALIS TAKE 1 TABLET (10 MG TOTAL) BY MOUTH DAILY AS NEEDED FOR ERECTILE DYSFUNCTION.   testosterone cypionate 200 MG/ML injection Commonly known as: DEPOTESTOSTERONE CYPIONATE INJECT 1.5 MLS INTO THE MUSCLE  ONCE A WEEK.        Allergies: No Known Allergies  Family History: Family History  Problem Relation Age of Onset   Depression Mother        bipolar disorder   Parkinsonism Mother    Diabetes Father    Other Father        low testosterone    Social History:  reports that he has never smoked. He has never used smokeless tobacco. He reports current alcohol use. He reports that he does not use drugs.   Physical Exam: BP (!) 150/102   Pulse (!) 102   Ht '6\' 1"'$  (1.854 m)   Wt 285 lb (129.3 kg)   BMI 37.60 kg/m   Constitutional:  Alert and oriented, No acute distress. HEENT: [place] AT, moist mucus membranes.  Trachea midline, no masses. Cardiovascular: No clubbing, cyanosis, or edema. Respiratory: Normal respiratory effort,  no increased work of breathing. GI: Abdomen is soft, nontender, nondistended, no abdominal masses GU: Normal phallus.  atrophic testicles very superior tight scrotal ultimately able to palpate his vasa be a difficult exam Skin: No rashes, bruises or suspicious lesions. Neurologic: Grossly intact, no focal deficits, moving all 4 extremities. Psychiatric: Normal mood and affect.   Assessment & Plan:   1. Vasectomy evaluation Today, we discussed what the vas deferens is, where it is located, and its function. We reviewed the procedure for vasectomy, it's risks, benefits, alternatives, and likelihood of achieving his goals. We discussed in detail the procedure, complications, and recovery as well as the need for clearance prior to unprotected intercourse. We discussed that vasectomy does not protect against sexually transmitted diseases. We discussed that this procedure does not result in immediate sterility and that they would need to use other forms of birth control until he has been cleared with negative postvasectomy semen analyses. I explained that the procedure is considered to be permanent and that attempts at reversal have varying degrees of success. These options include vasectomy reversal, sperm retrieval, and in vitro fertilization; these can be very expensive. We discussed the chance of postvasectomy pain syndrome which occurs in less than 5% of patients. I explained to the patient that there is no treatment to resolve this chronic pain, and that if it developed I would not be able to help resolve the issue, but that surgery is generally not needed for correction. I explained there have even been reports of systemic like illness associated with this chronic pain, and that there was no good cure. I explained that vasectomy it is not a 100% reliable form of birth control, and the risk of pregnancy after vasectomy is approximately 1 in 2000 men who had a negative postvasectomy semen analysis or rare  non-motile sperm. I explained that repeat vasectomy was necessary in less than 1% of vasectomy procedures when employing the type of technique that I use. I explained that he should refrain from ejaculation for approximately one week following vasectomy. I explained that there are other options for birth control which are permanent and non-permanent; we discussed these. I explained the rates of surgical complications, such as symptomatic hematoma or infection, are low (1-2%) and vary with the surgeon's experience and criteria used to diagnose the complication.   - He has challenging anatomy atrophic testicles very superior/ tight scrotal ultimately able to palpate his vaso but exam was difficult examined in a supine and standing position. Discussed risk and benefits of OR versus in office based  procedure; better served doing this under  anesthetics.   The patient had the opportunity to ask questions to his stated satisfaction. He voiced understanding of the above factors and stated that he has read all the information provided to him and the packets and informed consent.   Conley Rolls as a Education administrator for Hollice Espy, MD.,have documented all relevant documentation on the behalf of Hollice Espy, MD,as directed by  Hollice Espy, MD while in the presence of Hollice Espy, MD.  [facility] [address] [place], [place] [address] [phone]

## 2022-02-20 ENCOUNTER — Other Ambulatory Visit: Payer: Self-pay | Admitting: Urology

## 2022-02-20 ENCOUNTER — Ambulatory Visit: Payer: Self-pay | Admitting: Urology

## 2022-02-20 ENCOUNTER — Ambulatory Visit (INDEPENDENT_AMBULATORY_CARE_PROVIDER_SITE_OTHER): Payer: BC Managed Care – PPO | Admitting: Urology

## 2022-02-20 ENCOUNTER — Encounter: Payer: Self-pay | Admitting: Urology

## 2022-02-20 VITALS — BP 150/102 | HR 102 | Ht 73.0 in | Wt 285.0 lb

## 2022-02-20 DIAGNOSIS — Z3009 Encounter for other general counseling and advice on contraception: Secondary | ICD-10-CM

## 2022-02-20 DIAGNOSIS — Z302 Encounter for sterilization: Secondary | ICD-10-CM

## 2022-02-20 NOTE — Patient Instructions (Signed)

## 2022-02-20 NOTE — Progress Notes (Signed)
Surgical Physician Order Form [facility] ? ?* Scheduling expectation : Next Available ? ?*Length of Case:  ? ?*Clearance needed: no ? ?*Anticoagulation Instructions: Hold all anticoagulants ? ?*Aspirin Instructions: Hold Aspirin ? ?*Post-op visit Date/Instructions: 82-monthlab visit for semen analysis ? ?*Diagnosis:  Desires vasectomy/vasectomy evaluation ? ?*Procedure: bilateral Vasectomy (([id number] ? ? ?Additional orders: N/A ? ?-Admit type: OUTpatient ? ?-Anesthesia: General ? ?-VTE Prophylaxis Standing Order SCD?s    ?   ?Other:  ? ?-Standing Lab Orders Per Anesthesia   ? ?Lab other: None ? ?-Standing Test orders EKG/Chest x-ray per Anesthesia      ? ?Test other:  ? ?- Medications:  Ancef 2gm IV ? ?-Other orders:  N/A ? ?Consider doing this on a Friday and [place] if he desires later in the week. ? ? ? ?  ? ?

## 2022-02-26 ENCOUNTER — Telehealth: Payer: Self-pay

## 2022-02-26 NOTE — Telephone Encounter (Signed)
I have called patient to schedule surgery on 05/12 and 02/26/2022, no answer and have left voicemails. Will try again. ?

## 2022-02-28 ENCOUNTER — Other Ambulatory Visit: Payer: Self-pay

## 2022-02-28 DIAGNOSIS — Z9852 Vasectomy status: Secondary | ICD-10-CM

## 2022-02-28 NOTE — Progress Notes (Signed)
[facility] Posting Form  ? ?Surgery Date/Time: Date: 03/05/2022 ? ?Surgeon: Dr. Hollice Espy, MD ? ?Surgery Location: Day Surgery ? ?Inpt ( No  )   Outpt (Yes)   Obs ( No  )  ? ?Diagnosis: Z30.2 Desires Vasectomy ? ?-CPT: 71278  ? ?Surgery: Bilateral Vasectomy ? ?Stop Anticoagulations: Yes and hold ASA ? ?Cardiac/Medical/Pulmonary Clearance needed: no ? ?*Orders entered into EPIC  Date: 02/28/22  ? ?*Case booked in [place]  Date: 02/28/22 ? ?*Notified pt of Surgery: Date: 02/28/22 ? ?PRE-OP UA & CX: no ? ?*Placed into Prior Authorization Work Que Date: 02/28/22 ? ? ?Assistant/laser/rep:No ? ? ? ? ? ? ? ? ? ? ? ? ? ? ? ?

## 2022-02-28 NOTE — Telephone Encounter (Signed)
I spoke with Mr. Philip Daugherty. We have discussed possible surgery dates and Monday May 22nd, 2023 was agreed upon by all parties. Patient given information about surgery date, what to expect pre-operatively and post operatively.  ?We discussed that a Pre-Admission Testing office will be calling to set up the pre-op visit that will take place prior to surgery, and that these appointments are typically done over the phone with a Pre-Admissions RN.  ?Informed patient that our office will communicate any additional care to be provided after surgery. Patients questions or concerns were discussed during our call. Advised to call our office should there be any additional information, questions or concerns that arise. Patient verbalized understanding.  ? ?

## 2022-03-02 ENCOUNTER — Encounter
Admission: RE | Admit: 2022-03-02 | Discharge: 2022-03-02 | Disposition: A | Discharge status: Home / Self Care | Payer: BC Managed Care – PPO | Source: Ambulatory Visit | Attending: Urology | Admitting: Urology

## 2022-03-02 HISTORY — DX: Gastro-esophageal reflux disease without esophagitis: K21.9

## 2022-03-02 HISTORY — DX: Alcohol dependence, uncomplicated: F10.20

## 2022-03-02 HISTORY — DX: Sleep apnea, unspecified: G47.30

## 2022-03-02 HISTORY — DX: Headache, unspecified: R51.9

## 2022-03-02 HISTORY — DX: Other specified behavioral and emotional disorders with onset usually occurring in childhood and adolescence: F98.8

## 2022-03-02 HISTORY — DX: Suicidal ideations: R45.851

## 2022-03-02 HISTORY — DX: Unspecified convulsions: R56.9

## 2022-03-02 HISTORY — DX: Bronchitis, not specified as acute or chronic: J40

## 2022-03-02 NOTE — Patient Instructions (Addendum)
Your procedure is scheduled on:03-05-22 Monday Report to the Registration Desk on the 1st floor of the [facility].Then proceed to the 2nd floor Surgery Desk-Arrive at 10 Am  REMEMBER: Instructions that are not followed completely may result in serious medical risk, up to and including death; or upon the discretion of your surgeon and anesthesiologist your surgery may need to be rescheduled.  Do not eat food OR drink any liquids after midnight the night before surgery.  No gum chewing, lozengers or hard candies  TAKE THESE MEDICATIONS THE MORNING OF SURGERY WITH A SIP OF WATER: -buPROPion (WELLBUTRIN XL)  -PARoxetine (PAXIL)  -You may take hydrOXYzine (VISTARIL) the morning of surgery if needed for anxiety  One week prior to surgery: Stop Anti-inflammatories (NSAIDS) such as Advil, Aleve, Ibuprofen, Motrin, Naproxen, Naprosyn and Aspirin based products such as Excedrin, Goodys Powder, BC Powder.You may however,  take Tylenol if needed for pain up until the day of surgery.  Stop ANY OVER THE COUNTER supplements/vitamins NOW (03-02-22) until after surgery (Multivitamin packet and Creatine Powder)  No Alcohol for 24 hours before or after surgery.  No Smoking including e-cigarettes for 24 hours prior to surgery.  No chewable tobacco products for at least 6 hours prior to surgery.  No nicotine patches on the day of surgery.  Do not use any "recreational" drugs for at least a week prior to your surgery.  Please be advised that the combination of cocaine and anesthesia may have negative outcomes, up to and including death. If you test positive for cocaine, your surgery will be cancelled.  On the morning of surgery brush your teeth with toothpaste and water, you may rinse your mouth with mouthwash if you wish. Do not swallow any toothpaste or mouthwash.  Do not wear jewelry, make-up, hairpins, clips or nail polish.  Do not wear lotions, powders, or perfumes.   Do not shave body from the  neck down 48 hours prior to surgery just in case you cut yourself which could leave a site for infection.  Also, freshly shaved skin may become irritated if using the CHG soap.  Contact lenses, hearing aids and dentures may not be worn into surgery.  Do not bring valuables to the hospital. [facility] is not responsible for any missing/lost belongings or valuables.   Bring your C-PAP to the hospital with you  Notify your doctor if there is any change in your medical condition (cold, fever, infection).  Wear comfortable clothing (specific to your surgery type) to the hospital.  After surgery, you can help prevent lung complications by doing breathing exercises.  Take deep breaths and cough every 1-2 hours. Your doctor may order a device called an Incentive Spirometer to help you take deep breaths. When coughing or sneezing, hold a pillow firmly against your incision with both hands. This is called "splinting." Doing this helps protect your incision. It also decreases belly discomfort.  If you are being admitted to the hospital overnight, leave your suitcase in the car. After surgery it may be brought to your room.  If you are being discharged the day of surgery, you will not be allowed to drive home. You will need a responsible adult (18 years or older) to drive you home and stay with you that night.   If you are taking public transportation, you will need to have a responsible adult (18 years or older) with you. Please confirm with your physician that it is acceptable to use public transportation.   Please call  the Pre-admissions Testing Dept. at [phone] if you have any questions about these instructions.  Surgery Visitation Policy:  Patients undergoing a surgery or procedure may have two family members or support persons with them as long as the person is not COVID-19 positive or experiencing its symptoms.

## 2022-03-05 ENCOUNTER — Ambulatory Visit: Payer: BC Managed Care – PPO | Admitting: Registered Nurse

## 2022-03-05 ENCOUNTER — Encounter
Admission: RE | Disposition: A | Discharge status: Home / Self Care | Payer: Self-pay | Source: Home / Self Care | Attending: Urology

## 2022-03-05 ENCOUNTER — Ambulatory Visit
Admission: RE | Admit: 2022-03-05 | Discharge: 2022-03-05 | Disposition: A | Discharge status: Home / Self Care | Payer: BC Managed Care – PPO | Attending: Urology | Admitting: Urology

## 2022-03-05 ENCOUNTER — Encounter: Payer: Self-pay | Admitting: Urology

## 2022-03-05 DIAGNOSIS — F32A Depression, unspecified: Secondary | ICD-10-CM | POA: Diagnosis not present

## 2022-03-05 DIAGNOSIS — G4730 Sleep apnea, unspecified: Secondary | ICD-10-CM | POA: Insufficient documentation

## 2022-03-05 DIAGNOSIS — Z302 Encounter for sterilization: Secondary | ICD-10-CM

## 2022-03-05 DIAGNOSIS — J45909 Unspecified asthma, uncomplicated: Secondary | ICD-10-CM | POA: Insufficient documentation

## 2022-03-05 DIAGNOSIS — K219 Gastro-esophageal reflux disease without esophagitis: Secondary | ICD-10-CM | POA: Insufficient documentation

## 2022-03-05 DIAGNOSIS — Z9852 Vasectomy status: Secondary | ICD-10-CM | POA: Diagnosis not present

## 2022-03-05 DIAGNOSIS — Z6841 Body Mass Index (BMI) 40.0 and over, adult: Secondary | ICD-10-CM | POA: Diagnosis not present

## 2022-03-05 HISTORY — PX: VASECTOMY: SHX75

## 2022-03-05 SURGERY — VASECTOMY
Anesthesia: General | Laterality: Bilateral

## 2022-03-05 MED ORDER — FAMOTIDINE 20 MG PO TABS: 20.0000 mg | ORAL_TABLET | Freq: Once | ORAL | Status: DC

## 2022-03-05 MED ORDER — ONDANSETRON HCL 4 MG/2ML IJ SOLN
INTRAMUSCULAR | Status: DC | PRN
  Administered 2022-03-05: 4 mg via INTRAVENOUS

## 2022-03-05 MED ORDER — CEFAZOLIN SODIUM-DEXTROSE 2-4 GM/100ML-% IV SOLN
INTRAVENOUS | Status: AC
  Filled 2022-03-05: qty 100

## 2022-03-05 MED ORDER — MIDAZOLAM HCL 2 MG/2ML IJ SOLN
INTRAMUSCULAR | Status: DC | PRN
  Administered 2022-03-05: 2 mg via INTRAVENOUS
  Administered 2022-03-05 (×2): 1 mg via INTRAVENOUS

## 2022-03-05 MED ORDER — FENTANYL CITRATE (PF) 100 MCG/2ML IJ SOLN
INTRAMUSCULAR | Status: AC
  Filled 2022-03-05: qty 2

## 2022-03-05 MED ORDER — MIDAZOLAM HCL 2 MG/2ML IJ SOLN
INTRAMUSCULAR | Status: AC
  Filled 2022-03-05: qty 2

## 2022-03-05 MED ORDER — FAMOTIDINE 20 MG PO TABS
ORAL_TABLET | ORAL | Status: AC
  Filled 2022-03-05: qty 1

## 2022-03-05 MED ORDER — CHLORHEXIDINE GLUCONATE 0.12 % MT SOLN: 15.0000 mL | Freq: Once | OROMUCOSAL | Status: AC

## 2022-03-05 MED ORDER — PROPOFOL 10 MG/ML IV BOLUS
INTRAVENOUS | Status: AC
  Filled 2022-03-05: qty 20

## 2022-03-05 MED ORDER — PROPOFOL 500 MG/50ML IV EMUL
INTRAVENOUS | Status: DC | PRN
  Administered 2022-03-05: 150 ug/kg/min via INTRAVENOUS

## 2022-03-05 MED ORDER — LIDOCAINE HCL (PF) 1 % IJ SOLN
INTRAMUSCULAR | Status: DC | PRN
Start: 2022-03-05 — End: 2022-03-05
  Administered 2022-03-05: 3 mL

## 2022-03-05 MED ORDER — 0.9 % SODIUM CHLORIDE (POUR BTL) OPTIME
TOPICAL | Status: DC | PRN
  Administered 2022-03-05: 150 mL

## 2022-03-05 MED ORDER — PROPOFOL 10 MG/ML IV BOLUS
INTRAVENOUS | Status: DC | PRN
  Administered 2022-03-05: 40 mg via INTRAVENOUS

## 2022-03-05 MED ORDER — LIDOCAINE HCL (PF) 1 % IJ SOLN
INTRAMUSCULAR | Status: AC
  Filled 2022-03-05: qty 30

## 2022-03-05 MED ORDER — ONDANSETRON HCL 4 MG/2ML IJ SOLN
INTRAMUSCULAR | Status: AC
  Filled 2022-03-05: qty 2

## 2022-03-05 MED ORDER — FENTANYL CITRATE (PF) 100 MCG/2ML IJ SOLN: 25.0000 ug | INTRAMUSCULAR | Status: DC | PRN

## 2022-03-05 MED ORDER — ACETAMINOPHEN 10 MG/ML IV SOLN
INTRAVENOUS | Status: AC
  Filled 2022-03-05: qty 100

## 2022-03-05 MED ORDER — CHLORHEXIDINE GLUCONATE 0.12 % MT SOLN
OROMUCOSAL | Status: AC
  Administered 2022-03-05: 15 mL via OROMUCOSAL
  Filled 2022-03-05: qty 15

## 2022-03-05 MED ORDER — FENTANYL CITRATE (PF) 100 MCG/2ML IJ SOLN
INTRAMUSCULAR | Status: DC | PRN
  Administered 2022-03-05: 50 ug via INTRAVENOUS

## 2022-03-05 MED ORDER — DEXMEDETOMIDINE (PRECEDEX) IN NS 20 MCG/5ML (4 MCG/ML) IV SYRINGE
PREFILLED_SYRINGE | INTRAVENOUS | Status: DC | PRN
  Administered 2022-03-05: 8 ug via INTRAVENOUS

## 2022-03-05 MED ORDER — OXYCODONE HCL 5 MG PO TABS: 5.0000 mg | ORAL_TABLET | Freq: Once | ORAL | Status: DC | PRN

## 2022-03-05 MED ORDER — PROMETHAZINE HCL 25 MG/ML IJ SOLN: 6.2500 mg | INTRAMUSCULAR | Status: DC | PRN

## 2022-03-05 MED ORDER — LACTATED RINGERS IV SOLN: INTRAVENOUS | Status: DC

## 2022-03-05 MED ORDER — ORAL CARE MOUTH RINSE: 15.0000 mL | Freq: Once | OROMUCOSAL | Status: AC

## 2022-03-05 MED ORDER — OXYCODONE HCL 5 MG/5ML PO SOLN: 5.0000 mg | Freq: Once | ORAL | Status: DC | PRN

## 2022-03-05 MED ORDER — CEFAZOLIN SODIUM-DEXTROSE 2-4 GM/100ML-% IV SOLN
2.0000 g | INTRAVENOUS | Status: AC
  Administered 2022-03-05: 2 g via INTRAVENOUS

## 2022-03-05 MED ORDER — LIDOCAINE HCL (PF) 2 % IJ SOLN
INTRAMUSCULAR | Status: AC
  Filled 2022-03-05: qty 5

## 2022-03-05 MED ORDER — ACETAMINOPHEN 10 MG/ML IV SOLN: 1000.0000 mg | Freq: Once | INTRAVENOUS | Status: DC | PRN

## 2022-03-05 MED ORDER — PROPOFOL 1000 MG/100ML IV EMUL
INTRAVENOUS | Status: AC
  Filled 2022-03-05: qty 100

## 2022-03-05 MED ORDER — DROPERIDOL 2.5 MG/ML IJ SOLN: 0.6250 mg | Freq: Once | INTRAMUSCULAR | Status: DC | PRN

## 2022-03-05 MED ORDER — ACETAMINOPHEN 10 MG/ML IV SOLN
INTRAVENOUS | Status: DC | PRN
  Administered 2022-03-05: 1000 mg via INTRAVENOUS

## 2022-03-05 SURGICAL SUPPLY — 30 items
ADH SKN CLS APL DERMABOND .7 (GAUZE/BANDAGES/DRESSINGS) ×1
APL PRP STRL LF DISP 70% ISPRP (MISCELLANEOUS) ×1
BLADE SURG 15 STRL LF DISP TIS (BLADE) ×1 IMPLANT
BLADE SURG 15 STRL SS (BLADE) ×2
CHLORAPREP W/TINT 26 (MISCELLANEOUS) ×2 IMPLANT
DERMABOND ADVANCED (GAUZE/BANDAGES/DRESSINGS) ×1
DERMABOND ADVANCED .7 DNX12 (GAUZE/BANDAGES/DRESSINGS) ×1 IMPLANT
DRAPE LAPAROTOMY 77X122 PED (DRAPES) ×2 IMPLANT
DRSG GAUZE FLUFF 36X18 (GAUZE/BANDAGES/DRESSINGS) ×1 IMPLANT
ELECT CAUTERY NEEDLE TIP 1.0 (MISCELLANEOUS) ×2
ELECT REM PT RETURN 9FT ADLT (ELECTROSURGICAL) ×2
ELECTRODE CAUTERY NEDL TIP 1.0 (MISCELLANEOUS) ×1 IMPLANT
ELECTRODE REM PT RTRN 9FT ADLT (ELECTROSURGICAL) ×1 IMPLANT
GAUZE SPONGE 4X4 12PLY STRL (GAUZE/BANDAGES/DRESSINGS) ×1 IMPLANT
GLOVE BIO SURGEON STRL SZ 6.5 (GLOVE) ×2 IMPLANT
GOWN STRL REUS W/ TWL LRG LVL3 (GOWN DISPOSABLE) ×2 IMPLANT
GOWN STRL REUS W/TWL LRG LVL3 (GOWN DISPOSABLE) ×4
KIT TURNOVER KIT A (KITS) ×2 IMPLANT
MANIFOLD NEPTUNE II (INSTRUMENTS) ×2 IMPLANT
NDL HYPO 25X1 1.5 SAFETY (NEEDLE) ×1 IMPLANT
NEEDLE HYPO 25X1 1.5 SAFETY (NEEDLE) ×2 IMPLANT
NS IRRIG 500ML POUR BTL (IV SOLUTION) ×2 IMPLANT
PACK BASIN MINOR ARMC (MISCELLANEOUS) ×2 IMPLANT
SUPPORT SCROTAL LG STRP (MISCELLANEOUS) ×2 IMPLANT
SUT CHROMIC 3 0 PS 2 (SUTURE) ×1 IMPLANT
SUT CHROMIC 4 0 RB 1X27 (SUTURE) ×2 IMPLANT
SUT ETHILON NAB PS2 4-0 18IN (SUTURE) ×1 IMPLANT
SUT VIC AB 3-0 SH 27 (SUTURE) ×2
SUT VIC AB 3-0 SH 27X BRD (SUTURE) IMPLANT
SYR 10ML LL (SYRINGE) ×2 IMPLANT

## 2022-03-05 NOTE — Discharge Instructions (Addendum)
                                                           Vasectomy                                    Patient Education and Post Care    Avoid strenuous activity for 48 hours after your procedure. This includes any heavy lifting. You may return to work the next day, as long as no strenuous activity is required. You may shower after 24 hours. Do not take a bath or use a hot tub for five (5) days. You may apply ice or a cold pack to the scrotum as needed, for 10 minutes of every hours. (A bag of frozen peas will mold to the area). This may help reduce any pain or swelling. It may also be helpful to wear supportive underwear, such as briefs. Expect some mild pain, mild swelling of the scrotum and possible slight fluid leak from the site of the puncture. A gauze pad may be applied to the scrotum if there is any leakage from the puncture site. This drainage may continue for several days and is normal. You may continue your normal diet. After the procedure, you will be given 1-2 specimen cups. You need to do sperm check at12 weeks. Please bring the specimen back to our office to be sent out for analysis. You may resume having sex 1 week after procedure, if comfortable enough. Until you are told that you are sterile, it is essential that you use another form of birth control until otherwise notified by our office. Take Extra-Strength Tylenol, Motrin or Advil as needed for any pain or discomfort. Follow the package directions regarding dose. Stronger prescription pain medication is provided, but most people do not find that it is necessary. If you experience unusual or severe pain that is not relieved by pain medication, excessive bleeding or drainage, excessive swelling or redness, foul odor or a fever over 101 F, please contact our office.  [place], [place] [phone]    AMBULATORY SURGERY  DISCHARGE INSTRUCTIONS   The drugs that you were given will  stay in your system until tomorrow so for the next 24 hours you should not:  Drive an automobile Make any legal decisions Drink any alcoholic beverage   You may resume regular meals tomorrow.  Today it is better to start with liquids and gradually work up to solid foods.  You may eat anything you prefer, but it is better to start with liquids, then soup and crackers, and gradually work up to solid foods.   Please notify your doctor immediately if you have any unusual bleeding, trouble breathing, redness and pain at the surgery site, drainage, fever, or pain not relieved by medication.    Additional Instructions:        Please contact your physician with any problems or Same Day Surgery at [phone], Monday through Friday 6 am to 4 pm, or [place] at [facility] number at [phone].

## 2022-03-05 NOTE — Interval H&P Note (Signed)
History and Physical Interval Note:  03/05/2022 10:39 AM  [name]  has presented today for surgery, with the diagnosis of Desires Vasectomy.  The various methods of treatment have been discussed with the patient and family. After consideration of risks, benefits and other options for treatment, the patient has consented to  Procedure(s): VASECTOMY (Bilateral) as a surgical intervention.  The patient's history has been reviewed, patient examined, no change in status, stable for surgery.  I have reviewed the patient's chart and labs.  Questions were answered to the patient's satisfaction.    RRR CTAB  Hollice Espy

## 2022-03-05 NOTE — Anesthesia Postprocedure Evaluation (Signed)
Anesthesia Post Note  Patient: Philip Daugherty  Procedure(s) Performed: VASECTOMY (Bilateral)  Patient location during evaluation: PACU Anesthesia Type: General Level of consciousness: awake and alert Pain management: pain level controlled Vital Signs Assessment: post-procedure vital signs reviewed and stable Respiratory status: spontaneous breathing, nonlabored ventilation and respiratory function stable Cardiovascular status: blood pressure returned to baseline and stable Postop Assessment: no apparent nausea or vomiting Anesthetic complications: no   No notable events documented.   Last Vitals:  Vitals:   03/05/22 1230 03/05/22 1231  BP: (!) 102/56 (!) 106/56  Pulse: 74 76  Resp: 13 10  Temp:    SpO2: 94% 92%    Last Pain:  Vitals:   03/05/22 1205  TempSrc:   PainSc: 0-No pain                 [place] Ouch

## 2022-03-05 NOTE — Transfer of Care (Signed)
Immediate Anesthesia Transfer of Care Note  Patient: Philip Daugherty  Procedure(s) Performed: VASECTOMY (Bilateral)  Patient Location: PACU  Anesthesia Type:General  Level of Consciousness: drowsy and patient cooperative  Airway & Oxygen Therapy: Patient Spontanous Breathing and Patient connected to face mask oxygen  Post-op Assessment: Report given to RN and Post -op Vital signs reviewed and stable  Post vital signs: Reviewed and stable  Last Vitals:  Vitals Value Taken Time  BP 117/66 03/05/22 1204  Temp    Pulse 80 03/05/22 1206  Resp 13 03/05/22 1207  SpO2 94 % 03/05/22 1206  Vitals shown include unvalidated device data.  Last Pain:  Vitals:   03/05/22 1019  TempSrc: Temporal  PainSc: 0-No pain         Complications: No notable events documented.

## 2022-03-05 NOTE — Op Note (Signed)
Date of procedure: 03/05/22  Preoperative diagnosis:  Desires sterility   Postoperative diagnosis:  Same as above  Procedure: Bilateral vasectomy  Surgeon: Hollice Espy, MD  Anesthesia: MAC  Complications: None  Intraoperative findings: Uncomplicated procedure  EBL: Minimal  Specimens: None  Drains: None  Indication: Philip Daugherty is a 46 y.o. patient with difficult anatomy electing vasectomy.  Due to his history of anxiety as well as poor candidate for office vasectomy, we elected to proceed with vasectomy in the operating room.  After reviewing the management options for treatment, he elected to proceed with the above surgical procedure(s). We have discussed the potential benefits and risks of the procedure, side effects of the proposed treatment, the likelihood of the patient achieving the goals of the procedure, and any potential problems that might occur during the procedure or recuperation. Informed consent has been obtained.  Description of procedure:  The patient was taken to the operating room and sedation was provided.  The patient was placed in the supine position, prepped and draped in the usual sterile fashion, and preoperative antibiotics were administered. A preoperative time-out was performed.   The right vas was isolated.  Lidocaine was used in the skin as well as within the cord itself for cord block.  Dissection was performed such as the vas was isolated and the ring clamp and brought to the level of the skin.  It was then transected in each side was fulgurated using pinpoint electrocautery within the lumen bilaterally.  I did also perform a fascial interposition covering the distal aspect of the vas with some surrounding perivasal tissue.  The vas was returned back into the hemiscrotum and a 4-0 chromic was used in an interrupted fashion to close the side.  Same exact procedure was performed the left.  Unfortunately, the left distal aspect of the Philip Daugherty  retracted prior to being fulgurated and as such, I completed a second procedure as outlined above exactly on the more distal aspect of the Philip Daugherty to ensure that each and was fully fulgurated.  A fascial interposition was also performed on this side.  The skin was closed in the same fashion.  At the end of the procedure, is cleaned and dried and scrotal support device was applied.  He was taken to the recovery area in stable condition.  He will provide a post semen analysis in 3 months.  Hollice Espy, M.D.

## 2022-03-05 NOTE — Anesthesia Preprocedure Evaluation (Addendum)
Anesthesia Evaluation  Patient identified by MRN, date of birth, ID band Patient awake    Reviewed: Allergy & Precautions, NPO status , Patient's Chart, lab work & pertinent test results  Airway Mallampati: III  TM Distance: >3 FB Neck ROM: full    Dental no notable dental hx.    Pulmonary sleep apnea ,    Pulmonary exam normal        Cardiovascular Exercise Tolerance: Good Normal cardiovascular exam  Syncope due to orthostatic hypotension   Neuro/Psych Seizures - (x1 after coming off a medication),  PSYCHIATRIC DISORDERS Depression    GI/Hepatic Neg liver ROS, GERD  Controlled,  Endo/Other  negative endocrine ROS  Renal/GU negative Renal ROS  negative genitourinary   Musculoskeletal   Abdominal (+) + obese,   Peds  Hematology negative hematology ROS (+)   Anesthesia Other Findings Past Medical History: 05/17/2016: Acute pancreatitis     Comment:  Admitted to hosp while out of town in [place].  Likely               secondary to ETOH.  GB/stone w/u NEG. No date: ADD (attention deficit disorder) No date: Alcohol dependence ([place])     Comment:  has cut back since 2013 No date: Allergy to cats     Comment:  takes claritin No date: Asthma     Comment:  childhood No date: Bronchitis No date: Depression     Comment:  wellbutrin helped in the past; pt cites failure to               respond to prozac, zoloft, and celexa No date: Fear of flying     Comment:  +insomnia; was on xanax regulary for long period and               then abruptly stopped it and had w/drawal seizures. No date: GERD (gastroesophageal reflux disease) No date: Headache     Comment:  migraines 2012 and 2016: Hypogonadism male     Comment:  Managed by Dr. Loanne Drilling ([facility]) as of 04/2017, but this was              only brief.  I restarted pt on testos IM at end of Oct               2019. 2012/2013: Male infertility     Comment:  testosterone came up  into normal range with clomiphene               (Dr. Era Bumpers) No date: Obesity, morbid, BMI 40.0-49.9 ([place]) No date: Palpitations     Comment:  24h Holter.: sinus tachycardia 01/2017: Prediabetes     Comment:  A1c 6.3% ; 6.1% Dec 2018. No date: Seizure [facility]     Comment:  x1 after coming off a medication No date: Sleep apnea     Comment:  uses cpap No date: Suicidal ideation No date: Syncope due to orthostatic hypotension     Comment:  d/c'd from [employer] due to this.  Says if Wt 235 lbs or more              then this is not a problem  Past Surgical History: No date: EYE SURGERY; Bilateral     Comment:  lasik No date: FRACTURE SURGERY     Comment:  broken finger with pins     Reproductive/Obstetrics negative OB ROS  Anesthesia Physical Anesthesia Plan  ASA: 2  Anesthesia Plan: General   Post-op Pain Management: Minimal or no pain anticipated   Induction: Intravenous  PONV Risk Score and Plan: Propofol infusion and TIVA  Airway Management Planned: Natural Airway  Additional Equipment:   Intra-op Plan:   Post-operative Plan:   Informed Consent: I have reviewed the patients History and Physical, chart, labs and discussed the procedure including the risks, benefits and alternatives for the proposed anesthesia with the patient or authorized representative who has indicated his/her understanding and acceptance.     Dental Advisory Given  Plan Discussed with: Anesthesiologist, CRNA and Surgeon  Anesthesia Plan Comments:        Anesthesia Quick Evaluation

## 2022-03-05 NOTE — Anesthesia Procedure Notes (Signed)
Procedure Name: MAC Date/Time: 03/05/2022 11:25 AM Performed by: Jerrye Noble, CRNA Pre-anesthesia Checklist: Patient identified, Emergency Drugs available, Suction available and Patient being monitored Patient Re-evaluated:Patient Re-evaluated prior to induction Oxygen Delivery Method: Simple face mask

## 2022-03-06 ENCOUNTER — Encounter: Payer: Self-pay | Admitting: Urology

## 2022-03-09 ENCOUNTER — Other Ambulatory Visit: Payer: Self-pay | Admitting: Physician Assistant

## 2022-03-09 DIAGNOSIS — N529 Male erectile dysfunction, unspecified: Secondary | ICD-10-CM

## 2022-03-09 DIAGNOSIS — F988 Other specified behavioral and emotional disorders with onset usually occurring in childhood and adolescence: Secondary | ICD-10-CM

## 2022-03-13 ENCOUNTER — Encounter: Payer: Self-pay | Admitting: Urology

## 2022-03-19 ENCOUNTER — Encounter: Payer: Self-pay | Admitting: Physician Assistant

## 2022-03-19 ENCOUNTER — Ambulatory Visit (INDEPENDENT_AMBULATORY_CARE_PROVIDER_SITE_OTHER): Payer: BC Managed Care – PPO | Admitting: Physician Assistant

## 2022-03-19 VITALS — BP 121/75 | HR 69 | Temp 97.7°F | Ht 73.0 in | Wt 285.0 lb

## 2022-03-19 DIAGNOSIS — F411 Generalized anxiety disorder: Secondary | ICD-10-CM

## 2022-03-19 DIAGNOSIS — E291 Testicular hypofunction: Secondary | ICD-10-CM

## 2022-03-19 DIAGNOSIS — Z0000 Encounter for general adult medical examination without abnormal findings: Secondary | ICD-10-CM

## 2022-03-19 DIAGNOSIS — F331 Major depressive disorder, recurrent, moderate: Secondary | ICD-10-CM | POA: Diagnosis not present

## 2022-03-19 DIAGNOSIS — F988 Other specified behavioral and emotional disorders with onset usually occurring in childhood and adolescence: Secondary | ICD-10-CM

## 2022-03-19 MED ORDER — LISDEXAMFETAMINE DIMESYLATE 30 MG PO CAPS: 30.0000 mg | ORAL_CAPSULE | Freq: Every day | ORAL | 0 refills | Status: DC

## 2022-03-19 MED ORDER — PAROXETINE HCL 20 MG PO TABS: 10.0000 mg | ORAL_TABLET | Freq: Every day | ORAL | 0 refills | Status: DC

## 2022-03-19 NOTE — Assessment & Plan Note (Signed)
-  Stable. -Continue Paxil 10 mg and Wellbutrin XL 300 mg daily. Take hydroxyzine as needed for panic attack/severe anxiety. -Continue with [facility] therapy sessions. -Will continue to monitor.

## 2022-03-19 NOTE — Assessment & Plan Note (Signed)
-  Stable. -Will continue with Paxil 10 mg and Wellbutrin XL 300 mg daily.  -Recommend to continue with [facility] therapy sessions. -Will continue to monitor.

## 2022-03-19 NOTE — Progress Notes (Signed)
Established patient visit   Patient: Philip Daugherty   DOB: [birth year]   46 y.o. Male  MRN: [id number] Visit Date: 03/19/2022  Chief Complaint  Patient presents with   Follow-up   Subjective    HPI  Patient presents for chronic follow-up. Patient reports Vyvanse 20 mg has seemed to work some but still struggles to focus and concentrate towards the end of the day. Takes Adderall 10 mg in the morning and Vyvanse in the afternoon. Patient reports has been working on weight loss by changing his diet and being more active. Reports is fasting to update labs.  Mood: Reports continues with therapy sessions every 2 months. Denies episodes of panic attack or severe anxiety. Reports medication compliance with Paxil 10 mg. Has not needed to take hydroxyzine for several months.        03/19/2022    8:51 AM 01/02/2022   11:05 AM 12/14/2021    8:55 AM 11/02/2021   11:44 AM 10/10/2021    4:45 PM  Depression screen PHQ 2/9  Decreased Interest 1 0 1 2 1   Down, Depressed, Hopeless 1 0 1 2 0  PHQ - 2 Score 2 0 2 4 1   Altered sleeping 1 1 2 3 3   Tired, decreased energy 1 1 1 2 3   Change in appetite 0 1 0 2 3  Feeling bad or failure about yourself  0 0 1 3 0  Trouble concentrating 0 0 0 1 1  Moving slowly or fidgety/restless 0 0 0 1 0  Suicidal thoughts 0 0 0 2 0  PHQ-9 Score 4 3 6 18 11   Difficult doing work/chores Somewhat difficult  Somewhat difficult Very difficult Somewhat difficult      03/19/2022    8:51 AM 01/02/2022   11:05 AM 12/14/2021    8:56 AM 11/02/2021   11:44 AM  GAD 7 : Generalized Anxiety Score  Nervous, Anxious, on Edge 0 0 0 2  Control/stop worrying 0 0 0 2  Worry too much - different things 0 0 1 2  Trouble relaxing 1 0 2 3  Restless 1 0 0 2  Easily annoyed or irritable 0 0 1 1  Afraid - awful might happen 0 0 0 1  Total GAD 7 Score 2 0 4 13  Anxiety Difficulty Not difficult at all  Somewhat difficult Very difficult        Medications: Outpatient Medications Prior  to Visit  Medication Sig   amphetamine-dextroamphetamine (ADDERALL) 10 MG tablet TAKE 1 TO 2 TABLETS BY MOUTH IN THE AFTERNOON.   buPROPion (WELLBUTRIN XL) 300 MG 24 hr tablet TAKE 1 TABLET (300 MG TOTAL) BY MOUTH DAILY. (Patient taking differently: Take 300 mg by mouth every morning.)   hydrOXYzine (VISTARIL) 25 MG capsule Take 1 capsule (25 mg total) by mouth every 6 (six) hours as needed for anxiety.   tadalafil (CIALIS) 10 MG tablet TAKE 1 TABLET (10 MG TOTAL) BY MOUTH DAILY AS NEEDED FOR ERECTILE DYSFUNCTION.   testosterone cypionate (DEPOTESTOSTERONE CYPIONATE) 200 MG/ML injection INJECT 1.5 MLS INTO THE MUSCLE ONCE A WEEK.   [DISCONTINUED] PARoxetine (PAXIL) 20 MG tablet Take 1 tablet (20 mg total) by mouth daily. (Patient taking differently: Take 10 mg by mouth every morning.)   [DISCONTINUED] VYVANSE 20 MG capsule TAKE 1 CAPSULE (20 MG TOTAL) BY MOUTH DAILY. EFFECTIVE 01/11/2022   [DISCONTINUED] Creatine POWD Take 1 Scoop by mouth daily at 6 (six) AM.   [DISCONTINUED] OVER THE COUNTER MEDICATION Take 7 tablets  by mouth daily at 6 (six) AM. Multivitamin Packet   [DISCONTINUED] UNABLE TO FIND Take 1 tablet by mouth at bedtime. Med Name: Delta 8 gummies   No facility-administered medications prior to visit.    Review of Systems Review of Systems:  A fourteen system review of systems was performed and found to be positive as per HPI.  Last CBC Lab Results  Component Value Date   WBC 7.6 10/13/2021   HGB 17.2 10/13/2021   HCT 50 10/13/2021   MCV 86 10/13/2021   MCH 29.4 10/13/2021   RDW 12.7 10/13/2021   PLT 349 28/41/3244   Last metabolic panel Lab Results  Component Value Date   GLUCOSE 103 (H) 02/21/2021   NA 140 10/13/2021   K 4.6 10/13/2021   CL 101 10/13/2021   CO2 25 10/13/2021   BUN 13 10/13/2021   CREATININE 1.1 10/13/2021   EGFR 83 10/13/2021   CALCIUM 9.8 10/13/2021   CALCIUM 9.8 10/13/2021   PHOS 3.1 10/13/2021   PROT 7.1 10/13/2021   ALBUMIN 4.6  10/13/2021   LABGLOB 2.4 02/21/2021   AGRATIO 1.9 02/21/2021   BILITOT 0.5 02/21/2021   ALKPHOS 92 10/13/2021   ALKPHOS 92 10/13/2021   AST 36 10/13/2021   ALT 60 (A) 10/13/2021   ANIONGAP 9 01/10/2021   Last lipids Lab Results  Component Value Date   CHOL 175 10/13/2021   HDL 34 (A) 10/13/2021   LDLCALC 118 10/13/2021   TRIG 127 10/13/2021   CHOLHDL 5.1 10/13/2021   Last hemoglobin A1c Lab Results  Component Value Date   HGBA1C 6.1 (H) 08/22/2020   Last thyroid functions Lab Results  Component Value Date   TSH 2.540 08/22/2020   Last vitamin D No results found for: 25OHVITD2, 25OHVITD3, VD25OH   Objective    BP 121/75   Pulse 69   Temp 97.7 F (36.5 C)   Ht $R'6\' 1"'Uh$  (1.854 m)   Wt 285 lb (129.3 kg)   SpO2 95%   BMI 37.60 kg/m  BP Readings from Last 3 Encounters:  03/19/22 121/75  03/05/22 127/78  02/20/22 (!) 150/102   Wt Readings from Last 3 Encounters:  03/19/22 285 lb (129.3 kg)  03/05/22 285 lb (129.3 kg)  02/20/22 285 lb (129.3 kg)    Physical Exam  General:  Well Developed, well nourished, appropriate for stated age.  Neuro:  Alert and oriented,  extra-ocular muscles intact  HEENT:  Normocephalic, atraumatic, neck supple  Skin:  no gross rash, warm, pink. Cardiac:  RRR, S1 S2 Respiratory: CTA B/L  Vascular:  Ext warm, no cyanosis apprec.; cap RF less 2 sec. Psych:  No HI/SI, judgement and insight good, Euthymic mood. Full Affect.   No results found for any visits on 03/19/22.  Assessment & Plan      Problem List Items Addressed This Visit       Endocrine   Hypogonadism, male    -Testosterone therapy adjusted to 1.5 mls once a week last lab check. Will repeat testosterone labs today. Testosterone  Date Value Ref Range Status  12/14/2021 98 (L) 264 - 916 ng/dL Final    Comment:    Adult male reference interval is based on a population of healthy nonobese males (BMI <30) between 79 and 72 years old. [place], [place]  [phone]. PMID: 74259563.   02/21/2021 307 264 - 916 ng/dL Final    Comment:    Adult male reference interval is based on a population of healthy nonobese males (  BMI <30) between 23 and 27 years old. [place], [place] [phone]. PMID: [id number].   11/24/2020 89 (L) 264 - 916 ng/dL Final    Comment:    Adult male reference interval is based on a population of healthy nonobese males (BMI <30) between 59 and 77 years old. [place], [place] [phone]. PMID: 15379432.    Testosterone, Free  Date Value Ref Range Status  12/14/2021 4.2 (L) 6.8 - 21.5 pg/mL Final  08/22/2020 6.4 (L) 6.8 - 21.5 pg/mL Final  07/24/2018 See below 46.0 - 224.0 pg/mL Final        Relevant Orders   Testosterone,Free and Total     Other   Major depressive disorder, recurrent episode, moderate ([place]) - Primary    -Stable. -Will continue with Paxil 10 mg and Wellbutrin XL 300 mg daily.  -Recommend to continue with [facility] therapy sessions. -Will continue to monitor.       Relevant Medications   PARoxetine (PAXIL) 20 MG tablet   Morbid obesity ([place])    -Patient has lost 13 pounds since last visit. Encourage to continue weight loss efforts with diet and lifestyle changes.       Relevant Medications   lisdexamfetamine (VYVANSE) 30 MG capsule   Attention deficit disorder (ADD) without hyperactivity    -Discussed with patient increasing Vyvanse to 30 mg and is agreeable. PDMP reviewed, advised unsure if insurance will allow fill of medication since Vyvanse 20 mg recently filled. Recommend to continue with Adderall 10 mg once daily, not due for refill yet. Follow-up in 3 months for medication management.       Relevant Medications   lisdexamfetamine (VYVANSE) 30 MG capsule   GAD (generalized anxiety disorder)    -Stable. -Continue Paxil 10 mg and Wellbutrin XL 300 mg daily. Take hydroxyzine as needed for panic attack/severe anxiety. -Continue with BH therapy  sessions. -Will continue to monitor.       Relevant Medications   PARoxetine (PAXIL) 20 MG tablet   Other Visit Diagnoses     Healthcare maintenance       Relevant Orders   CBC w/Diff   Comp Met (CMET)   Lipid Profile   HgB A1c       Return in about 3 months (around 06/19/2022) for ADD, Mood, Wt.        Lorrene Reid, PA-C  [facility] at [facility] [phone] (phone) [phone] (fax)  [place]

## 2022-03-19 NOTE — Assessment & Plan Note (Signed)
-  Discussed with patient increasing Vyvanse to 30 mg and is agreeable. PDMP reviewed, advised unsure if insurance will allow fill of medication since Vyvanse 20 mg recently filled. Recommend to continue with Adderall 10 mg once daily, not due for refill yet. Follow-up in 3 months for medication management.

## 2022-03-19 NOTE — Assessment & Plan Note (Signed)
-  Testosterone therapy adjusted to 1.5 mls once a week last lab check. Will repeat testosterone labs today. Testosterone  Date Value Ref Range Status  12/14/2021 98 (L) 264 - 916 ng/dL Final    Comment:    Adult male reference interval is based on a population of healthy nonobese males (BMI <30) between 50 and 46 years old. [place], [place] [phone]. PMID: 78478412.   02/21/2021 307 264 - 916 ng/dL Final    Comment:    Adult male reference interval is based on a population of healthy nonobese males (BMI <30) between 32 and 27 years old. [place], [place] [phone]. PMID: 71855015.   11/24/2020 89 (L) 264 - 916 ng/dL Final    Comment:    Adult male reference interval is based on a population of healthy nonobese males (BMI <30) between 34 and 39 years old. [place], [place] [phone]. PMID: 71595396.    Testosterone, Free  Date Value Ref Range Status  12/14/2021 4.2 (L) 6.8 - 21.5 pg/mL Final  08/22/2020 6.4 (L) 6.8 - 21.5 pg/mL Final  07/24/2018 See below 46.0 - 224.0 pg/mL Final

## 2022-03-19 NOTE — Assessment & Plan Note (Signed)
-  Patient has lost 13 pounds since last visit. Encourage to continue weight loss efforts with diet and lifestyle changes.

## 2022-03-19 NOTE — Patient Instructions (Signed)
Healthy Living: What to [place] Team About Exercise Exercise is an important part of a healthy lifestyle. You will learn questions to ask your health care team about exercise. To view the content, go to this web address: [email or web address]  This video will expire on: 01/02/2024. If you need access to this video following this date, please reach out to the healthcare provider who assigned it to you. This information is not intended to replace advice given to you by your health care provider. Make sure you discuss any questions you have with your health care provider. Elsevier Patient Education  [place].

## 2022-03-20 LAB — CBC WITH DIFFERENTIAL/PLATELET
Basophils Absolute: 0.1 10*3/uL (ref 0.0–0.2)
Basos: 1 %
EOS (ABSOLUTE): 0.2 10*3/uL (ref 0.0–0.4)
Eos: 2 %
Hematocrit: 54.4 % — ABNORMAL HIGH (ref 37.5–51.0)
Hemoglobin: 17.8 g/dL — ABNORMAL HIGH (ref 13.0–17.7)
Immature Grans (Abs): 0.1 10*3/uL (ref 0.0–0.1)
Immature Granulocytes: 1 %
Lymphocytes Absolute: 1.3 10*3/uL (ref 0.7–3.1)
Lymphs: 16 %
MCH: 28.5 pg (ref 26.6–33.0)
MCHC: 32.7 g/dL (ref 31.5–35.7)
MCV: 87 fL (ref 79–97)
Monocytes Absolute: 0.9 10*3/uL (ref 0.1–0.9)
Monocytes: 10 %
Neutrophils Absolute: 6 10*3/uL (ref 1.4–7.0)
Neutrophils: 70 %
Platelets: 364 10*3/uL (ref 150–450)
RBC: 6.25 x10E6/uL — ABNORMAL HIGH (ref 4.14–5.80)
RDW: 13.1 % (ref 11.6–15.4)
WBC: 8.5 10*3/uL (ref 3.4–10.8)

## 2022-03-20 LAB — COMPREHENSIVE METABOLIC PANEL
ALT: 33 IU/L (ref 0–44)
AST: 29 IU/L (ref 0–40)
Albumin/Globulin Ratio: 1.9 (ref 1.2–2.2)
Albumin: 4.7 g/dL (ref 4.0–5.0)
Alkaline Phosphatase: 83 IU/L (ref 44–121)
BUN/Creatinine Ratio: 9 (ref 9–20)
BUN: 11 mg/dL (ref 6–24)
Bilirubin Total: 0.8 mg/dL (ref 0.0–1.2)
CO2: 26 mmol/L (ref 20–29)
Calcium: 10.1 mg/dL (ref 8.7–10.2)
Chloride: 96 mmol/L (ref 96–106)
Creatinine, Ser: 1.22 mg/dL (ref 0.76–1.27)
Globulin, Total: 2.5 g/dL (ref 1.5–4.5)
Glucose: 86 mg/dL (ref 70–99)
Potassium: 4.6 mmol/L (ref 3.5–5.2)
Sodium: 136 mmol/L (ref 134–144)
Total Protein: 7.2 g/dL (ref 6.0–8.5)
eGFR: 75 mL/min/{1.73_m2} (ref >59)

## 2022-03-20 LAB — LIPID PANEL
Chol/HDL Ratio: 4.5 ratio (ref 0.0–5.0)
Cholesterol, Total: 163 mg/dL (ref 100–199)
HDL: 36 mg/dL — ABNORMAL LOW (ref >39)
LDL Chol Calc (NIH): 108 mg/dL — ABNORMAL HIGH (ref 0–99)
Triglycerides: 104 mg/dL (ref 0–149)
VLDL Cholesterol Cal: 19 mg/dL (ref 5–40)

## 2022-03-20 LAB — TESTOSTERONE,FREE AND TOTAL
Testosterone, Free: 13.1 pg/mL (ref 6.8–21.5)
Testosterone: 295 ng/dL (ref 264–916)

## 2022-03-20 LAB — HEMOGLOBIN A1C
Est. average glucose Bld gHb Est-mCnc: 117 mg/dL
Hgb A1c MFr Bld: 5.7 % — ABNORMAL HIGH (ref 4.8–5.6)

## 2022-03-29 ENCOUNTER — Other Ambulatory Visit: Payer: Self-pay | Admitting: Physician Assistant

## 2022-03-29 DIAGNOSIS — N529 Male erectile dysfunction, unspecified: Secondary | ICD-10-CM

## 2022-03-30 ENCOUNTER — Encounter: Payer: Self-pay | Admitting: Physician Assistant

## 2022-04-02 NOTE — Telephone Encounter (Signed)
Ok thanks 

## 2022-04-23 ENCOUNTER — Other Ambulatory Visit: Payer: Self-pay | Admitting: Physician Assistant

## 2022-04-23 DIAGNOSIS — E291 Testicular hypofunction: Secondary | ICD-10-CM

## 2022-04-23 DIAGNOSIS — N529 Male erectile dysfunction, unspecified: Secondary | ICD-10-CM

## 2022-04-23 DIAGNOSIS — F988 Other specified behavioral and emotional disorders with onset usually occurring in childhood and adolescence: Secondary | ICD-10-CM

## 2022-04-26 ENCOUNTER — Other Ambulatory Visit: Payer: Self-pay | Admitting: Physician Assistant

## 2022-04-26 DIAGNOSIS — F988 Other specified behavioral and emotional disorders with onset usually occurring in childhood and adolescence: Secondary | ICD-10-CM

## 2022-04-26 DIAGNOSIS — E291 Testicular hypofunction: Secondary | ICD-10-CM

## 2022-04-26 DIAGNOSIS — N529 Male erectile dysfunction, unspecified: Secondary | ICD-10-CM

## 2022-04-27 ENCOUNTER — Other Ambulatory Visit: Payer: Self-pay | Admitting: Physician Assistant

## 2022-04-27 ENCOUNTER — Encounter: Payer: Self-pay | Admitting: Physician Assistant

## 2022-04-27 DIAGNOSIS — F988 Other specified behavioral and emotional disorders with onset usually occurring in childhood and adolescence: Secondary | ICD-10-CM

## 2022-04-27 MED ORDER — AMPHETAMINE-DEXTROAMPHETAMINE 10 MG PO TABS: ORAL_TABLET | ORAL | 0 refills | Status: DC

## 2022-04-27 MED ORDER — TESTOSTERONE CYPIONATE 200 MG/ML IM SOLN: 300.0000 mg | INTRAMUSCULAR | 0 refills | Status: DC

## 2022-04-30 ENCOUNTER — Other Ambulatory Visit: Payer: Self-pay | Admitting: Physician Assistant

## 2022-04-30 DIAGNOSIS — F331 Major depressive disorder, recurrent, moderate: Secondary | ICD-10-CM

## 2022-05-17 ENCOUNTER — Other Ambulatory Visit: Payer: Self-pay | Admitting: Physician Assistant

## 2022-05-17 DIAGNOSIS — N529 Male erectile dysfunction, unspecified: Secondary | ICD-10-CM

## 2022-06-01 ENCOUNTER — Other Ambulatory Visit: Payer: Self-pay | Admitting: Physician Assistant

## 2022-06-01 DIAGNOSIS — N529 Male erectile dysfunction, unspecified: Secondary | ICD-10-CM

## 2022-06-01 DIAGNOSIS — F331 Major depressive disorder, recurrent, moderate: Secondary | ICD-10-CM

## 2022-06-04 ENCOUNTER — Other Ambulatory Visit: Payer: Self-pay | Admitting: Physician Assistant

## 2022-06-04 DIAGNOSIS — N529 Male erectile dysfunction, unspecified: Secondary | ICD-10-CM

## 2022-06-05 ENCOUNTER — Other Ambulatory Visit: Payer: BC Managed Care – PPO

## 2022-06-05 ENCOUNTER — Encounter: Payer: Self-pay | Admitting: Urology

## 2022-06-12 ENCOUNTER — Other Ambulatory Visit: Payer: Self-pay | Admitting: Physician Assistant

## 2022-06-12 DIAGNOSIS — E291 Testicular hypofunction: Secondary | ICD-10-CM

## 2022-06-19 ENCOUNTER — Other Ambulatory Visit: Payer: Self-pay | Admitting: Physician Assistant

## 2022-06-19 DIAGNOSIS — N529 Male erectile dysfunction, unspecified: Secondary | ICD-10-CM

## 2022-06-19 DIAGNOSIS — F988 Other specified behavioral and emotional disorders with onset usually occurring in childhood and adolescence: Secondary | ICD-10-CM

## 2022-06-19 NOTE — Telephone Encounter (Signed)
I am refilling these for 30 days. He does need follow up to conitnue refills.

## 2022-07-02 ENCOUNTER — Other Ambulatory Visit: Payer: Self-pay | Admitting: Nurse Practitioner

## 2022-07-02 ENCOUNTER — Other Ambulatory Visit: Payer: Self-pay | Admitting: Physician Assistant

## 2022-07-02 DIAGNOSIS — F331 Major depressive disorder, recurrent, moderate: Secondary | ICD-10-CM

## 2022-07-02 DIAGNOSIS — F411 Generalized anxiety disorder: Secondary | ICD-10-CM

## 2022-07-02 DIAGNOSIS — N529 Male erectile dysfunction, unspecified: Secondary | ICD-10-CM

## 2022-07-09 ENCOUNTER — Other Ambulatory Visit: Payer: Self-pay | Admitting: Nurse Practitioner

## 2022-07-09 DIAGNOSIS — N529 Male erectile dysfunction, unspecified: Secondary | ICD-10-CM

## 2022-07-14 ENCOUNTER — Other Ambulatory Visit: Payer: Self-pay | Admitting: Nurse Practitioner

## 2022-07-14 DIAGNOSIS — N529 Male erectile dysfunction, unspecified: Secondary | ICD-10-CM

## 2022-07-16 ENCOUNTER — Other Ambulatory Visit: Payer: Self-pay | Admitting: Nurse Practitioner

## 2022-07-16 DIAGNOSIS — N529 Male erectile dysfunction, unspecified: Secondary | ICD-10-CM

## 2022-07-16 NOTE — Telephone Encounter (Signed)
This came to me. I haven't seen him yet and he has no follow ups on file.

## 2022-07-17 ENCOUNTER — Telehealth: Payer: Self-pay

## 2022-07-17 NOTE — Telephone Encounter (Signed)
Patient called office stating that his pharmacy informed him that he'd need to make an appointment for his medication refill, patient is scheduled for 10/5, but is out of his tadalafil, please advise, thanks!

## 2022-07-18 ENCOUNTER — Other Ambulatory Visit: Payer: Self-pay | Admitting: Physician Assistant

## 2022-07-18 ENCOUNTER — Encounter: Payer: Self-pay | Admitting: Physician Assistant

## 2022-07-18 DIAGNOSIS — N529 Male erectile dysfunction, unspecified: Secondary | ICD-10-CM

## 2022-07-18 NOTE — Telephone Encounter (Signed)
Patient has appointment on 10/5 refill will be addressed then

## 2022-07-19 ENCOUNTER — Encounter: Payer: Self-pay | Admitting: Physician Assistant

## 2022-07-19 ENCOUNTER — Ambulatory Visit (INDEPENDENT_AMBULATORY_CARE_PROVIDER_SITE_OTHER): Payer: BC Managed Care – PPO | Admitting: Physician Assistant

## 2022-07-19 VITALS — BP 133/84 | HR 101 | Temp 98.0°F | Ht 73.0 in | Wt 279.0 lb

## 2022-07-19 DIAGNOSIS — F411 Generalized anxiety disorder: Secondary | ICD-10-CM | POA: Diagnosis not present

## 2022-07-19 DIAGNOSIS — E291 Testicular hypofunction: Secondary | ICD-10-CM | POA: Diagnosis not present

## 2022-07-19 DIAGNOSIS — F331 Major depressive disorder, recurrent, moderate: Secondary | ICD-10-CM

## 2022-07-19 DIAGNOSIS — N529 Male erectile dysfunction, unspecified: Secondary | ICD-10-CM

## 2022-07-19 DIAGNOSIS — F988 Other specified behavioral and emotional disorders with onset usually occurring in childhood and adolescence: Secondary | ICD-10-CM | POA: Diagnosis not present

## 2022-07-19 MED ORDER — LISDEXAMFETAMINE DIMESYLATE 30 MG PO CAPS: 30.0000 mg | ORAL_CAPSULE | Freq: Every day | ORAL | 0 refills | Status: DC

## 2022-07-19 MED ORDER — TADALAFIL 10 MG PO TABS: 10.0000 mg | ORAL_TABLET | ORAL | 2 refills | Status: DC | PRN

## 2022-07-19 MED ORDER — AMPHETAMINE-DEXTROAMPHETAMINE 10 MG PO TABS: ORAL_TABLET | ORAL | 0 refills | Status: DC

## 2022-07-19 NOTE — Assessment & Plan Note (Signed)
-  Stable. PDMP reviewed, no aberrancies noted. Provided medication refills for Vyvanse 30 mg and Adderall 10 mg. Recommend to follow-up in 3 months for medication management.

## 2022-07-19 NOTE — Assessment & Plan Note (Signed)
-  Patient on testosterone cypionate 1.5 mls once a week. Last CBC (03/19/2022), RBC and hemoglobin elevated. Patient advised to repeat CBC in 2 weeks, discussed to schedule lab appointment. 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  Testosterone  Date Value Ref Range Status  03/19/2022 295 264 - 916 ng/dL Final    Comment:    Adult male reference interval is based on a population of healthy nonobese males (BMI <30) between 53 and 78 years old. [place], [place] [phone]. PMID: 48016553.   12/14/2021 98 (L) 264 - 916 ng/dL Final    Comment:    Adult male reference interval is based on a population of healthy nonobese males (BMI <30) between 36 and 39 years old. [place], [place] [phone]. PMID: 01007121.   02/21/2021 307 264 - 916 ng/dL Final    Comment:    Adult male reference interval is based on a population of healthy nonobese males (BMI <30) between 61 and 21 years old. [place], [place] [phone]. PMID: 58309407.    Testosterone, Free  Date Value Ref Range Status  03/19/2022 13.1 6.8 - 21.5 pg/mL Final  12/14/2021 4.2 (L) 6.8 - 21.5 pg/mL Final  08/22/2020 6.4 (L) 6.8 - 21.5 pg/mL Final

## 2022-07-19 NOTE — Patient Instructions (Signed)

## 2022-07-19 NOTE — Progress Notes (Signed)
Established patient visit   Patient: Philip Daugherty   DOB: 1975/10/21   46 y.o. Male  MRN: [id number] Visit Date: 07/19/2022  Chief Complaint  Patient presents with   Medication Refill   Subjective    HPI  Patient presents for chronic follow-up visit. Patient reports his mood has been stable. Continues with therapy sessions about every 3 months. Taking medications as directed without issues. Denies SI/HI. Taking Vyvanse and Adderall without problems. No chest pain, palpitations or mood changes. No changes with focus/concentration. Patient reports taking Cialis 10 mg about every other day. Has no issues with medication.       07/19/2022    2:13 PM 03/19/2022    8:51 AM 01/02/2022   11:05 AM 12/14/2021    8:55 AM 11/02/2021   11:44 AM  Depression screen PHQ 2/9  Decreased Interest 0 1 0 1 2  Down, Depressed, Hopeless 0 1 0 1 2  PHQ - 2 Score 0 2 0 2 4  Altered sleeping 0 _0 Tired, decreased energy 0 _1 Change in appetite 0 0 1 0 2  Feeling bad or failure about yourself  0 0 0 1 3  Trouble concentrating 0 0 0 0 1  Moving slowly or fidgety/restless 0 0 0 0 1  Suicidal thoughts 0 0 0 0 2  PHQ-9 Score 0 _2 Difficult doing work/chores Not difficult at all Somewhat difficult  Somewhat difficult Very difficult      07/19/2022    2:13 PM 03/19/2022    8:51 AM 01/02/2022   11:05 AM 12/14/2021    8:56 AM  GAD 7 : Generalized Anxiety Score  Nervous, Anxious, on Edge 0 0 0 0  Control/stop worrying 0 0 0 0  Worry too much - different things 0 0 0 1  Trouble relaxing 0 1 0 2  Restless 0 1 0 0  Easily annoyed or irritable 0 0 0 1  Afraid - awful might happen 0 0 0 0  Total GAD 7 Score 0 2 0 4  Anxiety Difficulty Not difficult at all Not difficult at all  Somewhat difficult        Medications: Outpatient Medications Prior to Visit  Medication Sig   buPROPion (WELLBUTRIN XL) 300 MG 24 hr tablet TAKE 1 TABLET (300 MG TOTAL) BY MOUTH DAILY.   hydrOXYzine (VISTARIL) 25  MG capsule Take 1 capsule (25 mg total) by mouth every 6 (six) hours as needed for anxiety.   PARoxetine (PAXIL) 20 MG tablet TAKE 1 TABLET (20 MG TOTAL) BY MOUTH DAILY.   testosterone cypionate (DEPOTESTOSTERONE CYPIONATE) 200 MG/ML injection INJECT 1.5 MLS INTO THE MUSCLE ONCE A WEEK.   [DISCONTINUED] amphetamine-dextroamphetamine (ADDERALL) 10 MG tablet TAKE 1 TO 2 TABLETS BY MOUTH IN THE AFTERNOON.   [DISCONTINUED] tadalafil (CIALIS) 5 MG tablet TAKE 2 TABLETS (10 MG TOTAL) BY MOUTH DAILY AS NEEDED FOR ERECTILE DYSFUNCTION.   [DISCONTINUED] VYVANSE 30 MG capsule TAKE 1 CAPSULE (30 MG TOTAL) BY MOUTH DAILY.   No facility-administered medications prior to visit.    Review of Systems Review of Systems:  A fourteen system review of systems was performed and found to be positive as per HPI.  Last CBC Lab Results  Component Value Date   WBC 8.5 03/19/2022   HGB 17.8 (H) 03/19/2022   HCT 54.4 (H) 03/19/2022   MCV 87 03/19/2022   MCH 28.5 03/19/2022   RDW 13.1 03/19/2022  PLT 364 13/24/4010   Last metabolic panel Lab Results  Component Value Date   GLUCOSE 86 03/19/2022   NA 136 03/19/2022   K 4.6 03/19/2022   CL 96 03/19/2022   CO2 26 03/19/2022   BUN 11 03/19/2022   CREATININE 1.22 03/19/2022   EGFR 75 03/19/2022   CALCIUM 10.1 03/19/2022   PHOS 3.1 10/13/2021   PROT 7.2 03/19/2022   ALBUMIN 4.7 03/19/2022   LABGLOB 2.5 03/19/2022   AGRATIO 1.9 03/19/2022   BILITOT 0.8 03/19/2022   ALKPHOS 83 03/19/2022   AST 29 03/19/2022   ALT 33 03/19/2022   ANIONGAP 9 01/10/2021   Last lipids Lab Results  Component Value Date   CHOL 163 03/19/2022   HDL 36 (L) 03/19/2022   LDLCALC 108 (H) 03/19/2022   TRIG 104 03/19/2022   CHOLHDL 4.5 03/19/2022   Last hemoglobin A1c Lab Results  Component Value Date   HGBA1C 5.7 (H) 03/19/2022   Last thyroid functions Lab Results  Component Value Date   TSH 2.540 08/22/2020     Objective    BP 133/84   Pulse (!) 101   Temp 98  F (36.7 C) (Temporal)   Ht _0  (1.854 m)   Wt 279 lb (126.6 kg)   BMI 36.81 kg/m  BP Readings from Last 3 Encounters:  07/19/22 133/84  03/19/22 121/75  03/05/22 127/78   Wt Readings from Last 3 Encounters:  07/19/22 279 lb (126.6 kg)  03/19/22 285 lb (129.3 kg)  03/05/22 285 lb (129.3 kg)    Physical Exam  General:  Cooperative, in no acute distress, appropriate for stated age.  Neuro:  Alert and oriented,  extra-ocular muscles intact  HEENT:  Normocephalic, atraumatic, neck supple  Skin:  no gross rash, warm, pink. Respiratory: Speaking in full sentences, unlabored. Vascular:  Ext warm, no cyanosis apprec.; no gross edema. Psych:  No HI/SI, judgement and insight good, Euthymic mood.   No results found for any visits on 07/19/22.  Assessment & Plan      Problem List Items Addressed This Visit       Endocrine   Hypogonadism, male    -Patient on testosterone cypionate 1.5 mls once a week. Last CBC (03/19/2022), RBC and hemoglobin elevated. Patient advised to repeat CBC in 2 weeks, discussed to schedule lab appointment. 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  Testosterone  Date Value Ref Range Status  03/19/2022 295 264 - 916 ng/dL Final    Comment:    Adult male reference interval is based on a population of healthy nonobese males (BMI <30) between 80 and 26 years old. [place], [place] [phone]. PMID: [id number].   12/14/2021 98 (L) 264 - 916 ng/dL Final    Comment:    Adult male reference interval is based on a population of healthy nonobese males (BMI <30) between 81 and 47 years old. [place], [place] [phone]. PMID: 63016010.   02/21/2021 307 264 - 916 ng/dL Final    Comment:    Adult male reference interval is based on a population of healthy nonobese males (BMI <30) between 75 and 28 years old. [place], [place] [phone]. PMID: 70623762.    Testosterone, Free  Date Value Ref Range  Status  03/19/2022 13.1 6.8 - 21.5 pg/mL Final  12/14/2021 4.2 (L) 6.8 - 21.5 pg/mL Final  08/22/2020 6.4 (L) 6.8 - 21.5 pg/mL Final          Other   Major depressive disorder, recurrent episode,  moderate ([facility]) - Primary    -PHQ-9 score of 0. Recommend to continue current medication regimen, no med changes today. See med list. Recommend to continue [facility] therapy.      Attention deficit disorder (ADD) without hyperactivity    -Stable. PDMP reviewed, no aberrancies noted. Provided medication refills for Vyvanse 30 mg and Adderall 10 mg. Recommend to follow-up in 3 months for medication management.      Relevant Medications   amphetamine-dextroamphetamine (ADDERALL) 10 MG tablet   lisdexamfetamine (VYVANSE) 30 MG capsule   GAD (generalized anxiety disorder)    -Stable. Continue current medication regimen. See med list. Continue BH therapy.       Other Visit Diagnoses     Erectile dysfunction, unspecified erectile dysfunction type       Relevant Medications   tadalafil (CIALIS) 10 MG tablet      Recommend to schedule annual physical exam in the near future. Pt deferred flu vaccine, getting over a cold.   Return in about 3 months (around 10/19/2022) for Mood, ADHD, med management; lab visit in 1-3 weeks for CBC.        Lorrene Reid, PA-C  [facility] at [facility] [phone] (phone) [phone] (fax)  [place]

## 2022-07-19 NOTE — Assessment & Plan Note (Signed)
-  Stable. Continue current medication regimen. See med list. Continue BH therapy.

## 2022-07-19 NOTE — Assessment & Plan Note (Signed)
-  PHQ-9 score of 0. Recommend to continue current medication regimen, no med changes today. See med list. Recommend to continue [facility] therapy.

## 2022-07-24 DIAGNOSIS — Z1211 Encounter for screening for malignant neoplasm of colon: Secondary | ICD-10-CM

## 2022-07-26 ENCOUNTER — Other Ambulatory Visit: Payer: BC Managed Care – PPO

## 2022-07-26 DIAGNOSIS — R7989 Other specified abnormal findings of blood chemistry: Secondary | ICD-10-CM

## 2022-07-27 LAB — CBC WITH DIFFERENTIAL/PLATELET
Basophils Absolute: 0.1 10*3/uL (ref 0.0–0.2)
Basos: 1 %
EOS (ABSOLUTE): 0.1 10*3/uL (ref 0.0–0.4)
Eos: 1 %
Hematocrit: 50.6 % (ref 37.5–51.0)
Hemoglobin: 17.1 g/dL (ref 13.0–17.7)
Immature Grans (Abs): 0.1 10*3/uL (ref 0.0–0.1)
Immature Granulocytes: 1 %
Lymphocytes Absolute: 1 10*3/uL (ref 0.7–3.1)
Lymphs: 10 %
MCH: 28.9 pg (ref 26.6–33.0)
MCHC: 33.8 g/dL (ref 31.5–35.7)
MCV: 86 fL (ref 79–97)
Monocytes Absolute: 0.6 10*3/uL (ref 0.1–0.9)
Monocytes: 6 %
Neutrophils Absolute: 7.5 10*3/uL — ABNORMAL HIGH (ref 1.4–7.0)
Neutrophils: 81 %
Platelets: 324 10*3/uL (ref 150–450)
RBC: 5.91 x10E6/uL — ABNORMAL HIGH (ref 4.14–5.80)
RDW: 12.6 % (ref 11.6–15.4)
WBC: 9.3 10*3/uL (ref 3.4–10.8)

## 2022-08-02 ENCOUNTER — Other Ambulatory Visit: Payer: Self-pay | Admitting: Physician Assistant

## 2022-08-02 DIAGNOSIS — E291 Testicular hypofunction: Secondary | ICD-10-CM

## 2022-08-09 DIAGNOSIS — Z131 Encounter for screening for diabetes mellitus: Secondary | ICD-10-CM | POA: Diagnosis not present

## 2022-08-09 DIAGNOSIS — E291 Testicular hypofunction: Secondary | ICD-10-CM | POA: Diagnosis not present

## 2022-08-09 DIAGNOSIS — F32A Depression, unspecified: Secondary | ICD-10-CM | POA: Diagnosis not present

## 2022-08-09 DIAGNOSIS — E611 Iron deficiency: Secondary | ICD-10-CM | POA: Diagnosis not present

## 2022-08-09 DIAGNOSIS — E538 Deficiency of other specified B group vitamins: Secondary | ICD-10-CM | POA: Diagnosis not present

## 2022-08-09 DIAGNOSIS — M2550 Pain in unspecified joint: Secondary | ICD-10-CM | POA: Diagnosis not present

## 2022-08-09 DIAGNOSIS — R5383 Other fatigue: Secondary | ICD-10-CM | POA: Diagnosis not present

## 2022-08-09 DIAGNOSIS — E663 Overweight: Secondary | ICD-10-CM | POA: Diagnosis not present

## 2022-08-14 ENCOUNTER — Encounter: Payer: Self-pay | Admitting: Gastroenterology

## 2022-08-28 ENCOUNTER — Other Ambulatory Visit: Payer: Self-pay | Admitting: Physician Assistant

## 2022-08-28 DIAGNOSIS — F411 Generalized anxiety disorder: Secondary | ICD-10-CM

## 2022-08-28 DIAGNOSIS — F988 Other specified behavioral and emotional disorders with onset usually occurring in childhood and adolescence: Secondary | ICD-10-CM

## 2022-08-28 DIAGNOSIS — F331 Major depressive disorder, recurrent, moderate: Secondary | ICD-10-CM

## 2022-08-29 ENCOUNTER — Ambulatory Visit (AMBULATORY_SURGERY_CENTER): Payer: Self-pay | Admitting: *Deleted

## 2022-08-29 VITALS — Ht 73.0 in | Wt 279.0 lb

## 2022-08-29 DIAGNOSIS — Z1211 Encounter for screening for malignant neoplasm of colon: Secondary | ICD-10-CM

## 2022-08-29 MED ORDER — NA SULFATE-K SULFATE-MG SULF 17.5-3.13-1.6 GM/177ML PO SOLN: 1.0000 | Freq: Once | ORAL | 0 refills | Status: AC

## 2022-08-29 NOTE — Progress Notes (Signed)
No egg or soy allergy known to patient  No issues known to pt with past sedation with any surgeries or procedures No trouble moving neck No FH of Malignant Hyperthermia Pt is not on diet pills Pt is not on  home 02  Pt is not on blood thinners  Pt denies issues with constipation  Pt encouraged to use to use Singlecare or Goodrx to reduce cost  Patient's chart reviewed by Philip Daugherty CNRA prior to previsit and patient appropriate for the [place].  Previsit completed and red dot placed by patient's name on their procedure day (on provider's schedule).    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  No issues since then

## 2022-09-11 DIAGNOSIS — R5383 Other fatigue: Secondary | ICD-10-CM | POA: Diagnosis not present

## 2022-09-11 DIAGNOSIS — M2550 Pain in unspecified joint: Secondary | ICD-10-CM | POA: Diagnosis not present

## 2022-09-11 DIAGNOSIS — E663 Overweight: Secondary | ICD-10-CM | POA: Diagnosis not present

## 2022-09-11 DIAGNOSIS — F32A Depression, unspecified: Secondary | ICD-10-CM | POA: Diagnosis not present

## 2022-09-19 DIAGNOSIS — F332 Major depressive disorder, recurrent severe without psychotic features: Secondary | ICD-10-CM | POA: Diagnosis not present

## 2022-09-24 ENCOUNTER — Encounter: Payer: Self-pay | Admitting: Gastroenterology

## 2022-09-26 ENCOUNTER — Encounter: Payer: Self-pay | Admitting: Certified Registered Nurse Anesthetist

## 2022-09-27 ENCOUNTER — Encounter: Payer: Self-pay | Admitting: Gastroenterology

## 2022-09-27 ENCOUNTER — Ambulatory Visit (AMBULATORY_SURGERY_CENTER): Payer: BC Managed Care – PPO | Admitting: Gastroenterology

## 2022-09-27 VITALS — BP 107/71 | HR 84 | Temp 99.3°F | Resp 16 | Ht 73.0 in | Wt 279.0 lb

## 2022-09-27 DIAGNOSIS — D122 Benign neoplasm of ascending colon: Secondary | ICD-10-CM

## 2022-09-27 DIAGNOSIS — Z1211 Encounter for screening for malignant neoplasm of colon: Secondary | ICD-10-CM | POA: Diagnosis not present

## 2022-09-27 MED ORDER — SODIUM CHLORIDE 0.9 % IV SOLN: 500.0000 mL | Freq: Once | INTRAVENOUS | Status: DC

## 2022-09-27 NOTE — Progress Notes (Signed)
Called to room to assist during endoscopic procedure.  Patient ID and intended procedure confirmed with present staff. Received instructions for my participation in the procedure from the performing physician.  

## 2022-09-27 NOTE — Op Note (Signed)
[facility] Patient Name: Philip Daugherty Procedure Date: 09/27/2022 11:18 AM MRN: [id number] Endoscopist: Mauri Pole , MD, [id number] Age: 46 Referring MD:  Date of Birth: 07/06/1976 Gender: Male Account #: [id number] Procedure:                Colonoscopy Indications:              Screening for colorectal malignant neoplasm Medicines:                Monitored Anesthesia Care Procedure:                Pre-Anesthesia Assessment:                           - Prior to the procedure, a History and Physical                            was performed, and patient medications and                            allergies were reviewed. The patient's tolerance of                            previous anesthesia was also reviewed. The risks                            and benefits of the procedure and the sedation                            options and risks were discussed with the patient.                            All questions were answered, and informed consent                            was obtained. Prior Anticoagulants: The patient has                            taken no anticoagulant or antiplatelet agents. ASA                            Grade Assessment: II - A patient with mild systemic                            disease. After reviewing the risks and benefits,                            the patient was deemed in satisfactory condition to                            undergo the procedure.                           After obtaining informed consent, the colonoscope  was passed under direct vision. Throughout the                            procedure, the patient's blood pressure, pulse, and                            oxygen saturations were monitored continuously. The                            Olympus PCF-H190DL [id number]) Colonoscope was                            introduced through the anus and advanced to the the                            cecum,  identified by appendiceal orifice and                            ileocecal valve. The colonoscopy was performed                            without difficulty. The patient tolerated the                            procedure well. The quality of the bowel                            preparation was poor. The ileocecal valve,                            appendiceal orifice, and rectum were photographed. Scope In: 11:34:40 AM Scope Out: 11:50:12 AM Scope Withdrawal Time: 0 hours 10 minutes 39 seconds  Total Procedure Duration: 0 hours 15 minutes 32 seconds  Findings:                 The perianal and digital rectal examinations were                            normal.                           Two sessile polyps were found in the ascending                            colon. The polyps were 3 to 5 mm in size. These                            polyps were removed with a cold snare. Resection                            and retrieval were complete.                           A moderate amount of semi-liquid semi-solid stool  was found in the descending colon, in the                            transverse colon, in the ascending colon and in the                            cecum, interfering with visualization. Lavage of                            the area was performed, resulting in incomplete                            clearance with continued poor visualization. Complications:            No immediate complications. Estimated Blood Loss:     Estimated blood loss was minimal. Impression:               - Preparation of the colon was poor.                           - Two 3 to 5 mm polyps in the ascending colon,                            removed with a cold snare. Resected and retrieved.                           - Stool in the descending colon, in the transverse                            colon, in the ascending colon and in the cecum. Recommendation:           - Patient has a  contact number available for                            emergencies. The signs and symptoms of potential                            delayed complications were discussed with the                            patient. Return to normal activities tomorrow.                            Written discharge instructions were provided to the                            patient.                           - Resume previous diet.                           - Continue present medications.                           -  Await pathology results.                           - Repeat colonoscopy at the next available                            appointment for surveillance based on pathology                            results. Mauri Pole, MD 09/27/2022 11:57:35 AM This report has been signed electronically.

## 2022-09-27 NOTE — Patient Instructions (Addendum)
RECOMMENDATIONS: - Resume previous diet - Continue present medications. - Await pathology results. - Repeat colonoscopy at the next available appointment for surveillance based on pathology results.   **Handout given on polyps**  YOU HAD AN ENDOSCOPIC PROCEDURE TODAY AT [facility]:   Refer to the procedure report that was given to you for any specific questions about what was found during the examination.  If the procedure report does not answer your questions, please call your gastroenterologist to clarify.  If you requested that your care partner not be given the details of your procedure findings, then the procedure report has been included in a sealed envelope for you to review at your convenience later.  YOU SHOULD EXPECT: Some feelings of bloating in the abdomen. Passage of more gas than usual.  Walking can help get rid of the air that was put into your GI tract during the procedure and reduce the bloating. If you had a lower endoscopy (such as a colonoscopy or flexible sigmoidoscopy) you may notice spotting of blood in your stool or on the toilet paper. If you underwent a bowel prep for your procedure, you may not have a normal bowel movement for a few days.  Please Note:  You might notice some irritation and congestion in your nose or some drainage.  This is from the oxygen used during your procedure.  There is no need for concern and it should clear up in a day or so.  SYMPTOMS TO REPORT IMMEDIATELY:  Following lower endoscopy (colonoscopy or flexible sigmoidoscopy):  Excessive amounts of blood in the stool  Significant tenderness or worsening of abdominal pains  Swelling of the abdomen that is new, acute  Fever of 100F or higher   For urgent or emergent issues, a gastroenterologist can be reached at any hour by calling [phone]. Do not use MyChart messaging for urgent concerns.    DIET:  We do recommend a small meal at first, but then you may proceed to  your regular diet.  Drink plenty of fluids but you should avoid alcoholic beverages for 24 hours.  MEDICATIONS: Continue present medications.  Please see handouts given to you by your recovery nurse: polyps, prep instructions.  FOLLOW UP: Scheduled for a repeat colonoscopy in January with a 2 day Mirilax prep.  Thank you for allowing [place] to provide for your healthcare needs today.  ACTIVITY:  You should plan to take it easy for the rest of today and you should NOT DRIVE or use heavy machinery until tomorrow (because of the sedation medicines used during the test).    FOLLOW UP: Our staff will call the number listed on your records the next business day following your procedure.  We will call around 7:15- 8:00 am to check on you and address any questions or concerns that you may have regarding the information given to you following your procedure. If we do not reach you, we will leave a message.     If any biopsies were taken you will be contacted by phone or by letter within the next 1-3 weeks.  Please call us at [phone] if you have not heard about the biopsies in 3 weeks.    SIGNATURES/CONFIDENTIALITY: You and/or your care partner have signed paperwork which will be entered into your electronic medical record.  These signatures attest to the fact that that the information above on your After Visit Summary has been reviewed and is understood.  Full responsibility of the confidentiality of this  discharge information lies with you and/or your care-partner. 

## 2022-09-27 NOTE — Progress Notes (Signed)
[facility] History and Physical   Primary Care Physician:  Lorrene Reid, PA-C   Reason for Procedure:  Colorectal cancer screening  Plan:    Screening colonoscopy with possible interventions as needed     HPI: Philip Daugherty is a very pleasant 46 y.o. male here for screening colonoscopy. Denies any nausea, vomiting, abdominal pain, melena or bright red blood per rectum  The risks and benefits as well as alternatives of endoscopic procedure(s) have been discussed and reviewed. All questions answered. The patient agrees to proceed.    Past Medical History:  Diagnosis Date   Acute pancreatitis 05/17/2016   Admitted to hosp while out of town in [place].  Likely secondary to ETOH.  GB/stone w/u NEG.   ADD (attention deficit disorder)    Alcohol dependence ([place])    has cut back since 2013   Allergy    Allergy to cats    takes claritin   Asthma    childhood   Bronchitis    Depression    wellbutrin helped in the past; pt cites failure to respond to prozac, zoloft, and celexa   Fear of flying    +insomnia; was on xanax regulary for long period and then abruptly stopped it and had w/drawal seizures.   GERD (gastroesophageal reflux disease)    Headache    migraines   Hypogonadism male 2012 and 2016   Managed by Dr. Loanne Drilling (endo) as of 04/2017, but this was only brief.  I restarted pt on testos IM at end of Oct 2019.   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 Era Bumpers)   Obesity, morbid, BMI 40.0-49.9 ([place])    Palpitations    24h Holter.: sinus tachycardia   Prediabetes 01/2017   A1c 6.3% ; 6.1% Dec 2018.   Seizure ([place])    x1 after coming off a medication one time occurence 2010- no issues since   Sleep apnea    doesn't use CPAP   Suicidal ideation    Syncope due to orthostatic hypotension    d/c'd from [employer] due to this.  Says if Wt 235 lbs or more then this is not a problem    Past Surgical History:   Procedure Laterality Date   EYE SURGERY Bilateral    lasik   FRACTURE SURGERY     broken finger with pins   UPPER GASTROINTESTINAL ENDOSCOPY     VASECTOMY Bilateral 03/05/2022   Procedure: VASECTOMY;  Surgeon: Hollice Espy, MD;  Location: [facility] ORS;  Service: Urology;  Laterality: Bilateral;    Prior to Admission medications   Medication Sig Start Date End Date Taking? Authorizing Provider  amphetamine-dextroamphetamine (ADDERALL) 10 MG tablet TAKE 1 TO 2 TABLETS BY MOUTH IN THE AFTERNOON. 08/30/22  Yes Abonza, Maritza, PA-C  DULoxetine (CYMBALTA) 20 MG capsule Take 20 mg by mouth daily.   Yes [provider]  testosterone cypionate (DEPOTESTOSTERONE CYPIONATE) 200 MG/ML injection INJECT 1.5 MLS INTO THE MUSCLE ONCE A WEEK. 08/02/22  Yes Abonza, Maritza, PA-C  VYVANSE 30 MG capsule TAKE 1 CAPSULE (30 MG TOTAL) BY MOUTH DAILY. 08/30/22  Yes Abonza, Maritza, PA-C  hydrOXYzine (VISTARIL) 25 MG capsule Take 1 capsule (25 mg total) by mouth every 6 (six) hours as needed for anxiety. Patient not taking: Reported on 09/27/2022 11/02/21   Lorrene Reid, PA-C  tadalafil (CIALIS) 10 MG tablet Take 1 tablet (10 mg total) by mouth every other day as needed for erectile dysfunction. 07/19/22  Lorrene Reid, PA-C    Current Outpatient Medications  Medication Sig Dispense Refill   amphetamine-dextroamphetamine (ADDERALL) 10 MG tablet TAKE 1 TO 2 TABLETS BY MOUTH IN THE AFTERNOON. 60 tablet 0   DULoxetine (CYMBALTA) 20 MG capsule Take 20 mg by mouth daily.     testosterone cypionate (DEPOTESTOSTERONE CYPIONATE) 200 MG/ML injection INJECT 1.5 MLS INTO THE MUSCLE ONCE A WEEK. 10 mL 0   VYVANSE 30 MG capsule TAKE 1 CAPSULE (30 MG TOTAL) BY MOUTH DAILY. 30 capsule 0   hydrOXYzine (VISTARIL) 25 MG capsule Take 1 capsule (25 mg total) by mouth every 6 (six) hours as needed for anxiety. (Patient not taking: Reported on 09/27/2022) 60 capsule 0   tadalafil (CIALIS) 10 MG tablet Take 1 tablet (10  mg total) by mouth every other day as needed for erectile dysfunction. 30 tablet 2   Current Facility-Administered Medications  Medication Dose Route Frequency Provider Last Rate Last Admin   0.9 %  sodium chloride infusion  500 mL Intravenous Once Mauri Pole, MD        Allergies as of 09/27/2022   (No Known Allergies)    Family History  Problem Relation Age of Onset   Depression Mother        bipolar disorder   Parkinsonism Mother    Diabetes Father    Other Father        low testosterone   Colon cancer Neg Hx    Esophageal cancer Neg Hx    Stomach cancer Neg Hx    Rectal cancer Neg Hx     Social History   Socioeconomic History   Marital status: Married    Spouse name: Not on file   Number of children: Not on file   Years of education: Not on file   Highest education level: Not on file  Occupational History   Not on file  Tobacco Use   Smoking status: Never   Smokeless tobacco: Never  Vaping Use   Vaping Use: Never used  Substance and Sexual Activity   Alcohol use: Yes    Comment: occ   Drug use: Yes    Types: Other-see comments    Comment: Delta 8 gummies qhs   Sexual activity: Not on file  Other Topics Concern   Not on file  Social History Narrative   Married, second marriage.  No children.   Orig from [place], spent time in [place], college in [place].   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   No tobacco.  Alcohol: 3-4 bourbon drinks most days, cut back as of 12/2011.   Exercise: sporadic (elliptical, walking).  No hx of drug use/abuse.      Social Determinants of Health   Financial Resource Strain: Not on file  Food Insecurity: Not on file  Transportation Needs: Not on file  Physical Activity: Not on file  Stress: Not on file  Social Connections: Not on file  Intimate Partner Violence: Not on file     Review of Systems:  All other review of systems negative except as mentioned in the HPI.  Physical Exam: Vital signs in last 24 hours: Blood Pressure (Abnormal) 143/86   Pulse 86   Temperature 99.3 F (37.4 C) (Skin)   Respiration 11   Height '6\' 1"'$  (1.854 m)   Weight 279 lb (126.6 kg)   Oxygen Saturation 99%   Body  Mass Index 36.81 kg/m  General:   Alert, NAD Lungs:  Clear .   Heart:  Regular rate and rhythm Abdomen:  Soft, nontender and nondistended. Neuro/Psych:  Alert and cooperative. Normal mood and affect. A and O x 3  Reviewed labs, radiology imaging, old records and pertinent past GI work up  Patient is appropriate for planned procedure(s) and anesthesia in an ambulatory setting   K. Denzil Magnuson , MD [phone]

## 2022-09-27 NOTE — Progress Notes (Signed)
Report given to PACU, vss 

## 2022-09-27 NOTE — Progress Notes (Signed)
Pt's states no medical or surgical changes since previsit or office visit. VS assessed by D.T 

## 2022-09-28 ENCOUNTER — Telehealth: Payer: Self-pay | Admitting: *Deleted

## 2022-09-28 NOTE — Telephone Encounter (Signed)
  Follow up Call-     09/27/2022   10:43 AM 09/27/2022   10:37 AM  Call back number  Post procedure Call Back phone  # [phone]  Permission to leave phone message Yes Yes   LMOM

## 2022-10-02 ENCOUNTER — Encounter: Payer: Self-pay | Admitting: Gastroenterology

## 2022-10-04 ENCOUNTER — Encounter: Payer: Self-pay | Admitting: Nurse Practitioner

## 2022-10-04 DIAGNOSIS — F988 Other specified behavioral and emotional disorders with onset usually occurring in childhood and adolescence: Secondary | ICD-10-CM

## 2022-10-04 NOTE — Telephone Encounter (Signed)
LOV: 07/19/22

## 2022-10-05 MED ORDER — LISDEXAMFETAMINE DIMESYLATE 30 MG PO CAPS: 30.0000 mg | ORAL_CAPSULE | Freq: Every day | ORAL | 0 refills | Status: DC

## 2022-10-10 ENCOUNTER — Telehealth: Payer: BC Managed Care – PPO | Admitting: Physician Assistant

## 2022-10-10 DIAGNOSIS — B9689 Other specified bacterial agents as the cause of diseases classified elsewhere: Secondary | ICD-10-CM | POA: Diagnosis not present

## 2022-10-10 DIAGNOSIS — J208 Acute bronchitis due to other specified organisms: Secondary | ICD-10-CM

## 2022-10-10 MED ORDER — PREDNISONE 10 MG (21) PO TBPK: ORAL_TABLET | ORAL | 0 refills | Status: DC

## 2022-10-10 MED ORDER — BENZONATATE 100 MG PO CAPS: 100.0000 mg | ORAL_CAPSULE | Freq: Three times a day (TID) | ORAL | 0 refills | Status: DC | PRN

## 2022-10-10 MED ORDER — AZITHROMYCIN 250 MG PO TABS: ORAL_TABLET | ORAL | 0 refills | Status: AC

## 2022-10-10 NOTE — Progress Notes (Signed)
We are sorry that you are not feeling well.  Here is how we plan to help!  Based on your presentation I believe you most likely have A cough due to bacteria.  When patients have a fever and a productive cough with a change in color or increased sputum production, we are concerned about bacterial bronchitis.  If left untreated it can progress to pneumonia.  If your symptoms do not improve with your treatment plan it is important that you contact your provider.   I have prescribed Azithromyin 250 mg: two tablets now and then one tablet daily for 4 additonal days    In addition you may use A non-prescription cough medication called Mucinex DM: take 2 tablets every 12 hours. and A prescription cough medication called Tessalon Perles '100mg'$ . You may take 1-2 capsules every 8 hours as needed for your cough.  Prednisone 10 mg daily for 6 days (see taper instructions below)  Directions for 6 day taper: Day 1: 2 tablets before breakfast, 1 after both lunch & dinner and 2 at bedtime Day 2: 1 tab before breakfast, 1 after both lunch & dinner and 2 at bedtime Day 3: 1 tab at each meal & 1 at bedtime Day 4: 1 tab at breakfast, 1 at lunch, 1 at bedtime Day 5: 1 tab at breakfast & 1 tab at bedtime Day 6: 1 tab at breakfast  From your responses in the eVisit questionnaire you describe inflammation in the upper respiratory tract which is causing a significant cough.  This is commonly called Bronchitis and has four common causes:   Allergies Viral Infections Acid Reflux Bacterial Infection Allergies, viruses and acid reflux are treated by controlling symptoms or eliminating the cause. An example might be a cough caused by taking certain blood pressure medications. You stop the cough by changing the medication. Another example might be a cough caused by acid reflux. Controlling the reflux helps control the cough.  USE OF BRONCHODILATOR ("RESCUE") INHALERS: There is a risk from using your bronchodilator too  frequently.  The risk is that over-reliance on a medication which only relaxes the muscles surrounding the breathing tubes can reduce the effectiveness of medications prescribed to reduce swelling and congestion of the tubes themselves.  Although you feel brief relief from the bronchodilator inhaler, your asthma may actually be worsening with the tubes becoming more swollen and filled with mucus.  This can delay other crucial treatments, such as oral steroid medications. If you need to use a bronchodilator inhaler daily, several times per day, you should discuss this with your provider.  There are probably better treatments that could be used to keep your asthma under control.     HOME CARE Only take medications as instructed by your medical team. Complete the entire course of an antibiotic. Drink plenty of fluids and get plenty of rest. Avoid close contacts especially the very young and the elderly Cover your mouth if you cough or cough into your sleeve. Always remember to wash your hands A steam or ultrasonic humidifier can help congestion.   GET HELP RIGHT AWAY IF: You develop worsening fever. You become short of breath You cough up blood. Your symptoms persist after you have completed your treatment plan MAKE SURE YOU  Understand these instructions. Will watch your condition. Will get help right away if you are not doing well or get worse.    Thank you for choosing an e-visit.  Your e-visit answers were reviewed by a board certified advanced clinical  practitioner to complete your personal care plan. Depending upon the condition, your plan could have included both over the counter or prescription medications.  Please review your pharmacy choice. Make sure the pharmacy is open so you can pick up prescription now. If there is a problem, you may contact your provider through [employer] and have the prescription routed to another pharmacy.  Your safety is important to [place]. If you have  drug allergies check your prescription carefully.   For the next 24 hours you can use MyChart to ask questions about today's visit, request a non-urgent call back, or ask for a work or school excuse. You will get an email in the next two days asking about your experience. I hope that your e-visit has been valuable and will speed your recovery.  I have spent 5 minutes in review of e-visit questionnaire, review and updating patient chart, medical decision making and response to patient.   Mar Daring, PA-C

## 2022-10-10 NOTE — Progress Notes (Signed)
This encounter was created in error - please disregard.

## 2022-10-17 DIAGNOSIS — F332 Major depressive disorder, recurrent severe without psychotic features: Secondary | ICD-10-CM | POA: Diagnosis not present

## 2022-10-18 ENCOUNTER — Ambulatory Visit (AMBULATORY_SURGERY_CENTER): Payer: BC Managed Care – PPO | Admitting: Gastroenterology

## 2022-10-18 ENCOUNTER — Encounter: Payer: Self-pay | Admitting: Gastroenterology

## 2022-10-18 ENCOUNTER — Encounter: Payer: BC Managed Care – PPO | Admitting: Gastroenterology

## 2022-10-18 VITALS — BP 103/79 | HR 82 | Temp 100.0°F | Resp 11 | Ht 73.0 in | Wt 279.0 lb

## 2022-10-18 DIAGNOSIS — D122 Benign neoplasm of ascending colon: Secondary | ICD-10-CM

## 2022-10-18 DIAGNOSIS — D125 Benign neoplasm of sigmoid colon: Secondary | ICD-10-CM | POA: Diagnosis not present

## 2022-10-18 DIAGNOSIS — D123 Benign neoplasm of transverse colon: Secondary | ICD-10-CM | POA: Diagnosis not present

## 2022-10-18 DIAGNOSIS — Z1211 Encounter for screening for malignant neoplasm of colon: Secondary | ICD-10-CM | POA: Diagnosis not present

## 2022-10-18 MED ORDER — SODIUM CHLORIDE 0.9 % IV SOLN: 500.0000 mL | Freq: Once | INTRAVENOUS | Status: DC

## 2022-10-18 NOTE — Progress Notes (Signed)
Called to room to assist during endoscopic procedure.  Patient ID and intended procedure confirmed with present staff. Received instructions for my participation in the procedure from the performing physician.  

## 2022-10-18 NOTE — Progress Notes (Signed)
[facility] History and Physical   Primary Care Physician:  Lorrene Reid, PA-C   Reason for Procedure:  History of adenomatous colon polyps, inadequate bowel prep on last exam  Plan:    Surveillance colonoscopy with possible interventions as needed     HPI: Philip Daugherty is a very pleasant 47 y.o. male here for surveillance colonoscopy. Denies any nausea, vomiting, abdominal pain, melena or bright red blood per rectum  The risks and benefits as well as alternatives of endoscopic procedure(s) have been discussed and reviewed. All questions answered. The patient agrees to proceed.    Past Medical History:  Diagnosis Date   Acute pancreatitis 05/17/2016   Admitted to hosp while out of town in [place].  Likely secondary to ETOH.  GB/stone w/u NEG.   ADD (attention deficit disorder)    Alcohol dependence ([place])    has cut back since 2013   Allergy    Allergy to cats    takes claritin   Asthma    childhood   Bronchitis    Depression    wellbutrin helped in the past; pt cites failure to respond to prozac, zoloft, and celexa   Fear of flying    +insomnia; was on xanax regulary for long period and then abruptly stopped it and had w/drawal seizures.   GERD (gastroesophageal reflux disease)    Headache    migraines   Hypogonadism male 2012 and 2016   Managed by Dr. Loanne Drilling (endo) as of 04/2017, but this was only brief.  I restarted pt on testos IM at end of Oct 2019.   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 Era Bumpers)   Obesity, morbid, BMI 40.0-49.9 ([place])    Palpitations    24h Holter.: sinus tachycardia   Prediabetes 01/2017   A1c 6.3% ; 6.1% Dec 2018.   Seizure ([place])    x1 after coming off a medication one time occurence 2010- no issues since   Sleep apnea    doesn't use CPAP   Suicidal ideation    Syncope due to orthostatic hypotension    d/c'd from [employer] due to this.  Says if Wt 235 lbs or more then  this is not a problem    Past Surgical History:  Procedure Laterality Date   EYE SURGERY Bilateral    lasik   FRACTURE SURGERY     broken finger with pins   UPPER GASTROINTESTINAL ENDOSCOPY     VASECTOMY Bilateral 03/05/2022   Procedure: VASECTOMY;  Surgeon: Hollice Espy, MD;  Location: [facility] ORS;  Service: Urology;  Laterality: Bilateral;    Prior to Admission medications   Medication Sig Start Date End Date Taking? Authorizing Provider  amphetamine-dextroamphetamine (ADDERALL) 10 MG tablet TAKE 1 TO 2 TABLETS BY MOUTH IN THE AFTERNOON. 08/30/22   Lorrene Reid, PA-C  benzonatate (TESSALON) 100 MG capsule Take 1 capsule (100 mg total) by mouth 3 (three) times daily as needed. 10/10/22   Mar Daring, PA-C  DULoxetine (CYMBALTA) 20 MG capsule Take 20 mg by mouth daily.    [provider]  hydrOXYzine (VISTARIL) 25 MG capsule Take 1 capsule (25 mg total) by mouth every 6 (six) hours as needed for anxiety. Patient not taking: Reported on 09/27/2022 11/02/21   Lorrene Reid, PA-C  lisdexamfetamine (VYVANSE) 30 MG capsule Take 1 capsule (30 mg total) by mouth daily. 10/05/22   Ronnell Freshwater, NP  predniSONE (STERAPRED UNI-PAK 21 TAB) 10 MG (21) TBPK tablet  6 day taper; take as directed on package instructions 10/10/22   Mar Daring, PA-C  tadalafil (CIALIS) 10 MG tablet Take 1 tablet (10 mg total) by mouth every other day as needed for erectile dysfunction. 07/19/22   Lorrene Reid, PA-C  testosterone cypionate (DEPOTESTOSTERONE CYPIONATE) 200 MG/ML injection INJECT 1.5 MLS INTO THE MUSCLE ONCE A WEEK. 08/02/22   Lorrene Reid, PA-C    Current Outpatient Medications  Medication Sig Dispense Refill   amphetamine-dextroamphetamine (ADDERALL) 10 MG tablet TAKE 1 TO 2 TABLETS BY MOUTH IN THE AFTERNOON. 60 tablet 0   benzonatate (TESSALON) 100 MG capsule Take 1 capsule (100 mg total) by mouth 3 (three) times daily as needed. 30 capsule 0   DULoxetine  (CYMBALTA) 20 MG capsule Take 20 mg by mouth daily.     hydrOXYzine (VISTARIL) 25 MG capsule Take 1 capsule (25 mg total) by mouth every 6 (six) hours as needed for anxiety. (Patient not taking: Reported on 09/27/2022) 60 capsule 0   lisdexamfetamine (VYVANSE) 30 MG capsule Take 1 capsule (30 mg total) by mouth daily. 30 capsule 0   predniSONE (STERAPRED UNI-PAK 21 TAB) 10 MG (21) TBPK tablet 6 day taper; take as directed on package instructions 21 tablet 0   tadalafil (CIALIS) 10 MG tablet Take 1 tablet (10 mg total) by mouth every other day as needed for erectile dysfunction. 30 tablet 2   testosterone cypionate (DEPOTESTOSTERONE CYPIONATE) 200 MG/ML injection INJECT 1.5 MLS INTO THE MUSCLE ONCE A WEEK. 10 mL 0   Current Facility-Administered Medications  Medication Dose Route Frequency Provider Last Rate Last Admin   0.9 %  sodium chloride infusion  500 mL Intravenous Once Mauri Pole, MD        Allergies as of 10/18/2022   (No Known Allergies)    Family History  Problem Relation Age of Onset   Depression Mother        bipolar disorder   Parkinsonism Mother    Diabetes Father    Other Father        low testosterone   Colon cancer Neg Hx    Esophageal cancer Neg Hx    Stomach cancer Neg Hx    Rectal cancer Neg Hx     Social History   Socioeconomic History   Marital status: Married    Spouse name: Not on file   Number of children: Not on file   Years of education: Not on file   Highest education level: Not on file  Occupational History   Not on file  Tobacco Use   Smoking status: Never   Smokeless tobacco: Never  Vaping Use   Vaping Use: Never used  Substance and Sexual Activity   Alcohol use: Yes    Comment: occ   Drug use: Yes    Types: Other-see comments    Comment: Delta 8 gummies qhs   Sexual activity: Not on file  Other Topics Concern   Not on file  Social History Narrative   Married, second marriage.  No children.   Orig from [place], spent time in  [place], college in [place].   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   No tobacco.  Alcohol: 3-4 bourbon drinks most days, cut back as of 12/2011.   Exercise: sporadic (elliptical, walking).  No hx of drug use/abuse.      Social Determinants of Health  Financial Resource Strain: Not on file  Food Insecurity: Not on file  Transportation Needs: Not on file  Physical Activity: Not on file  Stress: Not on file  Social Connections: Not on file  Intimate Partner Violence: Not on file    Review of Systems:  All other review of systems negative except as mentioned in the HPI.  Physical Exam: Vital signs in last 24 hours: There were no vitals taken for this visit. General:   Alert, NAD Lungs:  Clear .   Heart:  Regular rate and rhythm Abdomen:  Soft, nontender and nondistended. Neuro/Psych:  Alert and cooperative. Normal mood and affect. A and O x 3  Reviewed labs, radiology imaging, old records and pertinent past GI work up  Patient is appropriate for planned procedure(s) and anesthesia in an ambulatory setting   K. Denzil Magnuson , MD [phone]

## 2022-10-18 NOTE — Progress Notes (Signed)
Report to PACU, RN, vss, BBS= Clear.  

## 2022-10-18 NOTE — Op Note (Signed)
[facility] Patient Name: Philip Daugherty Procedure Date: 10/18/2022 10:26 AM MRN: [id number] Endoscopist: Mauri Pole , MD, [id number] Age: 47 Referring MD:  Date of Birth: 1975-11-08 Gender: Male Account #: [id number] Procedure:                Colonoscopy Indications:              High risk colon cancer surveillance: Personal                            history of adenoma less than 10 mm in size.                            Inadequate bowel prep on last exam Medicines:                Monitored Anesthesia Care Procedure:                Pre-Anesthesia Assessment:                           - Prior to the procedure, a History and Physical                            was performed, and patient medications and                            allergies were reviewed. The patient's tolerance of                            previous anesthesia was also reviewed. The risks                            and benefits of the procedure and the sedation                            options and risks were discussed with the patient.                            All questions were answered, and informed consent                            was obtained. Prior Anticoagulants: The patient has                            taken no anticoagulant or antiplatelet agents. ASA                            Grade Assessment: II - A patient with mild systemic                            disease. After reviewing the risks and benefits,                            the patient was deemed in satisfactory condition to  undergo the procedure.                           After obtaining informed consent, the colonoscope                            was passed under direct vision. Throughout the                            procedure, the patient's blood pressure, pulse, and                            oxygen saturations were monitored continuously. The                            Olympus PCF-[id number]  (#[id number]) Colonoscope was                            introduced through the anus and advanced to the the                            cecum, identified by appendiceal orifice and                            ileocecal valve. The colonoscopy was performed                            without difficulty. The patient tolerated the                            procedure well. The quality of the bowel                            preparation was adequate. The ileocecal valve,                            appendiceal orifice, and rectum were photographed. Scope In: 10:31:10 AM Scope Out: 10:50:28 AM Scope Withdrawal Time: 0 hours 15 minutes 51 seconds  Total Procedure Duration: 0 hours 19 minutes 18 seconds  Findings:                 The perianal and digital rectal examinations were                            normal.                           Four sessile polyps were found in the sigmoid colon                            and transverse colon. The polyps were 5 to 7 mm in                            size. These polyps were removed with a cold snare.  Resection and retrieval were complete.                           Non-bleeding external and internal hemorrhoids were                            found during retroflexion. The hemorrhoids were                            medium-sized.                           The exam was otherwise without abnormality. Complications:            No immediate complications. Estimated Blood Loss:     Estimated blood loss was minimal. Impression:               - Four 5 to 7 mm polyps in the sigmoid colon and in                            the transverse colon, removed with a cold snare.                            Resected and retrieved.                           - Non-bleeding external and internal hemorrhoids.                           - The examination was otherwise normal. Recommendation:           - Patient has a contact number available for                             emergencies. The signs and symptoms of potential                            delayed complications were discussed with the                            patient. Return to normal activities tomorrow.                            Written discharge instructions were provided to the                            patient.                           - Resume previous diet.                           - Continue present medications.                           - Await pathology results.                           -  Repeat colonoscopy in 3 years for surveillance                            based on pathology results. Mauri Pole, MD 10/18/2022 10:55:13 AM This report has been signed electronically.

## 2022-10-18 NOTE — Progress Notes (Signed)
Pt's states no medical or surgical changes since previsit or office visit. 

## 2022-10-18 NOTE — Patient Instructions (Signed)
Handouts provided on polyps and hemorrhoids.   Resume previous diet. Continue present medications.  Await pathology results.  Repeat colonoscopy in 3 years for surveillance based on pathology results.   YOU HAD AN ENDOSCOPIC PROCEDURE TODAY AT [facility]:   Refer to the procedure report that was given to you for any specific questions about what was found during the examination.  If the procedure report does not answer your questions, please call your gastroenterologist to clarify.  If you requested that your care partner not be given the details of your procedure findings, then the procedure report has been included in a sealed envelope for you to review at your convenience later.  YOU SHOULD EXPECT: Some feelings of bloating in the abdomen. Passage of more gas than usual.  Walking can help get rid of the air that was put into your GI tract during the procedure and reduce the bloating. If you had a lower endoscopy (such as a colonoscopy or flexible sigmoidoscopy) you may notice spotting of blood in your stool or on the toilet paper. If you underwent a bowel prep for your procedure, you may not have a normal bowel movement for a few days.  Please Note:  You might notice some irritation and congestion in your nose or some drainage.  This is from the oxygen used during your procedure.  There is no need for concern and it should clear up in a day or so.  SYMPTOMS TO REPORT IMMEDIATELY:  Following lower endoscopy (colonoscopy or flexible sigmoidoscopy):  Excessive amounts of blood in the stool  Significant tenderness or worsening of abdominal pains  Swelling of the abdomen that is new, acute  Fever of 100F or higher  For urgent or emergent issues, a gastroenterologist can be reached at any hour by calling [phone]. Do not use MyChart messaging for urgent concerns.    DIET:  We do recommend a small meal at first, but then you may proceed to your regular diet.  Drink plenty  of fluids but you should avoid alcoholic beverages for 24 hours.  ACTIVITY:  You should plan to take it easy for the rest of today and you should NOT DRIVE or use heavy machinery until tomorrow (because of the sedation medicines used during the test).    FOLLOW UP: Our staff will call the number listed on your records the next business day following your procedure.  We will call around 7:15- 8:00 am to check on you and address any questions or concerns that you may have regarding the information given to you following your procedure. If we do not reach you, we will leave a message.     If any biopsies were taken you will be contacted by phone or by letter within the next 1-3 weeks.  Please call us at [phone] if you have not heard about the biopsies in 3 weeks.    SIGNATURES/CONFIDENTIALITY: You and/or your care partner have signed paperwork which will be entered into your electronic medical record.  These signatures attest to the fact that that the information above on your After Visit Summary has been reviewed and is understood.  Full responsibility of the confidentiality of this discharge information lies with you and/or your care-partner.

## 2022-10-19 ENCOUNTER — Telehealth: Payer: Self-pay

## 2022-10-19 NOTE — Telephone Encounter (Signed)
  Follow up Call-     10/18/2022   10:19 AM 09/27/2022   10:43 AM 09/27/2022   10:37 AM  Call back number  Post procedure Call Back phone  # [phone]  Permission to leave phone message Yes Yes Yes     Patient questions:  Do you have a fever, pain , or abdominal swelling? No. Pain Score  0 *  Have you tolerated food without any problems? Yes.    Have you been able to return to your normal activities? Yes.    Do you have any questions about your discharge instructions: Diet   No. Medications  No. Follow up visit  No.  Do you have questions or concerns about your Care? No.  Actions: * If pain score is 4 or above: No action needed, pain <4.

## 2022-10-21 ENCOUNTER — Encounter: Payer: Self-pay | Admitting: Gastroenterology

## 2022-10-25 ENCOUNTER — Encounter: Payer: Self-pay | Admitting: Gastroenterology

## 2022-10-29 ENCOUNTER — Other Ambulatory Visit: Payer: Self-pay | Admitting: Nurse Practitioner

## 2022-10-29 DIAGNOSIS — F988 Other specified behavioral and emotional disorders with onset usually occurring in childhood and adolescence: Secondary | ICD-10-CM

## 2022-10-29 MED ORDER — AMPHETAMINE-DEXTROAMPHET ER 30 MG PO CP24: 30.0000 mg | ORAL_CAPSULE | Freq: Every morning | ORAL | 0 refills | Status: DC

## 2022-12-02 ENCOUNTER — Other Ambulatory Visit: Payer: Self-pay | Admitting: Nurse Practitioner

## 2022-12-02 DIAGNOSIS — F988 Other specified behavioral and emotional disorders with onset usually occurring in childhood and adolescence: Secondary | ICD-10-CM

## 2022-12-03 MED ORDER — AMPHETAMINE-DEXTROAMPHET ER 30 MG PO CP24: 30.0000 mg | ORAL_CAPSULE | Freq: Every morning | ORAL | 0 refills | Status: DC

## 2022-12-22 ENCOUNTER — Encounter: Payer: Self-pay | Admitting: Nurse Practitioner

## 2022-12-26 ENCOUNTER — Other Ambulatory Visit: Payer: Self-pay | Admitting: Nurse Practitioner

## 2022-12-26 DIAGNOSIS — F988 Other specified behavioral and emotional disorders with onset usually occurring in childhood and adolescence: Secondary | ICD-10-CM

## 2022-12-26 DIAGNOSIS — N529 Male erectile dysfunction, unspecified: Secondary | ICD-10-CM

## 2022-12-26 MED ORDER — AMPHETAMINE-DEXTROAMPHETAMINE 10 MG PO TABS: ORAL_TABLET | ORAL | 0 refills | Status: DC

## 2022-12-26 MED ORDER — TADALAFIL 10 MG PO TABS: 10.0000 mg | ORAL_TABLET | ORAL | 2 refills | Status: DC | PRN

## 2023-01-14 ENCOUNTER — Encounter: Payer: Self-pay | Admitting: Physician Assistant

## 2023-01-31 ENCOUNTER — Ambulatory Visit: Payer: BC Managed Care – PPO | Admitting: Family Medicine

## 2023-01-31 ENCOUNTER — Encounter: Payer: Self-pay | Admitting: Family Medicine

## 2023-01-31 VITALS — BP 128/82 | HR 96 | Temp 98.8°F | Ht 73.0 in | Wt 279.0 lb

## 2023-01-31 DIAGNOSIS — F331 Major depressive disorder, recurrent, moderate: Secondary | ICD-10-CM

## 2023-01-31 DIAGNOSIS — F988 Other specified behavioral and emotional disorders with onset usually occurring in childhood and adolescence: Secondary | ICD-10-CM

## 2023-01-31 DIAGNOSIS — N529 Male erectile dysfunction, unspecified: Secondary | ICD-10-CM

## 2023-01-31 MED ORDER — AMPHETAMINE-DEXTROAMPHET ER 20 MG PO CP24: 40.0000 mg | ORAL_CAPSULE | Freq: Every morning | ORAL | 0 refills | Status: DC

## 2023-01-31 MED ORDER — AMPHETAMINE-DEXTROAMPHETAMINE 10 MG PO TABS: ORAL_TABLET | ORAL | 0 refills | Status: DC

## 2023-01-31 MED ORDER — DULOXETINE HCL 40 MG PO CPEP: 40.0000 mg | ORAL_CAPSULE | Freq: Every day | ORAL | 1 refills | Status: DC

## 2023-01-31 NOTE — Patient Instructions (Signed)
I have increased the dose of your duloxtine and your long acting adderall.  Please take as prescribed.  Please follow up in 4-6 weeks to discuss these medication changes.

## 2023-01-31 NOTE — Progress Notes (Signed)
   Established Patient Office Visit  Subjective   Patient ID: Philip Daugherty, male    DOB: 01-23-1976  Age: 47 y.o. MRN: [id number]  Chief Complaint  Patient presents with   Medication Management    Patient gets his Adderall, Cymbalta prescribed with [place].  He is getting his testosterone replacement therapy prescribed at a wellness clinic in [place] called [facility].  ADHD-patient feels like his Adderall is wearing off in effectiveness.  He typically switches between Adderall and Vyvanse when they start to wear off effectiveness.  He takes the evening dose down most nights.  Patient has been taking duloxetine 20 mg for approximately the last 6 months.  Does not feel like it is as effective as he would like.  Was previously on Wellbutrin and another medication he cannot remember but thinks it may be paroxetine.  He has never increased the dose since he started 6 months ago.  He is open to increasing the dose prior to trying a different medication.  No side effects from these medications.  Testosterone-goes to another clinic for this.  Says he gets lab work regularly but does not know exact lab work that he gets.      ROS    Objective:     BP 128/82   Pulse 96   Temp 98.8 F (37.1 C) (Oral)   Ht  (1.854 m)   Wt 279 lb 0.1 oz (126.6 kg)   SpO2 93%   BMI 36.81 kg/m    Physical Exam General: Alert and oriented CV: Regular rate and rhythm Pulmonary: Lungs clear bilaterally   No results found for any visits on 01/31/23.    The 10-year ASCVD risk score (Arnett DK, et al., 2019) is: 2.4%    Assessment & Plan:   Problem List Items Addressed This Visit       Other   Attention deficit disorder (ADD) without hyperactivity    Patient switches between Vyvanse and Adderall in the past when he feels like 1 is becoming less effective. Discussed increasing his Adderall dosage prior to switching to Vyvanse. - Increase Adderall dose to 40 every  morning - Follow-up in 6 weeks      Relevant Medications   amphetamine-dextroamphetamine (ADDERALL) 10 MG tablet   Major depressive disorder, recurrent episode, moderate (HCC)    Patient does not feel like it is working as well recently.  Has not gone up and dose since he initially started taking it.  Will increase his dose of duloxetine to 40 mg.  Follow-up in 5-6 weeks    01/31/2023   10:04 AM 07/19/2022    2:13 PM 03/19/2022    8:51 AM  PHQ9 SCORE ONLY  PHQ-9 Total Score 4 0 4        Relevant Medications   DULoxetine 40 MG CPEP   Other Visit Diagnoses     Attention deficit disorder (ADD) in adult    -  Primary   Relevant Medications   amphetamine-dextroamphetamine (ADDERALL XR) 20 MG 24 hr capsule       Return in about 4 weeks (around 02/28/2023) for add, mood.    Sandre Kitty, MD

## 2023-01-31 NOTE — Assessment & Plan Note (Signed)
Patient does not feel like it is working as well recently.  Has not gone up and dose since he initially started taking it.  Will increase his dose of duloxetine to 40 mg.  Follow-up in 5-6 weeks    01/31/2023   10:04 AM 07/19/2022    2:13 PM 03/19/2022    8:51 AM  PHQ9 SCORE ONLY  PHQ-9 Total Score 4 0 4

## 2023-01-31 NOTE — Assessment & Plan Note (Signed)
Patient switches between Vyvanse and Adderall in the past when he feels like 1 is becoming less effective. Discussed increasing his Adderall dosage prior to switching to Vyvanse. - Increase Adderall dose to 40 every morning - Follow-up in 6 weeks

## 2023-02-05 MED ORDER — TADALAFIL 10 MG PO TABS: 10.0000 mg | ORAL_TABLET | ORAL | 2 refills | Status: DC | PRN

## 2023-03-06 ENCOUNTER — Other Ambulatory Visit: Payer: Self-pay | Admitting: Family Medicine

## 2023-03-06 DIAGNOSIS — F988 Other specified behavioral and emotional disorders with onset usually occurring in childhood and adolescence: Secondary | ICD-10-CM

## 2023-03-14 ENCOUNTER — Ambulatory Visit: Payer: BC Managed Care – PPO | Admitting: Family Medicine

## 2023-03-14 IMAGING — CT CT ANGIO CHEST
2 of 6 series · 18 of 46 positions shown · IV contrast (omnipaque)
Comparison: Chest radiograph from earlier today.

CLINICAL DATA: Altered mental status. Hypoxia. Worsening dyspnea
and cough. COVID positive.

EXAM:
CT ANGIOGRAPHY CHEST WITH CONTRAST
TECHNIQUE: Multidetector CT imaging of the chest was performed using the
standard protocol during bolus administration of intravenous
contrast. Multiplanar CT image reconstructions and MIPs were
obtained to evaluate the vascular anatomy.
CONTRAST:  100mL OMNIPAQUE IOHEXOL 350 MG/ML SOLN

[Series 6: thins · axial · 0.80mm/px · z∈[-801,-535]mm · 15 of 292 slices shown]
[im 13/292  lung]
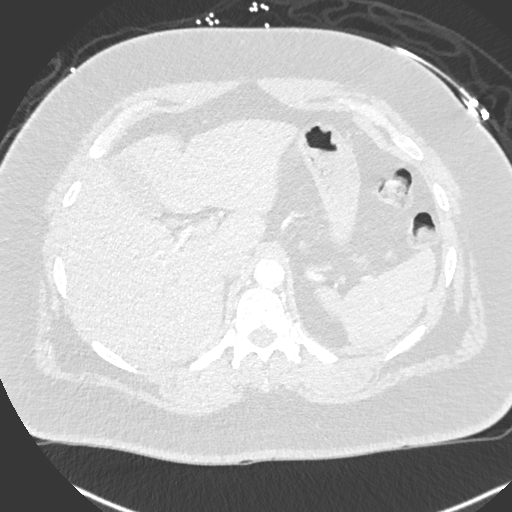
[im 38/292  soft-tissue]
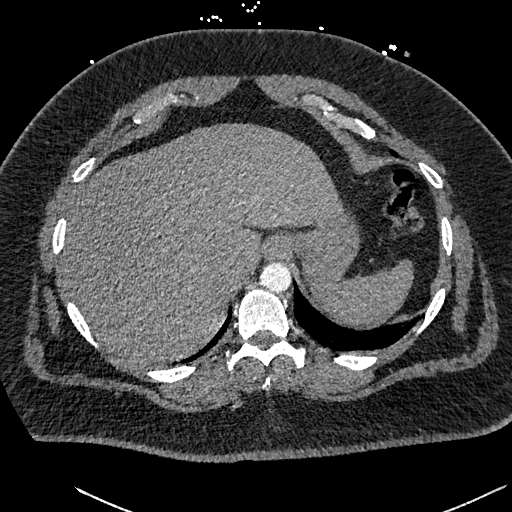
[im 51/292  lung]
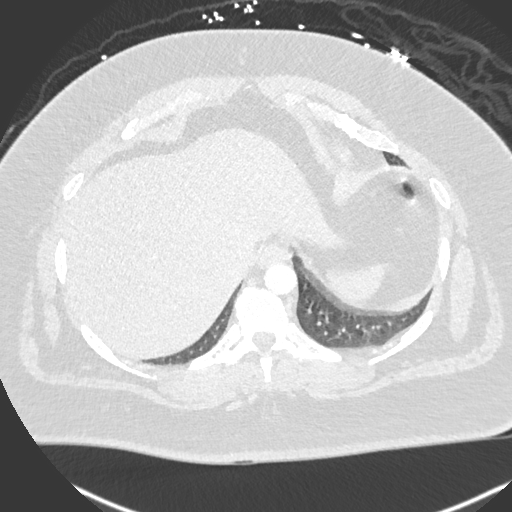
[im 76/292  soft-tissue]
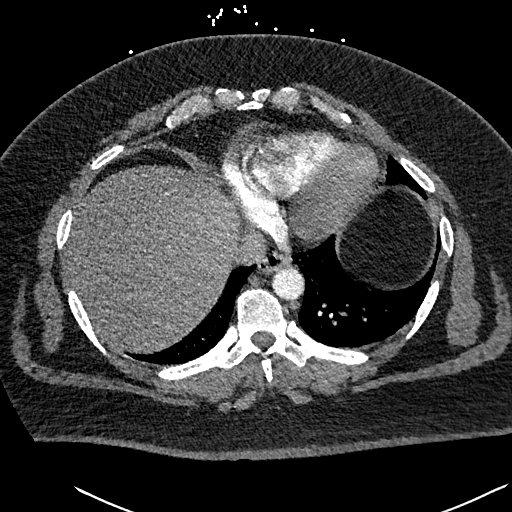
[im 89/292  lung]
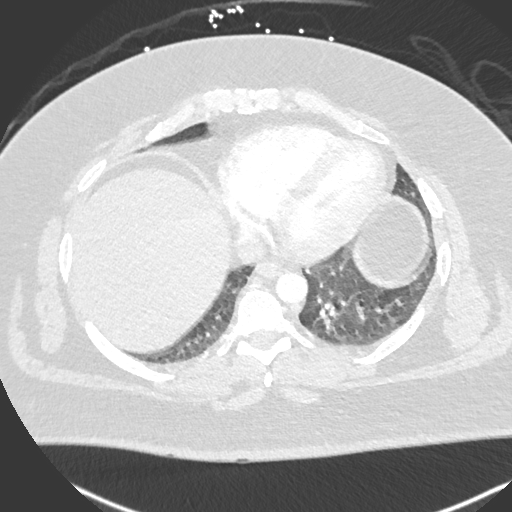
[im 114/292  soft-tissue]
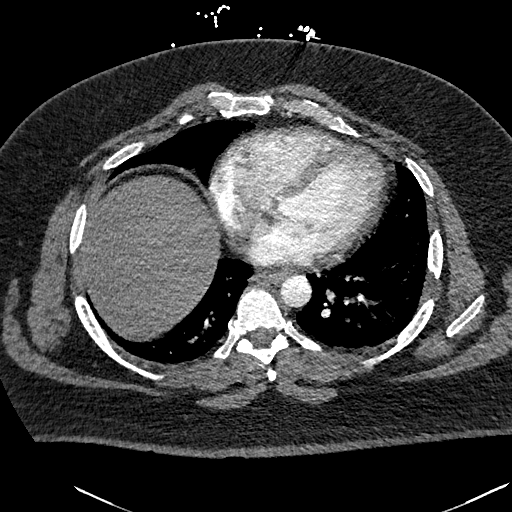
[im 127/292  lung]
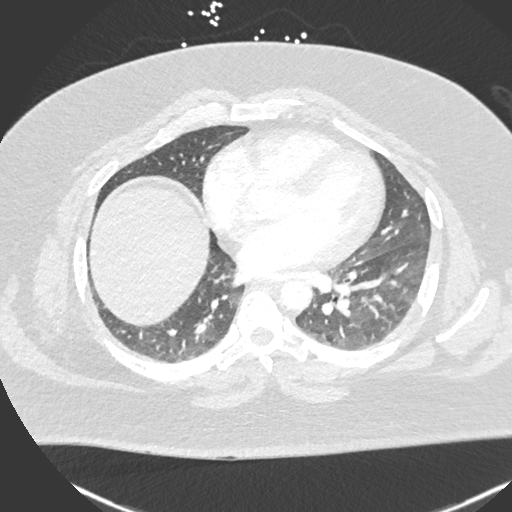
[im 152/292  soft-tissue]
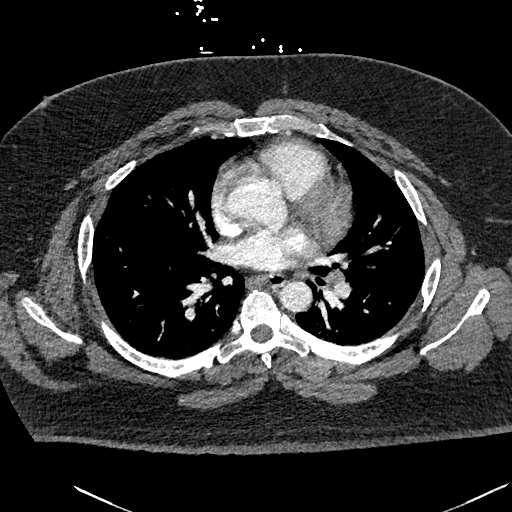
[im 165/292  lung]
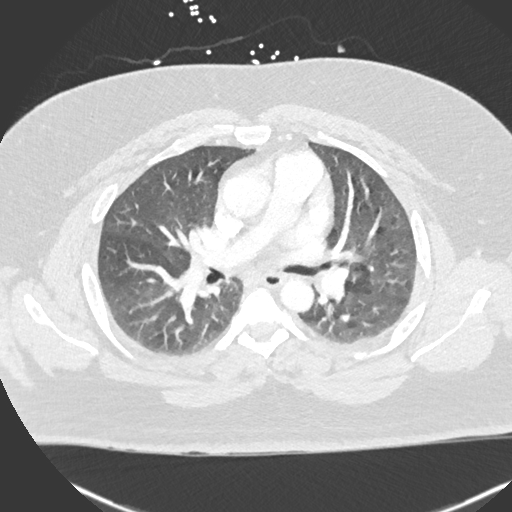
[im 178/292  soft-tissue]
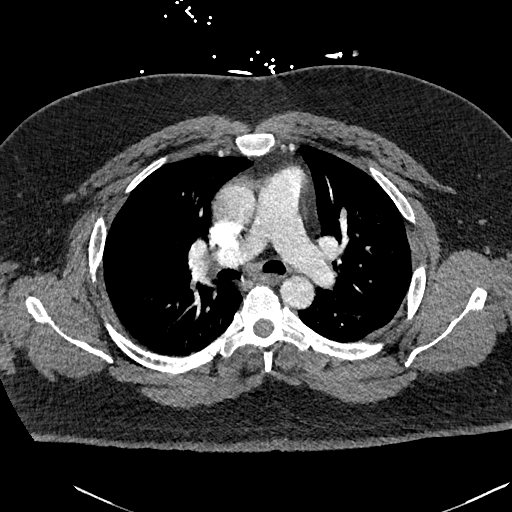
[im 203/292  lung]
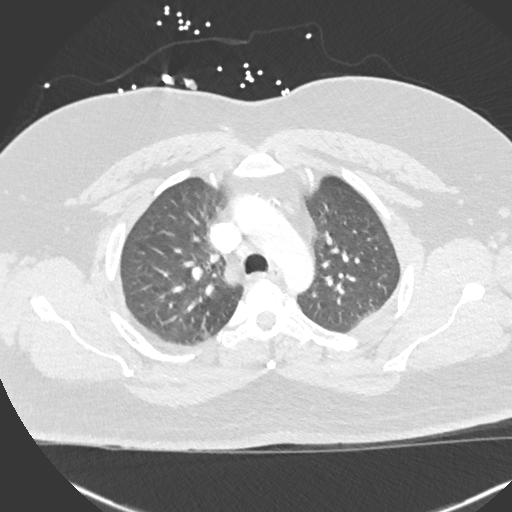
[im 216/292  soft-tissue]
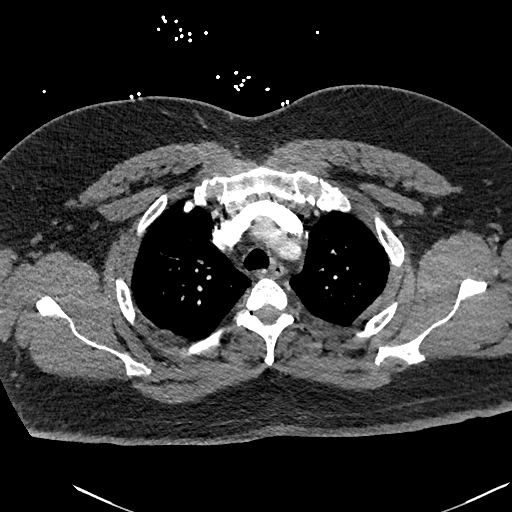
[im 241/292  lung]
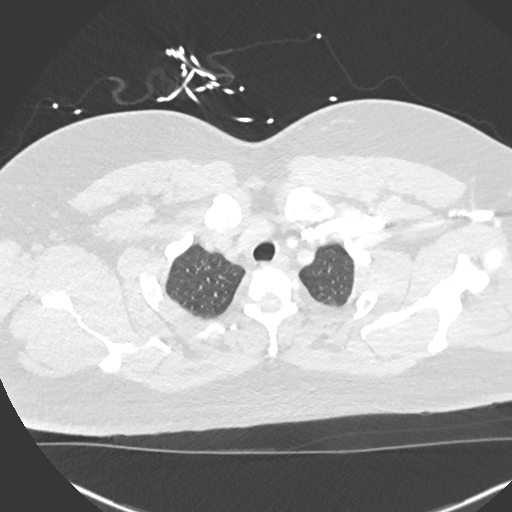
[im 254/292  soft-tissue]
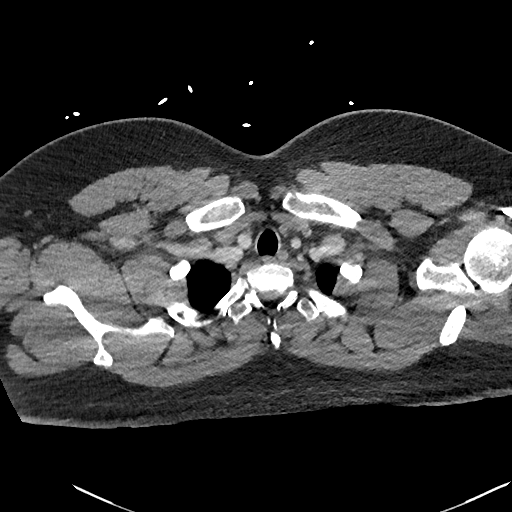
[im 279/292  lung]
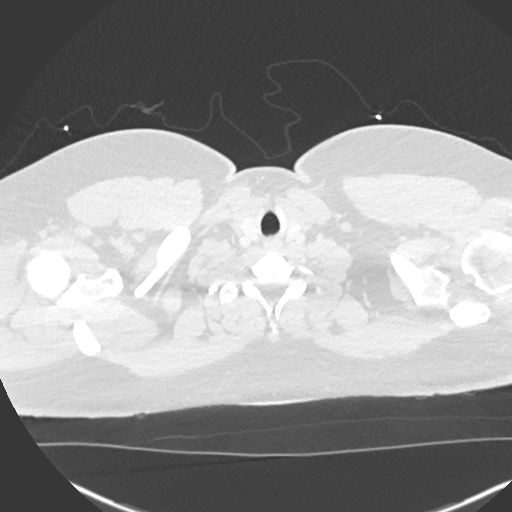

[Series 8: coronal mpr · coronal · 0.62mm/px · 3 of 129 slices shown]
[im 33/129  soft-tissue]
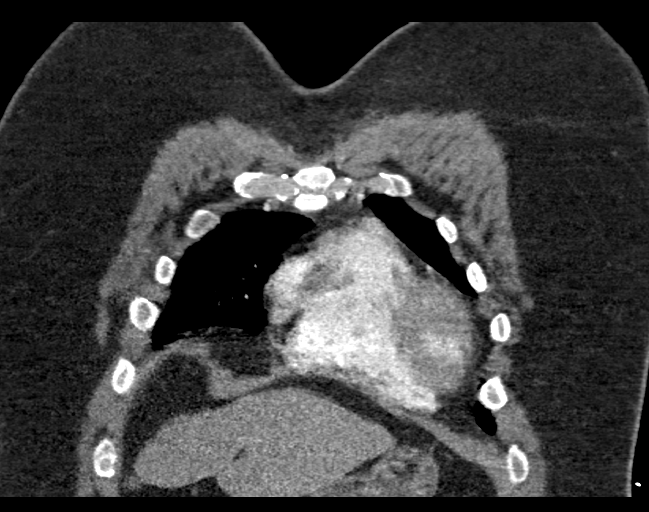
[im 65/129  soft-tissue]
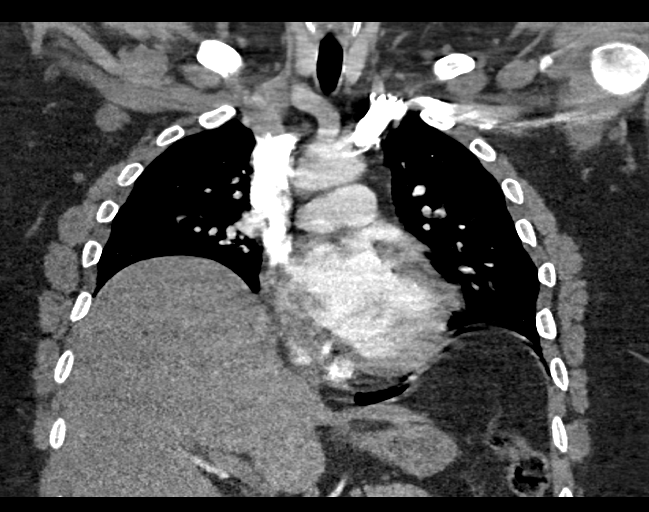
[im 97/129  soft-tissue]
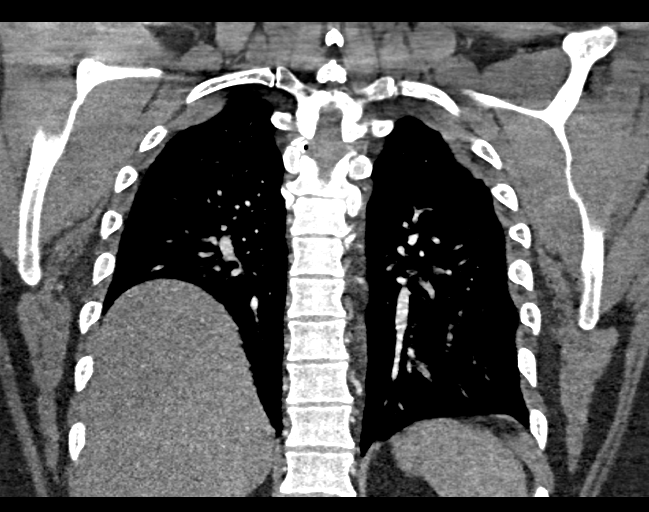

[18 of 46 positions shown; findings below may reference images not displayed]

FINDINGS: Cardiovascular: The study is low to moderate quality for the
evaluation of pulmonary embolism, degraded by motion and suboptimal
bolus opacification. There are no convincing filling defects in the
central, lobar, segmental or subsegmental pulmonary artery branches
to suggest acute pulmonary embolism. Normal course and caliber of
the thoracic aorta. Top-normal caliber main pulmonary artery (3.2 cm
diameter). Normal heart size. No significant pericardial
fluid/thickening.

Mediastinum/Nodes: No discrete thyroid nodules. Unremarkable
esophagus. No pathologically enlarged axillary, mediastinal or hilar
lymph nodes.

Lungs/Pleura: No pneumothorax. No pleural effusion. No acute
consolidative airspace disease, lung masses or significant pulmonary
nodules.

Upper abdomen: No acute abnormality

Musculoskeletal: No aggressive appearing focal osseous lesions. Mild
thoracic spondylosis.

Review of the MIP images confirms the above findings.
IMPRESSION: Limited motion degraded scan. No evidence of pulmonary embolism. No
active pulmonary disease.

## 2023-03-14 IMAGING — DX DG CHEST 1V PORT
1 series · 1 of 1 positions shown · non-contrast
Comparison: None.

CLINICAL DATA: Shortness of breath.  HC5Z3-HH positive patient.

EXAM:
PORTABLE CHEST 1 VIEW

[chest ap]
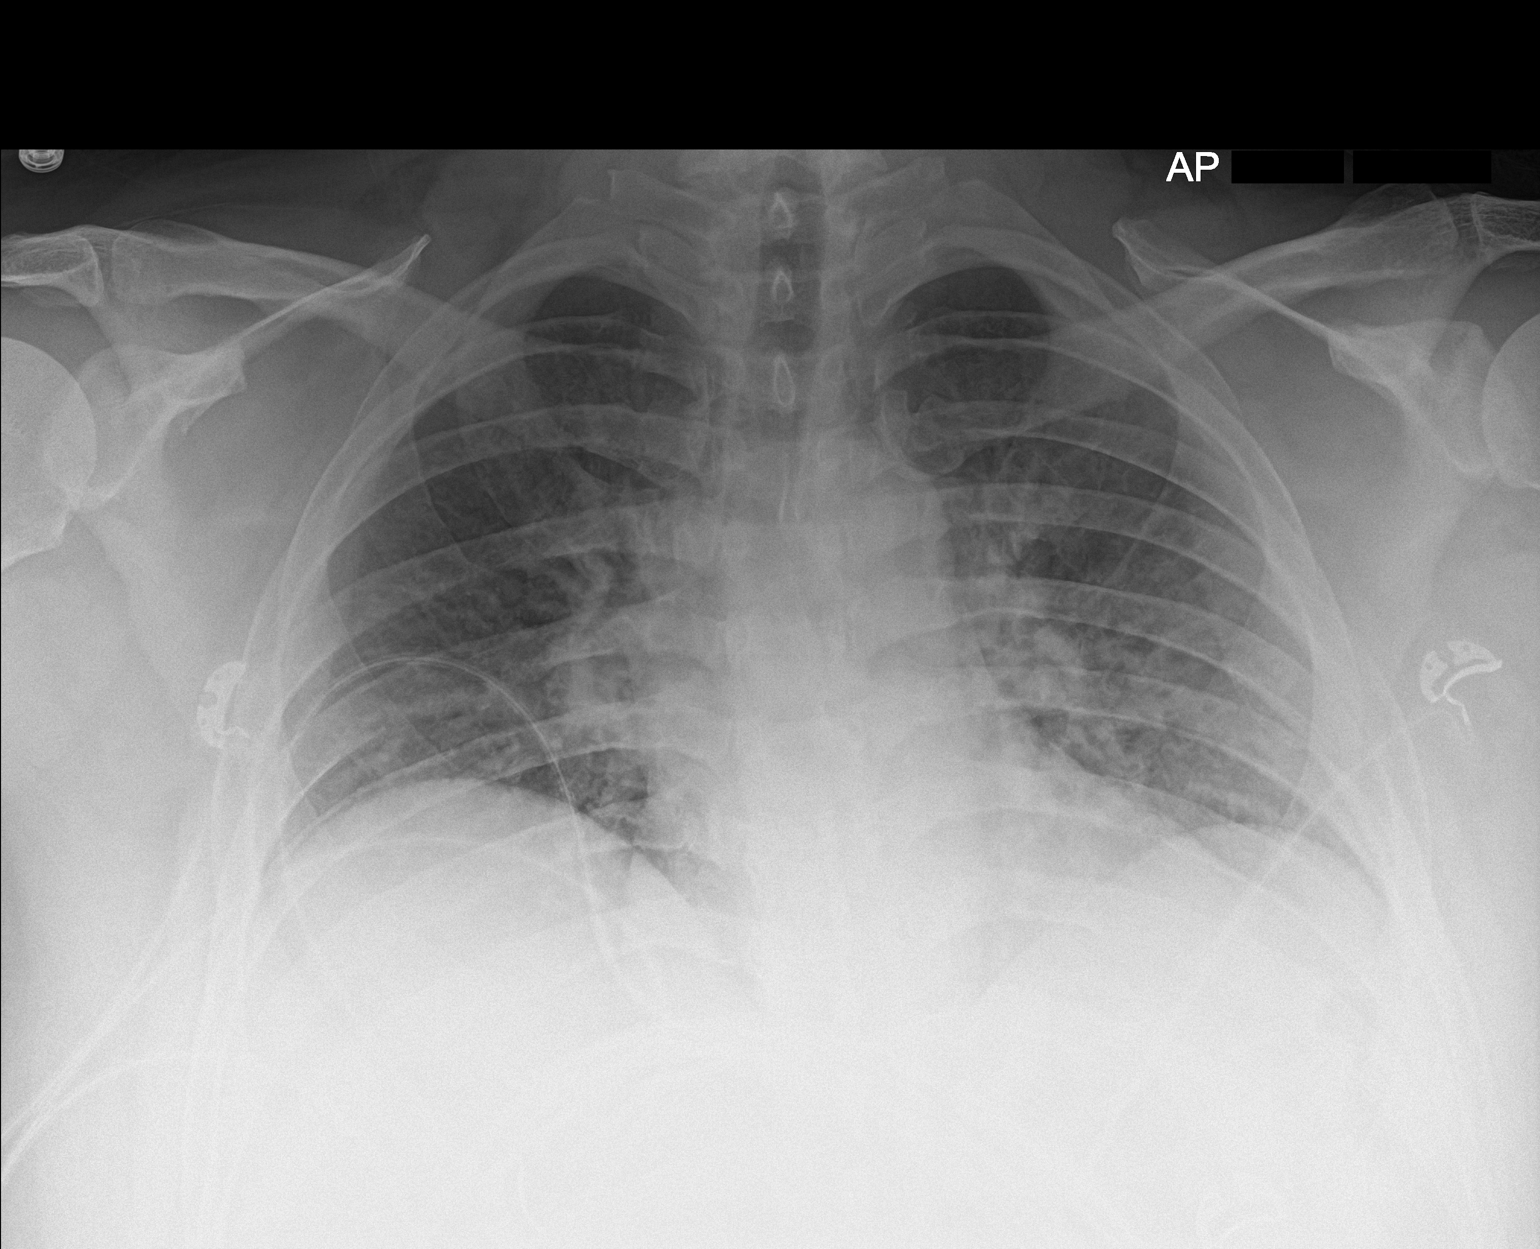

[1 of 1 positions shown; findings below may reference images not displayed]

FINDINGS: There is slightly increased hazy density in left mid and lower lung
zone relative to the right. Lung volumes are markedly low. No
pneumothorax or pleural effusion. No acute or focal bony
abnormality.
IMPRESSION: Mildly increased density in left mid and lower lung zones may be due
to atelectasis in this low volume chest but could represent
pneumonia.

## 2023-04-02 ENCOUNTER — Other Ambulatory Visit: Payer: Self-pay | Admitting: Family Medicine

## 2023-04-12 ENCOUNTER — Other Ambulatory Visit: Payer: Self-pay | Admitting: Family Medicine

## 2023-04-12 DIAGNOSIS — F988 Other specified behavioral and emotional disorders with onset usually occurring in childhood and adolescence: Secondary | ICD-10-CM

## 2023-05-09 DIAGNOSIS — F32A Depression, unspecified: Secondary | ICD-10-CM | POA: Diagnosis not present

## 2023-05-09 DIAGNOSIS — R7303 Prediabetes: Secondary | ICD-10-CM | POA: Diagnosis not present

## 2023-05-09 DIAGNOSIS — E663 Overweight: Secondary | ICD-10-CM | POA: Diagnosis not present

## 2023-05-09 DIAGNOSIS — R5383 Other fatigue: Secondary | ICD-10-CM | POA: Diagnosis not present

## 2023-05-09 DIAGNOSIS — E559 Vitamin D deficiency, unspecified: Secondary | ICD-10-CM | POA: Diagnosis not present

## 2023-05-09 DIAGNOSIS — E538 Deficiency of other specified B group vitamins: Secondary | ICD-10-CM | POA: Diagnosis not present

## 2023-05-09 DIAGNOSIS — M2550 Pain in unspecified joint: Secondary | ICD-10-CM | POA: Diagnosis not present

## 2023-05-13 ENCOUNTER — Other Ambulatory Visit: Payer: Self-pay | Admitting: Family Medicine

## 2023-05-13 DIAGNOSIS — F988 Other specified behavioral and emotional disorders with onset usually occurring in childhood and adolescence: Secondary | ICD-10-CM

## 2023-05-20 ENCOUNTER — Ambulatory Visit: Payer: BC Managed Care – PPO | Admitting: Family Medicine

## 2023-05-20 ENCOUNTER — Encounter: Payer: Self-pay | Admitting: Family Medicine

## 2023-05-20 VITALS — BP 120/72 | HR 85 | Ht 73.0 in | Wt 270.4 lb

## 2023-05-20 DIAGNOSIS — F331 Major depressive disorder, recurrent, moderate: Secondary | ICD-10-CM | POA: Diagnosis not present

## 2023-05-20 DIAGNOSIS — G4733 Obstructive sleep apnea (adult) (pediatric): Secondary | ICD-10-CM | POA: Diagnosis not present

## 2023-05-20 DIAGNOSIS — M25552 Pain in left hip: Secondary | ICD-10-CM | POA: Diagnosis not present

## 2023-05-20 DIAGNOSIS — F988 Other specified behavioral and emotional disorders with onset usually occurring in childhood and adolescence: Secondary | ICD-10-CM | POA: Diagnosis not present

## 2023-05-20 MED ORDER — DULOXETINE HCL 20 MG PO CPEP: 20.0000 mg | ORAL_CAPSULE | Freq: Every day | ORAL | 3 refills | Status: DC

## 2023-05-20 MED ORDER — ARIPIPRAZOLE 2 MG PO TABS: 2.0000 mg | ORAL_TABLET | Freq: Every day | ORAL | 2 refills | Status: DC

## 2023-05-20 MED ORDER — AMPHETAMINE-DEXTROAMPHETAMINE 10 MG PO TABS: ORAL_TABLET | ORAL | 0 refills | Status: DC

## 2023-05-20 NOTE — Assessment & Plan Note (Signed)
Anterior left hip pain worse with exertion.  Does not radiate down leg or to back.  Pain near the ASIS.  Ddx: tendonitis vs hip impingement syndrome.  Pt declined PT at this time.  Recommended pt use topical heat, topical pain medication, rest, routine stretching.  Advised not to overwork himself for the next month. Pt states if he is not better in a month would consider sports medicine or PT referral.

## 2023-05-20 NOTE — Assessment & Plan Note (Signed)
Doing well.  Takes both XR and IR adderall.  Sending in IR adderall 10mg  today

## 2023-05-20 NOTE — Assessment & Plan Note (Signed)
Pt has cpap machine and is poorly compliant d/t intolerance.  Would like inspire device.  Pt stated he needed a new referral for this.   - ref to sleep medicine

## 2023-05-20 NOTE — Assessment & Plan Note (Signed)
Decreased libido/erectile dysfunction with 40mg  duloxetine.  Will decrease back to 20mg  and add aripiprazole for adjunctive treatment after 2 weeks on 20mg .   -f/u 6 weeks - encouraged pt to continue therapy

## 2023-05-20 NOTE — Patient Instructions (Addendum)
It was nice to see you today,  We addressed the following topics today: -  we will decrease your duloxetine back to 20mg .   - I will send in the new prescription for aripiprazole.  Take it after taking 20mg  duloxetine for two weeks.  - for your hip pain I recommend stretching, using topical heat, topical voltaren gel, or oral NSAIDs if the previous methods don't work.   - I will send in a new referral to the sleep medicine specialist regarding your inspire device.   Have a great day,  Frederic Jericho, MD

## 2023-05-20 NOTE — Progress Notes (Unsigned)
Established Patient Office Visit  Subjective   Patient ID: Philip Daugherty, male    DOB: [birth year]  Age: 47 y.o. MRN: [id number]  Chief Complaint  Patient presents with   Medical Management of Chronic Issues    HPI  Adhd -patient following with his current dose.  Did not get dose of immediate release last time with previous refill.  Mdd -patient states that since increasing his duloxetine he has noticed decreased libido and erectile dysfunction.  When he was on 20 mg he had not had the side effects but it was not as effective.  Patient states he believes he has tried Paxil, Zoloft, Wellbutrin in the past.  Patient feels like his anxiety is more of an issue currently.  Usually bothers him when he has "time to think" thinks about catastrophic things such as "the end of the world".  He does not know why he has these thoughts.  He has a therapist but has not seen him since January due to them being an hour away.  We discussed alternative medication options.  Left hip pain-patient states for the last 1 to 2 months when he has been active or exerting himself he will start to get left-sided anterior hip pain that starts as a dull ache but will progress to a burning.  Does not radiate.  Does not feel like it is muscular.  Goes away with rest.  Usually resolves by the next morning.  Patient also wanted to discuss using the inspire device for OSA.  Currently has a CPAP machine but is poorly compliant due to intolerability of the machine.  Patient states he previously was too overweight to have the inspire device but has since lost weight.  States he was told he needs a new referral for the inspire device.   The 10-year ASCVD risk score (Arnett DK, et al., 2019) is: 2.2%  Health Maintenance Due  Topic Date Due   Hepatitis C Screening  Never done   DTaP/Tdap/Td (2 - Td or Tdap) 01/24/2022   COVID-19 Vaccine (3 - 2023-24 season) 06/15/2022   INFLUENZA VACCINE  05/16/2023      Objective:      BP 120/72   Pulse 85   Ht 6\' 1"  (1.854 m)   Wt 270 lb 6.4 oz (122.7 kg)   SpO2 96%   BMI 35.67 kg/m    Physical Exam General: alert, oriented Cv: rrr Pulm: lctab Msk: no tenderness of the L hip.  Mild hip pain with FABER. Negative FADIR.    No results found for any visits on 05/20/23.      Assessment & Plan:   Attention deficit disorder (ADD) without hyperactivity Assessment & Plan: Doing well.  Takes both XR and IR adderall.  Sending in IR adderall 10mg  today   Major depressive disorder, recurrent episode, moderate (HCC) Assessment & Plan: Decreased libido/erectile dysfunction with 40mg  duloxetine.  Will decrease back to 20mg  and add aripiprazole for adjunctive treatment after 2 weeks on 20mg .   -f/u 6 weeks - encouraged pt to continue therapy   OSA (obstructive sleep apnea) Assessment & Plan: Pt has cpap machine and is poorly compliant d/t intolerance.  Would like inspire device.  Pt stated he needed a new referral for this.   - ref to sleep medicine  Orders: -     PSG Sleep Study; Future  Pain of left hip Assessment & Plan: Anterior left hip pain worse with exertion.  Does not radiate down leg or to back.  Pain near the ASIS.  Ddx: tendonitis vs hip impingement syndrome.  Pt declined PT at this time.  Recommended pt use topical heat, topical pain medication, rest, routine stretching.  Advised not to overwork himself for the next month. Pt states if he is not better in a month would consider sports medicine or PT referral.     Other orders -     ARIPiprazole; Take 1 tablet (2 mg total) by mouth daily.  Dispense: 30 tablet; Refill: 2 -     DULoxetine HCl; Take 1 capsule (20 mg total) by mouth daily.  Dispense: 30 capsule; Refill: 3 -     Amphetamine-Dextroamphetamine; 1-2 tablets daily daily in the afternoon  Dispense: 60 tablet; Refill: 0     Return in about 6 weeks (around 07/01/2023) for anxiety.    Sandre Kitty, MD

## 2023-05-21 DIAGNOSIS — R5383 Other fatigue: Secondary | ICD-10-CM | POA: Diagnosis not present

## 2023-05-21 DIAGNOSIS — E663 Overweight: Secondary | ICD-10-CM | POA: Diagnosis not present

## 2023-05-21 DIAGNOSIS — M2550 Pain in unspecified joint: Secondary | ICD-10-CM | POA: Diagnosis not present

## 2023-05-21 DIAGNOSIS — F32A Depression, unspecified: Secondary | ICD-10-CM | POA: Diagnosis not present

## 2023-06-18 ENCOUNTER — Other Ambulatory Visit: Payer: Self-pay | Admitting: Family Medicine

## 2023-06-18 DIAGNOSIS — F988 Other specified behavioral and emotional disorders with onset usually occurring in childhood and adolescence: Secondary | ICD-10-CM

## 2023-07-01 ENCOUNTER — Ambulatory Visit: Payer: BC Managed Care – PPO | Admitting: Family Medicine

## 2023-07-01 VITALS — BP 124/76 | HR 87 | Ht 73.0 in | Wt 284.0 lb

## 2023-07-01 DIAGNOSIS — M25552 Pain in left hip: Secondary | ICD-10-CM | POA: Diagnosis not present

## 2023-07-01 DIAGNOSIS — G4733 Obstructive sleep apnea (adult) (pediatric): Secondary | ICD-10-CM

## 2023-07-01 DIAGNOSIS — F331 Major depressive disorder, recurrent, moderate: Secondary | ICD-10-CM

## 2023-07-01 NOTE — Assessment & Plan Note (Signed)
Pain has resolved.  No further management.

## 2023-07-01 NOTE — Assessment & Plan Note (Signed)
Patient has not heard from sleep medicine specialist.  Provided patient with number for [facility] long sleep center which is where is sleeps a order was sent to.  Advised patient to reach out to them and let us know if a repeat order is needed.

## 2023-07-01 NOTE — Patient Instructions (Addendum)
It was nice to see you today,  We addressed the following topics today: -I would like you to start taking 2 tablets of the Abilify every day.  If after 2 weeks you do not notice any improvement in your symptoms, I would like you to increase it again to 3 tablets.  Again after 2 weeks if 3 tablets do not help, you can increase to 4 tablets or 8 mg total.  By that time you will have followed up with me and we can decide if you need to try a different medication. - You can call the [facility] long sleep center [phone].  If they say they have not received a referral or order I can send in another one.   Have a great day,  Frederic Jericho, MD

## 2023-07-01 NOTE — Progress Notes (Unsigned)
Established Patient Office Visit  Subjective   Patient ID: Philip Daugherty, male    DOB: [birth year]  Age: 47 y.o. MRN: [id number]  Chief Complaint  Patient presents with   Medical Management of Chronic Issues    HPI  MDD-patient does not feel like his medication is working.  He decrease his duloxetine and started the 2 mg aripiprazole.  The issues with his libido have improved.  Main complaints are that he is irritable, not sleeping well.  Waking up early at 4 AM and not able to go back to sleep.  That is mostly occurring within the last 2 weeks.  For his anxiety he is able to take a THC gummy and this improves symptoms but says that does not help with depression symptoms.  OSA-patient has not yet heard from the sleep center regarding a referral.  Hip pain-patient states the hip pain is completely gone.  No questions or concerns.  Hypertension-patient not taking any blood pressure medications.  The 10-year ASCVD risk score (Arnett DK, et al., 2019) is: 2.3%  Health Maintenance Due  Topic Date Due   Hepatitis C Screening  Never done   DTaP/Tdap/Td (2 - Td or Tdap) 01/24/2022   INFLUENZA VACCINE  05/16/2023   COVID-19 Vaccine (3 - 2023-24 season) 06/16/2023      Objective:     BP 124/76   Pulse 87   Ht 6\' 1"  (1.854 m)   Wt 284 lb (128.8 kg)   SpO2 98%   BMI 37.47 kg/m  {Vitals History (Optional):23777}  Physical Exam General: Alert, oriented Pulmonary: No respiratory stress Psych: Pleasant affect, spontaneous speech, good eye contact.   No results found for any visits on 07/01/23.      Assessment & Plan:   Major depressive disorder, recurrent episode, moderate (HCC) Assessment & Plan: Decreased his duloxetine back down to 20 mg, side effects resolved.  On 2 mg aripiprazole but does not feel like it is being effective.  Discussed tapering off gradually until I see him again. - Increase to 4 mg daily.  After 2 weeks if not feeling like symptoms improved  advised to increase by 2 mg again.  Repeat this again up until we either feels like symptoms have improved or he reaches 8 mg. - Follow-up with me in 6 weeks.  If not at goal at that time can switch to Seroquel.   Pain of left hip Assessment & Plan: Pain has resolved.  No further management.   OSA (obstructive sleep apnea) Assessment & Plan: Patient has not heard from sleep medicine specialist.  Provided patient with number for [facility] long sleep center which is where is sleeps a order was sent to.  Advised patient to reach out to them and let us know if a repeat order is needed.      Return in about 6 weeks (around 08/12/2023) for mood.    Sandre Kitty, MD

## 2023-07-01 NOTE — Assessment & Plan Note (Signed)
Decreased his duloxetine back down to 20 mg, side effects resolved.  On 2 mg aripiprazole but does not feel like it is being effective.  Discussed tapering off gradually until I see him again. - Increase to 4 mg daily.  After 2 weeks if not feeling like symptoms improved advised to increase by 2 mg again.  Repeat this again up until we either feels like symptoms have improved or he reaches 8 mg. - Follow-up with me in 6 weeks.  If not at goal at that time can switch to Seroquel.

## 2023-07-12 ENCOUNTER — Encounter: Payer: Self-pay | Admitting: Family Medicine

## 2023-07-15 ENCOUNTER — Encounter: Payer: Self-pay | Admitting: Family Medicine

## 2023-07-15 ENCOUNTER — Other Ambulatory Visit: Payer: Self-pay | Admitting: Family Medicine

## 2023-07-15 DIAGNOSIS — L309 Dermatitis, unspecified: Secondary | ICD-10-CM

## 2023-07-15 MED ORDER — ARIPIPRAZOLE 5 MG PO TABS: 5.0000 mg | ORAL_TABLET | Freq: Every day | ORAL | 2 refills | Status: DC

## 2023-07-15 MED ORDER — CLOTRIMAZOLE-BETAMETHASONE 1-0.05 % EX LOTN: TOPICAL_LOTION | Freq: Two times a day (BID) | CUTANEOUS | 0 refills | Status: AC

## 2023-07-16 ENCOUNTER — Other Ambulatory Visit: Payer: Self-pay | Admitting: Family Medicine

## 2023-07-16 DIAGNOSIS — F988 Other specified behavioral and emotional disorders with onset usually occurring in childhood and adolescence: Secondary | ICD-10-CM

## 2023-08-23 ENCOUNTER — Other Ambulatory Visit: Payer: Self-pay | Admitting: Family Medicine

## 2023-08-23 DIAGNOSIS — F988 Other specified behavioral and emotional disorders with onset usually occurring in childhood and adolescence: Secondary | ICD-10-CM

## 2023-08-26 ENCOUNTER — Other Ambulatory Visit: Payer: Self-pay | Admitting: Family Medicine

## 2023-08-26 ENCOUNTER — Encounter: Payer: Self-pay | Admitting: Family Medicine

## 2023-08-26 DIAGNOSIS — N529 Male erectile dysfunction, unspecified: Secondary | ICD-10-CM

## 2023-08-26 MED ORDER — LISDEXAMFETAMINE DIMESYLATE 40 MG PO CAPS: 40.0000 mg | ORAL_CAPSULE | ORAL | 0 refills | Status: DC

## 2023-08-27 ENCOUNTER — Other Ambulatory Visit: Payer: Self-pay | Admitting: Medical Genetics

## 2023-08-27 DIAGNOSIS — Z006 Encounter for examination for normal comparison and control in clinical research program: Secondary | ICD-10-CM

## 2023-09-10 ENCOUNTER — Other Ambulatory Visit: Payer: Self-pay | Admitting: Family Medicine

## 2023-09-10 DIAGNOSIS — N529 Male erectile dysfunction, unspecified: Secondary | ICD-10-CM

## 2023-09-17 DIAGNOSIS — M2550 Pain in unspecified joint: Secondary | ICD-10-CM | POA: Diagnosis not present

## 2023-09-17 DIAGNOSIS — R7303 Prediabetes: Secondary | ICD-10-CM | POA: Diagnosis not present

## 2023-09-17 DIAGNOSIS — E291 Testicular hypofunction: Secondary | ICD-10-CM | POA: Diagnosis not present

## 2023-09-17 DIAGNOSIS — R5383 Other fatigue: Secondary | ICD-10-CM | POA: Diagnosis not present

## 2023-09-17 DIAGNOSIS — F32A Depression, unspecified: Secondary | ICD-10-CM | POA: Diagnosis not present

## 2023-09-17 DIAGNOSIS — R748 Abnormal levels of other serum enzymes: Secondary | ICD-10-CM | POA: Diagnosis not present

## 2023-09-17 DIAGNOSIS — E663 Overweight: Secondary | ICD-10-CM | POA: Diagnosis not present

## 2023-09-25 ENCOUNTER — Other Ambulatory Visit: Payer: Self-pay | Admitting: Family Medicine

## 2023-09-27 ENCOUNTER — Other Ambulatory Visit (HOSPITAL_COMMUNITY): Payer: BC Managed Care – PPO | Attending: Medical Genetics

## 2023-10-01 ENCOUNTER — Encounter: Payer: Self-pay | Admitting: Family Medicine

## 2023-10-01 ENCOUNTER — Other Ambulatory Visit: Payer: Self-pay | Admitting: Family Medicine

## 2023-10-01 MED ORDER — ARIPIPRAZOLE 5 MG PO TABS: 7.5000 mg | ORAL_TABLET | Freq: Every day | ORAL | 2 refills | Status: DC

## 2023-10-07 ENCOUNTER — Ambulatory Visit: Payer: BC Managed Care – PPO | Admitting: Family Medicine

## 2023-10-07 NOTE — Progress Notes (Deleted)
   Established Patient Office Visit  Subjective   Patient ID: Philip Daugherty, male    DOB: [birth year]  Age: 47 y.o. MRN: [id number]  No chief complaint on file.   HPI  Depression-added Abilify.  Duloxetine should be 20 mg.  Abilify should be 7.5 mg.  ADHD-Vyvanse.  Was on Adderall before.   The 10-year ASCVD risk score (Arnett DK, et al., 2019) is: 2.5%  Health Maintenance Due  Topic Date Due   Hepatitis C Screening  Never done   DTaP/Tdap/Td (2 - Td or Tdap) 01/24/2022   INFLUENZA VACCINE  05/16/2023   COVID-19 Vaccine (3 - 2024-25 season) 06/16/2023      Objective:     There were no vitals taken for this visit. {Vitals History (Optional):23777}  Physical Exam   No results found for any visits on 10/07/23.      Assessment & Plan:   There are no diagnoses linked to this encounter.   No follow-ups on file.    Sandre Kitty, MD

## 2023-10-28 ENCOUNTER — Encounter: Payer: Self-pay | Admitting: Family Medicine

## 2023-10-29 ENCOUNTER — Other Ambulatory Visit: Payer: Self-pay | Admitting: Family Medicine

## 2023-10-29 DIAGNOSIS — N529 Male erectile dysfunction, unspecified: Secondary | ICD-10-CM

## 2023-10-29 MED ORDER — LISDEXAMFETAMINE DIMESYLATE 50 MG PO CAPS: 50.0000 mg | ORAL_CAPSULE | ORAL | 0 refills | Status: DC

## 2023-10-29 MED ORDER — TADALAFIL 10 MG PO TABS: 10.0000 mg | ORAL_TABLET | ORAL | 2 refills | Status: DC | PRN

## 2023-11-12 NOTE — Progress Notes (Unsigned)
   Established Patient Office Visit  Subjective   Patient ID: Philip Daugherty, male    DOB: January 09, 1976  Age: 48 y.o. MRN: [id number]  No chief complaint on file.   HPI  Depression -  - 7.5mg .  duloxetine 20mg .   Osa - did he schedule?   Adhd - vyvanse  Ed - tadalafil  Testostrone 1.5 ml.  Weekly. Need repeat labs.    The 10-year ASCVD risk score (Arnett DK, et al., 2019) is: 2.5%  Health Maintenance Due  Topic Date Due   Hepatitis C Screening  Never done   DTaP/Tdap/Td (2 - Td or Tdap) 01/24/2022   INFLUENZA VACCINE  05/16/2023   COVID-19 Vaccine (3 - 2024-25 season) 06/16/2023      Objective:     There were no vitals taken for this visit. {Vitals History (Optional):23777}  Physical Exam   No results found for any visits on 11/13/23.      Assessment & Plan:   There are no diagnoses linked to this encounter.   No follow-ups on file.    Sandre Kitty, MD

## 2023-11-13 ENCOUNTER — Ambulatory Visit: Payer: BC Managed Care – PPO | Admitting: Family Medicine

## 2023-11-13 ENCOUNTER — Encounter: Payer: Self-pay | Admitting: Family Medicine

## 2023-11-13 VITALS — BP 139/89 | HR 92 | Wt 277.0 lb

## 2023-11-13 DIAGNOSIS — F331 Major depressive disorder, recurrent, moderate: Secondary | ICD-10-CM | POA: Diagnosis not present

## 2023-11-13 DIAGNOSIS — F988 Other specified behavioral and emotional disorders with onset usually occurring in childhood and adolescence: Secondary | ICD-10-CM | POA: Diagnosis not present

## 2023-11-13 DIAGNOSIS — N529 Male erectile dysfunction, unspecified: Secondary | ICD-10-CM | POA: Insufficient documentation

## 2023-11-13 DIAGNOSIS — E291 Testicular hypofunction: Secondary | ICD-10-CM | POA: Diagnosis not present

## 2023-11-13 MED ORDER — LISDEXAMFETAMINE DIMESYLATE 50 MG PO CAPS: 50.0000 mg | ORAL_CAPSULE | ORAL | 0 refills | Status: DC

## 2023-11-13 NOTE — Patient Instructions (Addendum)
It was nice to see you today,  We addressed the following topics today: -No changes to your medications today. - The number for the sleep study center [phone] - Schedule a visit for me in 3 months to discuss your Vyvanse.  This can be a virtual appointment.  Have a great day,  Frederic Jericho, MD

## 2023-11-13 NOTE — Assessment & Plan Note (Signed)
Continue tadalafil as needed 10 mg.

## 2023-11-13 NOTE — Assessment & Plan Note (Signed)
Patient has weaned off Abilify.  Still on duloxetine.  Will not add any additional medication at this time.  Continue to monitor

## 2023-11-13 NOTE — Assessment & Plan Note (Signed)
Injects testosterone weekly on the weekends.  Agreeable to lab work.  PSA, CBC, CMP, testosterone level

## 2023-11-13 NOTE — Assessment & Plan Note (Signed)
Satisfied with the current dose of Vyvanse.  Follow-up in 3 months.  No changes.

## 2023-11-15 ENCOUNTER — Other Ambulatory Visit: Payer: BC Managed Care – PPO

## 2023-11-15 DIAGNOSIS — E291 Testicular hypofunction: Secondary | ICD-10-CM

## 2023-11-16 LAB — CBC
Hematocrit: 53.4 % — ABNORMAL HIGH (ref 37.5–51.0)
Hemoglobin: 18.1 g/dL — ABNORMAL HIGH (ref 13.0–17.7)
MCH: 30.3 pg (ref 26.6–33.0)
MCHC: 33.9 g/dL (ref 31.5–35.7)
MCV: 89 fL (ref 79–97)
Platelets: 377 10*3/uL (ref 150–450)
RBC: 5.98 x10E6/uL — ABNORMAL HIGH (ref 4.14–5.80)
RDW: 12.7 % (ref 11.6–15.4)
WBC: 10.2 10*3/uL (ref 3.4–10.8)

## 2023-11-16 LAB — LIPID PANEL
Chol/HDL Ratio: 4.8 {ratio} (ref 0.0–5.0)
Cholesterol, Total: 186 mg/dL (ref 100–199)
HDL: 39 mg/dL — ABNORMAL LOW (ref >39)
LDL Chol Calc (NIH): 130 mg/dL — ABNORMAL HIGH (ref 0–99)
Triglycerides: 91 mg/dL (ref 0–149)
VLDL Cholesterol Cal: 17 mg/dL (ref 5–40)

## 2023-11-16 LAB — PSA: Prostate Specific Ag, Serum: 0.6 ng/mL (ref 0.0–4.0)

## 2023-11-16 LAB — TESTOSTERONE: Testosterone: >1500 ng/dL — ABNORMAL HIGH (ref 264–916)

## 2023-11-20 ENCOUNTER — Encounter: Payer: Self-pay | Admitting: Family Medicine

## 2023-12-03 ENCOUNTER — Encounter: Payer: Self-pay | Admitting: Family Medicine

## 2023-12-03 ENCOUNTER — Other Ambulatory Visit: Payer: Self-pay | Admitting: Family Medicine

## 2023-12-03 MED ORDER — ARIPIPRAZOLE 5 MG PO TABS: 5.0000 mg | ORAL_TABLET | Freq: Every day | ORAL | 0 refills | Status: DC

## 2023-12-17 DIAGNOSIS — F4323 Adjustment disorder with mixed anxiety and depressed mood: Secondary | ICD-10-CM | POA: Diagnosis not present

## 2023-12-20 DIAGNOSIS — E291 Testicular hypofunction: Secondary | ICD-10-CM | POA: Diagnosis not present

## 2023-12-20 DIAGNOSIS — M2550 Pain in unspecified joint: Secondary | ICD-10-CM | POA: Diagnosis not present

## 2023-12-20 DIAGNOSIS — F32A Depression, unspecified: Secondary | ICD-10-CM | POA: Diagnosis not present

## 2023-12-20 DIAGNOSIS — R7303 Prediabetes: Secondary | ICD-10-CM | POA: Diagnosis not present

## 2023-12-20 DIAGNOSIS — R5383 Other fatigue: Secondary | ICD-10-CM | POA: Diagnosis not present

## 2023-12-20 DIAGNOSIS — E663 Overweight: Secondary | ICD-10-CM | POA: Diagnosis not present

## 2023-12-20 DIAGNOSIS — R748 Abnormal levels of other serum enzymes: Secondary | ICD-10-CM | POA: Diagnosis not present

## 2023-12-24 DIAGNOSIS — F4323 Adjustment disorder with mixed anxiety and depressed mood: Secondary | ICD-10-CM | POA: Diagnosis not present

## 2023-12-30 ENCOUNTER — Ambulatory Visit (HOSPITAL_BASED_OUTPATIENT_CLINIC_OR_DEPARTMENT_OTHER): Attending: Family Medicine | Admitting: Internal Medicine

## 2023-12-30 DIAGNOSIS — G4733 Obstructive sleep apnea (adult) (pediatric): Secondary | ICD-10-CM | POA: Diagnosis not present

## 2023-12-31 ENCOUNTER — Other Ambulatory Visit: Payer: Self-pay | Admitting: Family Medicine

## 2023-12-31 DIAGNOSIS — F4323 Adjustment disorder with mixed anxiety and depressed mood: Secondary | ICD-10-CM | POA: Diagnosis not present

## 2023-12-31 MED ORDER — ARIPIPRAZOLE 5 MG PO TABS: 5.0000 mg | ORAL_TABLET | Freq: Every day | ORAL | 2 refills | Status: DC

## 2024-01-05 DIAGNOSIS — G4733 Obstructive sleep apnea (adult) (pediatric): Secondary | ICD-10-CM

## 2024-01-05 NOTE — Procedures (Signed)
 [facility] [address] [place], [place] [address] Tel: [phone]   Fax: [phone]  Polysomnography Interpretation  Patient Name:  Philip Daugherty, Philip Daugherty Date:  2023-12-30 Referring Physician:  Dr. Lenor Coffin  Indications for Polysomnography The patient is a 48 year old Male who is 6\' 1"  and weighs 568.8 lbs. His BMI equals 75.4.  A full night polysomnogram was performed to evaluate for -.osa  Medication  None reported   Polysomnogram Data A full night polysomnogram recorded the standard physiologic parameters including EEG, EOG, EMG, EKG, nasal and oral airflow.  Respiratory parameters of chest and abdominal movements were recorded with Respiratory Inductance Plethysmography belts.  Oxygen saturation was recorded by pulse oximetry.   Sleep Architecture The total recording time of the polysomnogram was 426.2 minutes.  The total sleep time was 309.5 minutes.  The patient spent 22.1% of total sleep time in Stage N1, 64.3% in Stage N2, 5.0% in Stages N3, and 8.6% in REM.  Sleep latency was 59.3 minutes.  REM latency was 136.0 minutes.  Sleep Efficiency was 72.6%.  Wake after Sleep Onset time was 57.0 minutes.  Respiratory Events The polysomnogram revealed a presence of 8 obstructive, 3 central, and 2 mixed apneas resulting in an Apnea index of 2.5 events per hour.  There were 77 hypopneas (>=3% desaturation and/or arousal) resulting in an Apnea\Hypopnea Index (AHI >=3% desaturation and/or arousal) of 17.4 events per hour.  There were 62 hypopneas (>=4% desaturation) resulting in an Apnea\Hypopnea Index (AHI >=4% desaturation) of 14.5 events per hour.  There were 113 Respiratory Effort Related Arousals resulting in a RERA index of 21.9 events per hour. The Respiratory Disturbance Index is 39.4 events per hour.  The snore index was 241.9 events per hour.  Mean oxygen saturation was 91.6%.  The lowest oxygen saturation during sleep was 80.0%.   Time spent <=88% oxygen saturation was 31.2 minutes (7.4%).  End Tidal CO2 during sleep ranged from - to - mmHg. End Tidal CO2 was greater than 50 mmHg for - minutes and greater than 55 mmHg for - minutes.  Limb Activity There were 1 total limb movements recorded, of this total, - were classified as PLMs.  PLM index was - per hour and PLM associated with Arousals index was - per hour.  Cardiac Summary The average pulse rate was 67.8 bpm.  The minimum pulse rate was 47.0 bpm while the maximum pulse rate was 101.0 bpm.  Cardiac rhythm was normal with frequent PVCs noted.  Comment:  Mild to moderate obstructive sleep apnea, AHI (4%) 14.5/hr. Snoring with oxygen desaturation to a nadir of 80% and mean 91.6%.  Diagnosis: Obstructive sleep apnea  Recommendations: Suggest CPAP titration sleep study, autopap or a fitted oral appliance. Other options would be based on clinical judgment.   This study was personally reviewed and electronically signed by: Dr. Jetty Duhamel Accredited Board Certified in Sleep Medicine Date/Time: 01/05/24  12:00        Diagnostic PSG Report  Patient Name: Philip Daugherty, Philip Daugherty Date: 2023-12-30  Date of Birth: [birth year] Study Type: Diagnostic  Age: 36 year MRN #: [id number]  Sex: Male Interpreting Physician: Jetty Duhamel [id number]  Height: 6\' 1"  Referring Physician: Dr. Lenor Coffin  Weight: 568.8 lbs Recording Tech: Ulyess Mort RRT RPSGT RST  BMI: 75.4 Scoring Tech: Ulyess Mort RRT RPSGT RST  ESS: 9 Neck Size: 17.5   Study Overview  Lights Off: 09:50:53 PM  Count Index  Lights On: 04:57:07 AM Awakenings: 41  7.9  Time in Bed: 426.2 min. Arousals: 281 54.5  Total Sleep Time: 309.5 min. AHI (>=3% Desat and/or Ar.): 90 17.4   Sleep Efficiency: 72.6% AHI (>=4% Desat): 75 14.5   Sleep Latency: 59.3 min. Limb Movements: 1 0.2  Wake After Sleep Onset: 57.0 min. Snore: 1248 241.9  REM Latency from Sleep Onset: 136.0 min. Desaturations: 86 16.7      Minimum SpO2 TST: 80.0%    Sleep Architecture  % of Time in Bed Stages Time (mins) % Sleep Time  Wake 117.0   Stage N1 68.5 22.1%  Stage N2 199.0 64.3%  Stage N3 15.5 5.0%  REM 26.5 8.6%   Arousal Summary   NREM REM Sleep Index  Respiratory Arousals 127 8 135 26.2  PLM Arousals - - - -  Isolated Limb Movement Arousals 1 - 1 0.2  Snore Arousals 43 1 44 8.5  Spontaneous Arousals 100 1 101 19.6  Total 271 10 281 54.5   Limb Movement Summary   Count Index  Isolated Limb Movements 1 0.2  Periodic Limb Movements (PLMs) - -  Total Limb Movements 1 0.2  Respiratory Summary   By Sleep Stage By Body Position Total   NREM REM Supine Non-Supine   Time (min) 283.0 26.5 118.0 191.5 309.5         Obstructive Apnea 6 2 8  - 8  Mixed Apnea 2 - 2 - 2  Central Apnea 2 1 2 1 3   Total Apneas 10 3 12 1 13   Total Apnea Index 2.1 6.8 6.1 0.3 2.5         Hypopneas (>=3% Desat and/or Ar.) 65 12 72 5 77  AHI (>=3% Desat and/or Ar.) 15.9 34.0 42.7 1.9 17.4         Hypopneas (>=4% Desat) 51 11 60 2 62  AHI (>=4% Desat) 12.9 31.7 36.6 0.9 14.5          RERAs 109 4 71 42 113  RERA Index 23.1 9.1 36.1 13.2 21.9         RDI 39.0 43.0 78.8 15.0 39.4    Respiratory Event Type Index  Central Apneas 0.6  Obstructive Apneas 1.6  Mixed Apneas 0.4  Central Hypopneas -  Obstructive Hypopneas 15.3  Central Apnea + Hypopnea (CAHI) 0.6  Obstructive Apnea + Hypopnea (OAHI) 17.3   Respiratory Event Durations   Apnea Hypopnea   NREM REM NREM REM  Average (seconds) 14.6 16.7 22.5 37.3  Maximum (seconds) 17.0 25.7 77.7 87.8    Oxygen Saturation Summary   Wake NREM REM TST TIB  Average SpO2 (%) 92.9% 91.3% 90.1% 91.2% 91.6%  Minimum SpO2 (%) 83.0% 82.0% 80.0% 80.0% 80.0%  Maximum SpO2 (%) 98.0% 97.0% 95.0% 97.0% 98.0%   Oxygen Saturation Distribution  Range (%) Time in range (min) Time in range (%)  90.0 - 100.0 328.9 78.1%  80.0 - 90.0 92.3 21.9%  70.0 - 80.0 0.0 0.0%  60.0 - 70.0 - -   50.0 - 60.0 - -  0.0 - 50.0 - -  Time Spent <=88% SpO2  Range (%) Time in range (min) Time in range (%)  0.0 - 88.0 31.2 7.4%      Count Index  Desaturations 86 16.7    Cardiac Summary   Wake NREM REM Sleep Total  Average Pulse Rate (BPM) 71.4 66.1 70.4 66.5 67.8  Minimum Pulse Rate (BPM) 47.0 55.0 59.0 55.0 47.0  Maximum Pulse Rate (BPM) 101.0 90.0 84.0 90.0 101.0  Pulse Rate Distribution:  Range (bpm) Time in range (min) Time in range (%)  0.0 - 40.0 - -  40.0 - 60.0 44.7 10.6%  60.0 - 80.0 365.6 86.5%  80.0 - 100.0 8.0 1.9%  100.0 - 120.0 0.0 0.0%  120.0 - 140.0 - -  140.0 - 200.0 - -   EtCO2 Summary  Stage Min (mmHg) Average (mmHg) Max (mmHg)  Wake - - -  NREM(1+2+3) - - -  REM - - -   EtCO2 Distribution:  Range (mmHg) Time in range (min) Time in range (%)  20.0 - 40.0 - -  40.0 - 50.0 - -  50.0 - 100.0 - -  55.0 - 100.0 - -  Excluded data <20.0 & >65.0 426.5 100.0%     Hypnograms                         Comments    Patient was ordered as a NPSG only. Patient was sent to the sleep center for OSA. No medications were reported taken while at the Sleep Center. No oxygen was applied. Questionable cardiac arrhythmias were noted see attached sheets and epochs: 416, 468, 562, 740, 748, etc. No PLM'S/PLMA's were noted. Moderate to loud audible snoring was noted during the night. No restroom visits were noted. -Tech                        [employer], Biomedical engineer of Sleep Medicine  ELECTRONICALLY SIGNED ON:  01/05/2024, 11:56 AM [facility] PH: [phone]   FX: [phone] ACCREDITED BY THE AMERICAN ACADEMY OF SLEEP MEDICINE

## 2024-01-14 ENCOUNTER — Telehealth: Payer: Self-pay

## 2024-01-14 NOTE — Telephone Encounter (Signed)
-----   Message from Sandre Kitty sent at 01/14/2024  9:38 AM EDT -----    ----- Message ----- From: Waymon Budge, MD Sent: 01/05/2024  12:03 PM EDT To: Sandre Kitty, MD

## 2024-01-14 NOTE — Telephone Encounter (Signed)
 Called pt LVM to contact the office

## 2024-01-14 NOTE — Progress Notes (Signed)
 Please call the patient and let him know that his sleep study showed he has sleep apnea.  The two options going forward would be to do another sleep study in which he wears a cpap and titrates it to the appropriate setting or to send in an order for a cpap with autotitration settings that he would begin wearing at home.  It would be preferable to get the cpap titration study but it is up to him.

## 2024-01-14 NOTE — Progress Notes (Signed)
 Called pt LVM to contact the office

## 2024-02-04 ENCOUNTER — Other Ambulatory Visit: Payer: Self-pay | Admitting: Family Medicine

## 2024-02-04 MED ORDER — LISDEXAMFETAMINE DIMESYLATE 50 MG PO CAPS: 50.0000 mg | ORAL_CAPSULE | ORAL | 0 refills | Status: DC

## 2024-02-11 ENCOUNTER — Encounter: Payer: Self-pay | Admitting: Family Medicine

## 2024-02-11 ENCOUNTER — Telehealth (INDEPENDENT_AMBULATORY_CARE_PROVIDER_SITE_OTHER): Payer: BC Managed Care – PPO | Admitting: Family Medicine

## 2024-02-11 DIAGNOSIS — F988 Other specified behavioral and emotional disorders with onset usually occurring in childhood and adolescence: Secondary | ICD-10-CM

## 2024-02-11 DIAGNOSIS — G4733 Obstructive sleep apnea (adult) (pediatric): Secondary | ICD-10-CM

## 2024-02-11 NOTE — Progress Notes (Unsigned)
   Established Patient Office Visit  Subjective   Patient ID: Philip Daugherty, male    DOB: December 05, 1975  Age: 48 y.o. MRN: [id number]  No chief complaint on file.   HPI Patient location: Office Provider location: [facility] primary care Method of visit: Video Duration of call 18 minutes.  Sleep apnea-we discussed the patient's recent sleep study.  sleep study was obtained for help with determining if he would benefit from an inspire device.  He does not tolerate his CPAP currently.  ADHD-patient would like to switch back to the Adderall XR 20 mg.  He switches it 1-2 times a year when a tolerance to one of them is built up.  Anxiety-patient okay with his dose of Abilify  at 5 mg.  No questions or concerns.   The 10-year ASCVD risk score (Arnett DK, et al., 2019) is: 3.5%  Health Maintenance Due  Topic Date Due   Hepatitis C Screening  Never done   DTaP/Tdap/Td (2 - Td or Tdap) 01/24/2022   COVID-19 Vaccine (3 - 2024-25 season) 06/16/2023      Objective:     There were no vitals taken for this visit. {Vitals History (Optional):23777}  Physical Exam General: Alert, oriented Pulmonary: No respiratory distress, able to speak complete sentences without issue Psych: Pleasant affect   No results found for any visits on 02/11/24.      Assessment & Plan:   There are no diagnoses linked to this encounter.   No follow-ups on file.    Laneta Pintos, MD

## 2024-02-19 DIAGNOSIS — E663 Overweight: Secondary | ICD-10-CM | POA: Diagnosis not present

## 2024-02-19 DIAGNOSIS — R5383 Other fatigue: Secondary | ICD-10-CM | POA: Diagnosis not present

## 2024-02-19 DIAGNOSIS — R7303 Prediabetes: Secondary | ICD-10-CM | POA: Diagnosis not present

## 2024-02-19 DIAGNOSIS — E291 Testicular hypofunction: Secondary | ICD-10-CM | POA: Diagnosis not present

## 2024-02-24 MED ORDER — AMPHETAMINE-DEXTROAMPHET ER 20 MG PO CP24
40.0000 mg | ORAL_CAPSULE | Freq: Every day | ORAL | 0 refills | Status: DC
Start: 2024-04-04 — End: 2024-05-07

## 2024-02-24 MED ORDER — AMPHETAMINE-DEXTROAMPHET ER 20 MG PO CP24: 40.0000 mg | ORAL_CAPSULE | Freq: Every day | ORAL | 0 refills | Status: DC

## 2024-02-24 NOTE — Assessment & Plan Note (Signed)
 Switching to adderall xr 40mg  daily from vyvanse .

## 2024-02-24 NOTE — Assessment & Plan Note (Addendum)
 Sending in referral for ent, dr. Soldatova for discussion of inspire device.   Intolerant of cpap

## 2024-03-11 ENCOUNTER — Encounter: Payer: Self-pay | Admitting: Family Medicine

## 2024-03-12 ENCOUNTER — Other Ambulatory Visit: Payer: Self-pay | Admitting: Family Medicine

## 2024-03-12 MED ORDER — BUSPIRONE HCL 7.5 MG PO TABS: 7.5000 mg | ORAL_TABLET | Freq: Two times a day (BID) | ORAL | 2 refills | Status: DC

## 2024-04-27 DIAGNOSIS — F4323 Adjustment disorder with mixed anxiety and depressed mood: Secondary | ICD-10-CM | POA: Diagnosis not present

## 2024-05-05 DIAGNOSIS — F4323 Adjustment disorder with mixed anxiety and depressed mood: Secondary | ICD-10-CM | POA: Diagnosis not present

## 2024-05-07 ENCOUNTER — Ambulatory Visit

## 2024-05-07 ENCOUNTER — Ambulatory Visit: Payer: Self-pay

## 2024-05-07 VITALS — BP 111/71 | HR 94 | Temp 97.8°F | Ht 73.0 in | Wt 274.1 lb

## 2024-05-07 DIAGNOSIS — F331 Major depressive disorder, recurrent, moderate: Secondary | ICD-10-CM | POA: Diagnosis not present

## 2024-05-07 DIAGNOSIS — F988 Other specified behavioral and emotional disorders with onset usually occurring in childhood and adolescence: Secondary | ICD-10-CM

## 2024-05-07 DIAGNOSIS — F411 Generalized anxiety disorder: Secondary | ICD-10-CM

## 2024-05-07 DIAGNOSIS — R000 Tachycardia, unspecified: Secondary | ICD-10-CM | POA: Diagnosis not present

## 2024-05-07 MED ORDER — AMPHETAMINE-DEXTROAMPHET ER 20 MG PO CP24: 40.0000 mg | ORAL_CAPSULE | Freq: Every day | ORAL | 0 refills | Status: DC

## 2024-05-07 MED ORDER — HYDROXYZINE HCL 50 MG PO TABS: 50.0000 mg | ORAL_TABLET | Freq: Three times a day (TID) | ORAL | 0 refills | Status: DC | PRN

## 2024-05-07 MED ORDER — BUSPIRONE HCL 10 MG PO TABS: 10.0000 mg | ORAL_TABLET | Freq: Two times a day (BID) | ORAL | 2 refills | Status: DC

## 2024-05-07 NOTE — Assessment & Plan Note (Signed)
 Continue Abilify  2.5 mg, Cymbalta  20 mg.  Have also placed referral to psychiatry for further medication management and GeneSight testing.  Patient agreeable to referral.  Will continue to monitor.

## 2024-05-07 NOTE — Assessment & Plan Note (Signed)
 Symptoms stable and well-controlled.  Continue Adderall XR 40 mg daily as needed for ADHD.  PDMP reviewed, no aberrancies.  Three 30-day prescriptions sent to pharmacy today.  Will plan to follow-up again on ADHD in 3 months.

## 2024-05-07 NOTE — Patient Instructions (Signed)
 It was nice to see you today!  As we discussed in clinic:  -Continue all your current medications as prescribed. - I have increased your buspirone  from 7.5 mg twice daily to 10 mg twice daily.  If you tolerate this increased dose well after 7 days, you may add a third dose of 10 mg at bedtime. - I have also increased your hydroxyzine  from 25 mg to 50 mg as needed for acute anxiety attacks. -I have also placed a referral to psychiatry for GeneSight testing.  They will give you a call to get this scheduled.  We will see you back in 3 months for a follow-up.  Please let us  know if you need anything in the meantime!  If you have any problems before your next visit feel free to message me via MyChart (minor issues or questions) or call the office, otherwise you may reach out to schedule an office visit.  Thank you! Saddie Sacks, PA-C

## 2024-05-07 NOTE — Assessment & Plan Note (Addendum)
 PHQ-9 score: 9, GAD-7 score: 14.  Suspect that singular episode of rapid heart rate likely related to anxiety and/or side effect of the delta 8 gummy he took 30 minutes before.  Continue Abilify  2.5 mg daily, Cymbalta  20 mg daily.  Have increased his buspirone  from 7.5 mg twice daily to 10 mg twice daily.  Advised patient that if he tolerates the increased dose of buspirone  well after 7 days, he may increase to 3 times daily if needed.  Have also sent in an increased dose of his hydroxyzine  from 25 mg to 50 mg to take as needed for acute anxiety.  Will follow-up on anxiety in 3 months.

## 2024-05-07 NOTE — Telephone Encounter (Signed)
 FYI Only or Action Required?: Action required by provider: request for appointment.  Patient was last seen in primary care on 02/11/2024 by Chandra Toribio POUR, MD.  Called Nurse Triage reporting Anxiety.  Symptoms began yesterday.  Interventions attempted: Prescription medications: Buspar .  Symptoms are: gradually worsening.  Triage Disposition: See PCP When Office is Open (Within 3 Days)  Patient/caregiver understands and will follow disposition?: YesCopied from CRM #[id number]. Topic: Clinical - Red Word Triage >> May 07, 2024  9:42 AM Ivette P wrote: Red Word that prompted transfer to Nurse Triage: Episode - called paramedics. beleived to have heart attack. schedule an appointemtn. to reveiw medication believe it was panic episode. Reason for Disposition  MODERATE anxiety (e.g., persistent or frequent anxiety symptoms; interferes with sleep, school, or work)  Answer Assessment - Initial Assessment Questions 1. CONCERN: Did anything happen that prompted you to call today?      EMS called yesterday  2. ANXIETY SYMPTOMS: Can you describe how you (your loved one; patient) have been feeling? (e.g., tense, restless, panicky, anxious, keyed up, overwhelmed, sense of impending doom).      anxious 3. ONSET: How long have you been feeling this way? (e.g., hours, days, weeks)     Several months 4. SEVERITY: How would you rate the level of anxiety? (e.g., 0 - 10; or mild, moderate, severe).     6 5. FUNCTIONAL IMPAIRMENT: How have these feelings affected your ability to do daily activities? Have you had more difficulty than usual doing your normal daily activities? (e.g., getting better, same, worse; self-care, school, work, interactions)     Yes- worse as time as goes on 6. HISTORY: Have you felt this way before? Have you ever been diagnosed with an anxiety problem in the past? (e.g., generalized anxiety disorder, panic attacks, PTSD). If Yes, ask: How was this problem treated?  (e.g., medicines, counseling, etc.)     yes 7. RISK OF HARM - SUICIDAL IDEATION: Do you ever have thoughts of hurting or killing yourself? If Yes, ask:  Do you have these feelings now? Do you have a plan on how you would do this?     denies 8. TREATMENT:  What has been done so far to treat this anxiety? (e.g., medicines, relaxation strategies). What has helped?     Buspirone  in May 9. THERAPIST: Do you have a counselor or therapist? If Yes, ask: What is their name?     yes 10. POTENTIAL TRIGGERS: Do you drink caffeinated beverages (e.g., coffee, colas, teas), and how much daily? Do you drink alcohol or use any drugs? Have you started any new medicines recently?       Work and family stress 11. PATIENT SUPPORT: Who is with you now? Who do you live with? Do you have family or friends who you can talk to?        wife 52. OTHER SYMPTOMS: Do you have any other symptoms? (e.g., feeling depressed, trouble concentrating, trouble sleeping, trouble breathing, palpitations or fast heartbeat, chest pain, sweating, nausea, or diarrhea)       Fast heart rate   Pt called 911 last night due to arm pain. Pt was concerned of heart attack. EMS told pt his vital were stable and this was likely a panic attack. Pt stated he does have anxiety and it is worsening over time. Pt is currently anxious. [facility] notified and pt was able to be seen today.  Protocols used: Anxiety and Panic Attack-A-AH

## 2024-05-07 NOTE — Assessment & Plan Note (Signed)
 EKG on patient's phone revealed a rapid heart rate of 120 with regular rhythm.  Advised patient that if he experiences another similar episode of rapid heartbeat/chest pain, to go to the ER for further evaluation.  Patient verbalized understanding.  Also offered to order another Holter monitor to ensure that rapid heart rate not related to arrhythmia.  Patient declined Holter monitor at this time.  Have made above adjustments to anxiety medicine.  Will continue to monitor.

## 2024-05-07 NOTE — Progress Notes (Signed)
 Established Patient Office Visit  Subjective   Patient ID: Philip Daugherty, male    DOB: 06-20-1976  Age: 48 y.o. MRN: [id number]  Chief Complaint  Patient presents with   Medical Management of Chronic Issues    HPI  Philip Daugherty is a 48 y.o. y/o male who presents to the clinic today for acute anxiety attack.  Patient reports that he had a fairly normal day at work yesterday, and he got home from work and was watching a movie on TV when he suddenly began to experience a rapid heart rate, which prompted him to call EMS as he thought he was having heart attack.  EKG and heart rate monitoring on his smart watch revealed a heart rate around 120 with regular rhythm.  Patient reports that after EMS left, the racing heart and chest pain slowly went away.  He does report that 30 minutes before this occurred, he did take a delta 8 gummy which he reports normally helps with his anxiety, however EMS informed him that the substances can lead to rapid heart rate.  Patient wanted to be evaluated today and see if adjustments can be made to his current anxiety medication regimen.  Reports today that he feels his mood is stable.  Does report that due to some increased stress recently, he feels that his anxiety was headed in the wrong direction, but before this stress came on, he feels that the medication is helping him. Reports that he has never had a racing heart episode like this in the past.  Reports that several years ago he did wear Holter monitor for intermittent chest pains which was unremarkable.  Denies hemoptysis, leg swelling, current chest pain.  Denies SI/HI.  Of note, patient also takes Adderall XR 40 mg daily as needed for ADHD.  Patient reports that he did take this medication early yesterday morning, however did not take any extra doses that may have preceded this event.  Reports he is tolerating this medication well and denies side effects.    ROS Per HPI.    Objective:     BP 111/71    Pulse 94   Temp 97.8 F (36.6 C) (Oral)   Ht 6' 1 (1.854 m)   Wt 274 lb 1.9 oz (124.3 kg)   SpO2 96%   BMI 36.17 kg/m    Physical Exam Constitutional:      General: He is not in acute distress.    Appearance: Normal appearance.  Cardiovascular:     Rate and Rhythm: Normal rate and regular rhythm.     Heart sounds: Normal heart sounds. No murmur heard.    No friction rub. No gallop.  Pulmonary:     Effort: Pulmonary effort is normal. No respiratory distress.     Breath sounds: Normal breath sounds.  Musculoskeletal:        General: No swelling.  Skin:    General: Skin is warm and dry.  Neurological:     General: No focal deficit present.     Mental Status: He is alert.  Psychiatric:        Mood and Affect: Mood normal.        Behavior: Behavior normal.        Thought Content: Thought content normal.    No results found for any visits on 05/07/24.    The 10-year ASCVD risk score (Arnett DK, et al., 2019) is: 2.4%    Assessment & Plan:   Major depressive disorder, recurrent episode,  moderate (HCC) Assessment & Plan: Continue Abilify  2.5 mg, Cymbalta  20 mg.  Have also placed referral to psychiatry for further medication management and GeneSight testing.  Patient agreeable to referral.  Will continue to monitor.  Orders: -     Ambulatory referral to Psychiatry  Attention deficit disorder (ADD) in adult -     Ambulatory referral to Psychiatry  GAD (generalized anxiety disorder) Assessment & Plan: PHQ-9 score: 9, GAD-7 score: 14.  Suspect that singular episode of rapid heart rate likely related to anxiety and/or side effect of the delta 8 gummy he took 30 minutes before.  Continue Abilify  2.5 mg daily, Cymbalta  20 mg daily.  Have increased his buspirone  from 7.5 mg twice daily to 10 mg twice daily.  Advised patient that if he tolerates the increased dose of buspirone  well after 7 days, he may increase to 3 times daily if needed.  Have also sent in an increased dose  of his hydroxyzine  from 25 mg to 50 mg to take as needed for acute anxiety.  Will follow-up on anxiety in 3 months.  Orders: -     Ambulatory referral to Psychiatry  Attention deficit disorder (ADD) without hyperactivity Assessment & Plan: Symptoms stable and well-controlled.  Continue Adderall XR 40 mg daily as needed for ADHD.  PDMP reviewed, no aberrancies.  Three 30-day prescriptions sent to pharmacy today.  Will plan to follow-up again on ADHD in 3 months.   Rapid heart rate Assessment & Plan: EKG on patient's phone revealed a rapid heart rate of 120 with regular rhythm.  Advised patient that if he experiences another similar episode of rapid heartbeat/chest pain, to go to the ER for further evaluation.  Patient verbalized understanding.  Also offered to order another Holter monitor to ensure that rapid heart rate not related to arrhythmia.  Patient declined Holter monitor at this time.  Have made above adjustments to anxiety medicine.  Will continue to monitor.   Other orders -     hydrOXYzine  HCl; Take 1 tablet (50 mg total) by mouth 3 (three) times daily as needed.  Dispense: 30 tablet; Refill: 0 -     busPIRone  HCl; Take 1 tablet (10 mg total) by mouth 2 (two) times daily.  Dispense: 60 each; Refill: 2 -     Amphetamine -Dextroamphet ER; Take 2 capsules (40 mg total) by mouth daily.  Dispense: 60 capsule; Refill: 0 -     Amphetamine -Dextroamphet ER; Take 2 capsules (40 mg total) by mouth daily.  Dispense: 60 capsule; Refill: 0 -     Amphetamine -Dextroamphet ER; Take 2 capsules (40 mg total) by mouth daily.  Dispense: 60 capsule; Refill: 0    Return in about 3 months (around 08/07/2024) for Mood, ADHD.    Saddie JULIANNA Sacks, PA-C

## 2024-05-08 ENCOUNTER — Other Ambulatory Visit: Payer: Self-pay | Admitting: Family Medicine

## 2024-05-11 DIAGNOSIS — F4323 Adjustment disorder with mixed anxiety and depressed mood: Secondary | ICD-10-CM | POA: Diagnosis not present

## 2024-05-18 DIAGNOSIS — F4323 Adjustment disorder with mixed anxiety and depressed mood: Secondary | ICD-10-CM | POA: Diagnosis not present

## 2024-06-01 DIAGNOSIS — F4323 Adjustment disorder with mixed anxiety and depressed mood: Secondary | ICD-10-CM | POA: Diagnosis not present

## 2024-06-03 ENCOUNTER — Other Ambulatory Visit: Payer: Self-pay

## 2024-06-03 DIAGNOSIS — M7741 Metatarsalgia, right foot: Secondary | ICD-10-CM | POA: Diagnosis not present

## 2024-06-04 ENCOUNTER — Ambulatory Visit: Admitting: Family Medicine

## 2024-06-08 DIAGNOSIS — F4323 Adjustment disorder with mixed anxiety and depressed mood: Secondary | ICD-10-CM | POA: Diagnosis not present

## 2024-06-23 DIAGNOSIS — F4323 Adjustment disorder with mixed anxiety and depressed mood: Secondary | ICD-10-CM | POA: Diagnosis not present

## 2024-07-13 ENCOUNTER — Other Ambulatory Visit: Payer: Self-pay | Admitting: Family Medicine

## 2024-07-28 DIAGNOSIS — F4323 Adjustment disorder with mixed anxiety and depressed mood: Secondary | ICD-10-CM | POA: Diagnosis not present

## 2024-08-09 ENCOUNTER — Other Ambulatory Visit: Payer: Self-pay | Admitting: Medical Genetics

## 2024-08-09 DIAGNOSIS — Z006 Encounter for examination for normal comparison and control in clinical research program: Secondary | ICD-10-CM

## 2024-08-10 ENCOUNTER — Ambulatory Visit: Admitting: Family Medicine

## 2024-08-13 ENCOUNTER — Other Ambulatory Visit: Payer: Self-pay

## 2024-08-31 ENCOUNTER — Telehealth: Admitting: Physician Assistant

## 2024-08-31 DIAGNOSIS — J208 Acute bronchitis due to other specified organisms: Secondary | ICD-10-CM

## 2024-08-31 MED ORDER — ALBUTEROL SULFATE HFA 108 (90 BASE) MCG/ACT IN AERS: 1.0000 | INHALATION_SPRAY | Freq: Four times a day (QID) | RESPIRATORY_TRACT | 0 refills | Status: DC | PRN

## 2024-08-31 MED ORDER — PREDNISONE 20 MG PO TABS: 40.0000 mg | ORAL_TABLET | Freq: Every day | ORAL | 0 refills | Status: DC

## 2024-08-31 MED ORDER — BENZONATATE 100 MG PO CAPS: 100.0000 mg | ORAL_CAPSULE | Freq: Three times a day (TID) | ORAL | 0 refills | Status: AC | PRN

## 2024-08-31 NOTE — Progress Notes (Signed)
 We are sorry that you are not feeling well.  Here is how we plan to help!  Based on your presentation I believe you most likely have A cough due to a virus.  This is called viral bronchitis and is best treated by rest, plenty of fluids and control of the cough.  You may use Ibuprofen or Tylenol  as directed to help your symptoms.     In addition you may use A non-prescription cough medication called Mucinex DM: take 2 tablets every 12 hours. and A prescription cough medication called Tessalon  Perles 100mg . You may take 1-2 capsules every 8 hours as needed for your cough.  I have prescribed Prednisone  20mg  Take 2 tablets (40mg ) daily for 5 days and Albuterol  inhaler Use 1-2 puffs every 6 hours as needed for shortness of breath, chest tightness, and/or wheezing.  From your responses in the eVisit questionnaire you describe inflammation in the upper respiratory tract which is causing a significant cough.  This is commonly called Bronchitis and has four common causes:   Allergies Viral Infections Acid Reflux Bacterial Infection Allergies, viruses and acid reflux are treated by controlling symptoms or eliminating the cause. An example might be a cough caused by taking certain blood pressure medications. You stop the cough by changing the medication. Another example might be a cough caused by acid reflux. Controlling the reflux helps control the cough.  USE OF BRONCHODILATOR (RESCUE) INHALERS: There is a risk from using your bronchodilator too frequently.  The risk is that over-reliance on a medication which only relaxes the muscles surrounding the breathing tubes can reduce the effectiveness of medications prescribed to reduce swelling and congestion of the tubes themselves.  Although you feel brief relief from the bronchodilator inhaler, your asthma may actually be worsening with the tubes becoming more swollen and filled with mucus.  This can delay other crucial treatments, such as oral steroid  medications. If you need to use a bronchodilator inhaler daily, several times per day, you should discuss this with your provider.  There are probably better treatments that could be used to keep your asthma under control.     HOME CARE Only take medications as instructed by your medical team. Complete the entire course of an antibiotic. Drink plenty of fluids and get plenty of rest. Avoid close contacts especially the very young and the elderly Cover your mouth if you cough or cough into your sleeve. Always remember to wash your hands A steam or ultrasonic humidifier can help congestion.   GET HELP RIGHT AWAY IF: You develop worsening fever. You become short of breath You cough up blood. Your symptoms persist after you have completed your treatment plan MAKE SURE YOU  Understand these instructions. Will watch your condition. Will get help right away if you are not doing well or get worse.  Your e-visit answers were reviewed by a board certified advanced clinical practitioner to complete your personal care plan.  Depending on the condition, your plan could have included both over the counter or prescription medications. If there is a problem please reply  once you have received a response from your provider. Your safety is important to us .  If you have drug allergies check your prescription carefully.    You can use MyChart to ask questions about today's visit, request a non-urgent call back, or ask for a work or school excuse for 24 hours related to this e-Visit. If it has been greater than 24 hours you will need to follow up  with your provider, or enter a new e-Visit to address those concerns. You will get an e-mail in the next two days asking about your experience.  I hope that your e-visit has been valuable and will speed your recovery. Thank you for using e-visits.   I have spent 5 minutes in review of e-visit questionnaire, review and updating patient chart, medical decision making  and response to patient.   Delon CHRISTELLA Dickinson, PA-C

## 2024-09-14 ENCOUNTER — Other Ambulatory Visit: Payer: Self-pay | Admitting: Family Medicine

## 2024-09-14 DIAGNOSIS — N529 Male erectile dysfunction, unspecified: Secondary | ICD-10-CM

## 2024-09-17 ENCOUNTER — Ambulatory Visit: Admitting: Family Medicine

## 2024-09-22 ENCOUNTER — Encounter: Payer: Self-pay | Admitting: Family Medicine

## 2024-09-22 ENCOUNTER — Ambulatory Visit: Admitting: Family Medicine

## 2024-09-22 DIAGNOSIS — J069 Acute upper respiratory infection, unspecified: Secondary | ICD-10-CM | POA: Diagnosis not present

## 2024-09-22 DIAGNOSIS — F331 Major depressive disorder, recurrent, moderate: Secondary | ICD-10-CM | POA: Diagnosis not present

## 2024-09-22 DIAGNOSIS — F988 Other specified behavioral and emotional disorders with onset usually occurring in childhood and adolescence: Secondary | ICD-10-CM | POA: Diagnosis not present

## 2024-09-22 NOTE — Patient Instructions (Signed)
 It was nice to see you today,  We addressed the following topics today: - You can stop taking the aripiprazole  2.5 mg. You do not need to taper it further, but if you prefer, you can take it every other day for a week or two before stopping completely. If you feel you need to restart it later, you can begin again at the 2.5 mg dose with the extras you have. - I will send the prescription for Vyvanse  to your pharmacy. - Please schedule a follow-up appointment for a physical in about three months. We will do your annual lab work at that time.  Have a great day,  Rolan Slain, MD

## 2024-09-22 NOTE — Progress Notes (Unsigned)
 Established Patient Office Visit  Subjective   Patient ID: Philip Daugherty, male    DOB: 01/26/1976  Age: 48 y.o. MRN: [id number]  Chief Complaint  Patient presents with   Medical Management of Chronic Issues    HPI  Subjective - Reports a minor, lingering cough for about one month. Started after a viral-like illness with a runny nose. It is now a dry cough. A previous e-visit resulted in prescriptions for prednisone  and benzonatate , which provided some improvement, but the cough has not fully resolved. The cough is triggered by laughing too hard. Denies it is a major issue but is concerned about it lingering. - Discussed medication management. Reports being on Adderall for six months and requests to switch back to Vyvanse , as rotating between the two every six months seems to maintain efficacy. Reports splitting the 5 mg aripiprazole  (Abilify ) to 2.5 mg daily as previously advised. Expresses a desire to discontinue aripiprazole  to reduce the total number of medications. Reports taking bupropion , which is working well. Takes buspirone  twice a day. Also takes duloxetine  (Cymbalta ). Is finishing the last few doses of benzonatate  (Tessalon  Perles) for the cough. Reports the dose of testosterone  was reduced to 1 mL weekly.  Medications Current medications include Adderall (to be switched to Vyvanse ), aripiprazole  2.5mg  daily (to be discontinued), bupropion , buspirone  BID, duloxetine , testosterone  1 mL weekly. Currently finishing a course of benzonatate . Previously took a course of prednisone .  PMH, PSH, FH, Social Hx PMHx: Acid reflux.  ROS Constitutional: Denies significant issues. Respiratory: Reports a persistent dry cough post-viral illness. Denies postnasal drip or sinus issues since taking prednisone . GI: Reports some acid reflux.  Objective PSYCH: Mood appears stable. Reports doing well overall.  Assessment and Plan Post-viral cough - Presents with a cough lasting approximately  one month, which began after a viral infection. It has partially improved with prednisone  and benzonatate . The cough is dry and triggered by laughing. Denies other associated symptoms. Acknowledges that post-viral cough can last 4-6 weeks or longer. Also discussed other potential contributors like seasonal allergies and acid reflux. - Continue monitoring symptoms. - If cough persists or worsens in the next month, recommend a follow-up message or visit.  Medication Management - Currently taking multiple psychotropic medications for mood and attention. Reports good effect from bupropion . Wishes to reduce the total number of medications. - Discontinue aripiprazole  2.5 mg. Counseled that tapering further is not required, but can take every other day for a week or two if preferred before stopping completely. Advised that it can be restarted at 2.5 mg if symptoms return. - Switch Adderall to Vyvanse  as requested for efficacy. - Continue bupropion , buspirone , and duloxetine  at current doses. - Continue testosterone  1 mL weekly. - Plan for a physical exam with lab work, including testosterone  level, in approximately three months (around February 2026).     The 10-year ASCVD risk score (Arnett DK, et al., 2019) is: 3.8%  Health Maintenance Due  Topic Date Due   Hepatitis C Screening  Never done   Hepatitis B Vaccines 19-59 Average Risk (1 of 3 - 19+ 3-dose series) Never done   DTaP/Tdap/Td (2 - Td or Tdap) 01/24/2022   COVID-19 Vaccine (3 - 2025-26 season) 06/15/2024      Objective:     BP 139/83   Pulse 100   Ht 6' 1 (1.854 m)   Wt 285 lb 6.4 oz (129.5 kg)   SpO2 95%   BMI 37.65 kg/m  {Vitals History (Optional):23777}  Physical Exam  No results found for any visits on 09/22/24.      Assessment & Plan:   There are no diagnoses linked to this encounter.   No follow-ups on file.    Toribio MARLA Slain, MD

## 2024-09-23 LAB — GENECONNECT MOLECULAR SCREEN: Genetic Analysis Overall Interpretation: NEGATIVE

## 2024-09-26 DIAGNOSIS — J069 Acute upper respiratory infection, unspecified: Secondary | ICD-10-CM | POA: Insufficient documentation

## 2024-09-26 MED ORDER — LISDEXAMFETAMINE DIMESYLATE 50 MG PO CAPS: 50.0000 mg | ORAL_CAPSULE | Freq: Every day | ORAL | 0 refills | Status: AC

## 2024-09-26 NOTE — Assessment & Plan Note (Signed)
-   Discontinue aripiprazole  2.5 mg. Counseled that tapering further is not required, but can take every other day for a week or two if preferred before stopping completely. Advised that it can be restarted at 2.5 mg if symptoms return. - Continue bupropion , buspirone , and duloxetine  at current doses.

## 2024-09-26 NOTE — Assessment & Plan Note (Signed)
-   Presents with a cough lasting approximately one month, which began after a viral infection. It has partially improved with prednisone  and benzonatate . The cough is dry and triggered by laughing. Denies other associated symptoms. Acknowledges that post-viral cough can last 4-6 weeks or longer. Also discussed other potential contributors like seasonal allergies and acid reflux. - Continue monitoring symptoms. - If cough persists or worsens in the next month, recommend a follow-up message or visit.

## 2024-09-26 NOTE — Assessment & Plan Note (Signed)
-   Switch Adderall to Vyvanse  as requested for efficacy.

## 2024-10-02 ENCOUNTER — Encounter: Payer: Self-pay | Admitting: Family Medicine

## 2024-10-05 ENCOUNTER — Other Ambulatory Visit: Payer: Self-pay | Admitting: Family Medicine

## 2024-10-16 ENCOUNTER — Other Ambulatory Visit: Payer: Self-pay | Admitting: Family Medicine

## 2024-11-11 ENCOUNTER — Other Ambulatory Visit: Payer: Self-pay | Admitting: Family Medicine

## 2024-12-14 ENCOUNTER — Other Ambulatory Visit

## 2024-12-21 ENCOUNTER — Encounter: Admitting: Family Medicine
